# Patient Record
Sex: Male | Born: 1940
Health system: Southern US, Community
[De-identification: ages and names within clinical notes are randomized; demographics above are authoritative.]

## PROBLEM LIST (undated history)

## (undated) DIAGNOSIS — I5032 Chronic diastolic (congestive) heart failure: Secondary | ICD-10-CM

## (undated) DIAGNOSIS — I219 Acute myocardial infarction, unspecified: Secondary | ICD-10-CM

## (undated) DIAGNOSIS — R51 Headache: Secondary | ICD-10-CM

## (undated) DIAGNOSIS — J189 Pneumonia, unspecified organism: Secondary | ICD-10-CM

## (undated) DIAGNOSIS — E8770 Fluid overload, unspecified: Secondary | ICD-10-CM

## (undated) DIAGNOSIS — E118 Type 2 diabetes mellitus with unspecified complications: Secondary | ICD-10-CM

## (undated) DIAGNOSIS — Z7901 Long term (current) use of anticoagulants: Secondary | ICD-10-CM

## (undated) DIAGNOSIS — Z955 Presence of coronary angioplasty implant and graft: Secondary | ICD-10-CM

## (undated) DIAGNOSIS — R519 Headache, unspecified: Secondary | ICD-10-CM

## (undated) DIAGNOSIS — I110 Hypertensive heart disease with heart failure: Secondary | ICD-10-CM

## (undated) DIAGNOSIS — N184 Chronic kidney disease, stage 4 (severe): Secondary | ICD-10-CM

## (undated) DIAGNOSIS — E1165 Type 2 diabetes mellitus with hyperglycemia: Secondary | ICD-10-CM

## (undated) DIAGNOSIS — K219 Gastro-esophageal reflux disease without esophagitis: Secondary | ICD-10-CM

## (undated) DIAGNOSIS — D649 Anemia, unspecified: Secondary | ICD-10-CM

## (undated) DIAGNOSIS — I10 Essential (primary) hypertension: Secondary | ICD-10-CM

## (undated) DIAGNOSIS — IMO0002 Reserved for concepts with insufficient information to code with codable children: Secondary | ICD-10-CM

## (undated) DIAGNOSIS — F418 Other specified anxiety disorders: Secondary | ICD-10-CM

## (undated) DIAGNOSIS — Z951 Presence of aortocoronary bypass graft: Secondary | ICD-10-CM

## (undated) DIAGNOSIS — R112 Nausea with vomiting, unspecified: Secondary | ICD-10-CM

## (undated) DIAGNOSIS — Z9981 Dependence on supplemental oxygen: Secondary | ICD-10-CM

## (undated) DIAGNOSIS — E1100 Type 2 diabetes mellitus with hyperosmolarity without nonketotic hyperglycemic-hyperosmolar coma (NKHHC): Secondary | ICD-10-CM

## (undated) DIAGNOSIS — I25119 Atherosclerotic heart disease of native coronary artery with unspecified angina pectoris: Secondary | ICD-10-CM

## (undated) DIAGNOSIS — G2581 Restless legs syndrome: Secondary | ICD-10-CM

## (undated) DIAGNOSIS — Z9889 Other specified postprocedural states: Secondary | ICD-10-CM

## (undated) DIAGNOSIS — E785 Hyperlipidemia, unspecified: Secondary | ICD-10-CM

## (undated) DIAGNOSIS — I4891 Unspecified atrial fibrillation: Secondary | ICD-10-CM

## (undated) DIAGNOSIS — I214 Non-ST elevation (NSTEMI) myocardial infarction: Secondary | ICD-10-CM

## (undated) DIAGNOSIS — J449 Chronic obstructive pulmonary disease, unspecified: Secondary | ICD-10-CM

## (undated) DIAGNOSIS — J9601 Acute respiratory failure with hypoxia: Secondary | ICD-10-CM

## (undated) DIAGNOSIS — N179 Acute kidney failure, unspecified: Secondary | ICD-10-CM

## (undated) DIAGNOSIS — I340 Nonrheumatic mitral (valve) insufficiency: Secondary | ICD-10-CM

## (undated) DIAGNOSIS — I5042 Chronic combined systolic (congestive) and diastolic (congestive) heart failure: Secondary | ICD-10-CM

## (undated) DIAGNOSIS — E0822 Diabetes mellitus due to underlying condition with diabetic chronic kidney disease: Secondary | ICD-10-CM

## (undated) DIAGNOSIS — M109 Gout, unspecified: Secondary | ICD-10-CM

## (undated) DIAGNOSIS — N400 Enlarged prostate without lower urinary tract symptoms: Secondary | ICD-10-CM

## (undated) DIAGNOSIS — M1990 Unspecified osteoarthritis, unspecified site: Secondary | ICD-10-CM

## (undated) DIAGNOSIS — I2510 Atherosclerotic heart disease of native coronary artery without angina pectoris: Secondary | ICD-10-CM

## (undated) HISTORY — DX: Hypertensive heart disease with heart failure: I11.0

## (undated) HISTORY — DX: Reserved for concepts with insufficient information to code with codable children: IMO0002

## (undated) HISTORY — DX: Acute respiratory failure with hypoxia: J96.01

## (undated) HISTORY — DX: Presence of coronary angioplasty implant and graft: Z95.5

## (undated) HISTORY — DX: Non-ST elevation (NSTEMI) myocardial infarction: I21.4

## (undated) HISTORY — DX: Hyperlipidemia, unspecified: E78.5

## (undated) HISTORY — DX: Pneumonia, unspecified organism: J18.9

## (undated) HISTORY — DX: Acute kidney failure, unspecified: N17.9

## (undated) HISTORY — PX: CATARACT EXTRACTION W/ INTRAOCULAR LENS  IMPLANT, BILATERAL: SHX1307

## (undated) HISTORY — DX: Type 2 diabetes mellitus with hyperglycemia: E11.65

## (undated) HISTORY — DX: Chronic kidney disease, stage 4 (severe): N18.4

## (undated) HISTORY — DX: Fluid overload, unspecified: E87.70

## (undated) HISTORY — DX: Atherosclerotic heart disease of native coronary artery with unspecified angina pectoris: I25.119

## (undated) HISTORY — PX: CORONARY ANGIOPLASTY WITH STENT PLACEMENT: SHX49

## (undated) HISTORY — DX: Chronic diastolic (congestive) heart failure: I50.32

## (undated) HISTORY — DX: Long term (current) use of anticoagulants: Z79.01

## (undated) HISTORY — DX: Presence of aortocoronary bypass graft: Z95.1

## (undated) HISTORY — PX: CORONARY ARTERY BYPASS GRAFT: SHX141

## (undated) HISTORY — DX: Type 2 diabetes mellitus with unspecified complications: E11.8

## (undated) HISTORY — PX: COLONOSCOPY: SHX174

---

## 2012-03-23 DIAGNOSIS — Z955 Presence of coronary angioplasty implant and graft: Secondary | ICD-10-CM

## 2012-03-23 DIAGNOSIS — Z951 Presence of aortocoronary bypass graft: Secondary | ICD-10-CM

## 2012-03-23 HISTORY — DX: Presence of coronary angioplasty implant and graft: Z95.5

## 2012-03-23 HISTORY — DX: Presence of aortocoronary bypass graft: Z95.1

## 2012-03-24 DIAGNOSIS — I509 Heart failure, unspecified: Secondary | ICD-10-CM | POA: Insufficient documentation

## 2015-09-17 DIAGNOSIS — M25652 Stiffness of left hip, not elsewhere classified: Secondary | ICD-10-CM | POA: Diagnosis not present

## 2015-09-17 DIAGNOSIS — M5489 Other dorsalgia: Secondary | ICD-10-CM | POA: Diagnosis not present

## 2015-09-17 DIAGNOSIS — G2581 Restless legs syndrome: Secondary | ICD-10-CM | POA: Diagnosis not present

## 2015-09-17 DIAGNOSIS — M25551 Pain in right hip: Secondary | ICD-10-CM | POA: Diagnosis not present

## 2015-09-20 DIAGNOSIS — I2510 Atherosclerotic heart disease of native coronary artery without angina pectoris: Secondary | ICD-10-CM | POA: Diagnosis not present

## 2015-09-20 DIAGNOSIS — Z6827 Body mass index (BMI) 27.0-27.9, adult: Secondary | ICD-10-CM | POA: Diagnosis not present

## 2015-09-20 DIAGNOSIS — I509 Heart failure, unspecified: Secondary | ICD-10-CM | POA: Diagnosis not present

## 2015-09-20 DIAGNOSIS — D649 Anemia, unspecified: Secondary | ICD-10-CM | POA: Diagnosis not present

## 2015-09-20 DIAGNOSIS — I10 Essential (primary) hypertension: Secondary | ICD-10-CM | POA: Diagnosis not present

## 2015-09-20 DIAGNOSIS — N189 Chronic kidney disease, unspecified: Secondary | ICD-10-CM | POA: Diagnosis not present

## 2015-09-20 DIAGNOSIS — E785 Hyperlipidemia, unspecified: Secondary | ICD-10-CM | POA: Diagnosis not present

## 2015-09-20 DIAGNOSIS — I4891 Unspecified atrial fibrillation: Secondary | ICD-10-CM | POA: Diagnosis not present

## 2015-09-20 DIAGNOSIS — R0602 Shortness of breath: Secondary | ICD-10-CM | POA: Diagnosis not present

## 2015-09-20 DIAGNOSIS — N401 Enlarged prostate with lower urinary tract symptoms: Secondary | ICD-10-CM | POA: Diagnosis not present

## 2015-09-20 DIAGNOSIS — E119 Type 2 diabetes mellitus without complications: Secondary | ICD-10-CM | POA: Diagnosis not present

## 2015-09-24 DIAGNOSIS — I4891 Unspecified atrial fibrillation: Secondary | ICD-10-CM | POA: Diagnosis not present

## 2015-09-24 DIAGNOSIS — I509 Heart failure, unspecified: Secondary | ICD-10-CM | POA: Diagnosis not present

## 2015-09-24 DIAGNOSIS — G2581 Restless legs syndrome: Secondary | ICD-10-CM | POA: Diagnosis not present

## 2015-09-24 DIAGNOSIS — N401 Enlarged prostate with lower urinary tract symptoms: Secondary | ICD-10-CM | POA: Diagnosis not present

## 2015-09-24 DIAGNOSIS — J189 Pneumonia, unspecified organism: Secondary | ICD-10-CM | POA: Diagnosis not present

## 2015-09-24 DIAGNOSIS — I10 Essential (primary) hypertension: Secondary | ICD-10-CM | POA: Diagnosis not present

## 2015-09-24 DIAGNOSIS — B349 Viral infection, unspecified: Secondary | ICD-10-CM | POA: Diagnosis not present

## 2015-09-24 DIAGNOSIS — E119 Type 2 diabetes mellitus without complications: Secondary | ICD-10-CM | POA: Diagnosis not present

## 2015-09-24 DIAGNOSIS — N189 Chronic kidney disease, unspecified: Secondary | ICD-10-CM | POA: Diagnosis not present

## 2015-09-24 DIAGNOSIS — D649 Anemia, unspecified: Secondary | ICD-10-CM | POA: Diagnosis not present

## 2015-09-25 DIAGNOSIS — I252 Old myocardial infarction: Secondary | ICD-10-CM | POA: Diagnosis not present

## 2015-09-25 DIAGNOSIS — Z951 Presence of aortocoronary bypass graft: Secondary | ICD-10-CM | POA: Diagnosis not present

## 2015-09-25 DIAGNOSIS — R0902 Hypoxemia: Secondary | ICD-10-CM | POA: Diagnosis not present

## 2015-09-25 DIAGNOSIS — I249 Acute ischemic heart disease, unspecified: Secondary | ICD-10-CM | POA: Diagnosis not present

## 2015-09-25 DIAGNOSIS — N179 Acute kidney failure, unspecified: Secondary | ICD-10-CM | POA: Diagnosis not present

## 2015-09-25 DIAGNOSIS — R509 Fever, unspecified: Secondary | ICD-10-CM | POA: Diagnosis not present

## 2015-09-25 DIAGNOSIS — E7800 Pure hypercholesterolemia, unspecified: Secondary | ICD-10-CM | POA: Diagnosis not present

## 2015-09-25 DIAGNOSIS — I161 Hypertensive emergency: Secondary | ICD-10-CM | POA: Diagnosis not present

## 2015-09-25 DIAGNOSIS — K219 Gastro-esophageal reflux disease without esophagitis: Secondary | ICD-10-CM | POA: Diagnosis not present

## 2015-09-25 DIAGNOSIS — I509 Heart failure, unspecified: Secondary | ICD-10-CM | POA: Diagnosis not present

## 2015-09-25 DIAGNOSIS — B349 Viral infection, unspecified: Secondary | ICD-10-CM | POA: Diagnosis not present

## 2015-09-25 DIAGNOSIS — E1165 Type 2 diabetes mellitus with hyperglycemia: Secondary | ICD-10-CM | POA: Diagnosis not present

## 2015-09-25 DIAGNOSIS — N400 Enlarged prostate without lower urinary tract symptoms: Secondary | ICD-10-CM | POA: Diagnosis not present

## 2015-09-25 DIAGNOSIS — R079 Chest pain, unspecified: Secondary | ICD-10-CM | POA: Diagnosis not present

## 2015-09-25 DIAGNOSIS — I10 Essential (primary) hypertension: Secondary | ICD-10-CM | POA: Diagnosis not present

## 2015-09-25 DIAGNOSIS — M1990 Unspecified osteoarthritis, unspecified site: Secondary | ICD-10-CM | POA: Diagnosis not present

## 2015-09-25 DIAGNOSIS — I25709 Atherosclerosis of coronary artery bypass graft(s), unspecified, with unspecified angina pectoris: Secondary | ICD-10-CM | POA: Diagnosis not present

## 2015-09-25 DIAGNOSIS — J449 Chronic obstructive pulmonary disease, unspecified: Secondary | ICD-10-CM | POA: Diagnosis not present

## 2015-09-25 DIAGNOSIS — Z79899 Other long term (current) drug therapy: Secondary | ICD-10-CM | POA: Diagnosis not present

## 2015-09-25 DIAGNOSIS — E785 Hyperlipidemia, unspecified: Secondary | ICD-10-CM | POA: Diagnosis not present

## 2015-09-26 DIAGNOSIS — R05 Cough: Secondary | ICD-10-CM | POA: Diagnosis not present

## 2015-09-26 DIAGNOSIS — R509 Fever, unspecified: Secondary | ICD-10-CM | POA: Diagnosis not present

## 2015-09-26 DIAGNOSIS — I161 Hypertensive emergency: Secondary | ICD-10-CM | POA: Diagnosis not present

## 2015-09-26 DIAGNOSIS — R0902 Hypoxemia: Secondary | ICD-10-CM | POA: Diagnosis not present

## 2015-09-26 DIAGNOSIS — B349 Viral infection, unspecified: Secondary | ICD-10-CM | POA: Diagnosis not present

## 2015-09-26 DIAGNOSIS — I249 Acute ischemic heart disease, unspecified: Secondary | ICD-10-CM | POA: Diagnosis not present

## 2015-09-27 DIAGNOSIS — E1165 Type 2 diabetes mellitus with hyperglycemia: Secondary | ICD-10-CM | POA: Diagnosis not present

## 2015-09-27 DIAGNOSIS — R079 Chest pain, unspecified: Secondary | ICD-10-CM | POA: Diagnosis not present

## 2015-09-27 DIAGNOSIS — R509 Fever, unspecified: Secondary | ICD-10-CM | POA: Diagnosis not present

## 2015-09-27 DIAGNOSIS — I10 Essential (primary) hypertension: Secondary | ICD-10-CM | POA: Diagnosis not present

## 2015-09-28 DIAGNOSIS — I25119 Atherosclerotic heart disease of native coronary artery with unspecified angina pectoris: Secondary | ICD-10-CM | POA: Diagnosis not present

## 2015-09-28 DIAGNOSIS — R079 Chest pain, unspecified: Secondary | ICD-10-CM | POA: Diagnosis not present

## 2015-09-28 DIAGNOSIS — I517 Cardiomegaly: Secondary | ICD-10-CM | POA: Diagnosis not present

## 2015-09-28 DIAGNOSIS — I340 Nonrheumatic mitral (valve) insufficiency: Secondary | ICD-10-CM | POA: Diagnosis not present

## 2015-09-28 DIAGNOSIS — N179 Acute kidney failure, unspecified: Secondary | ICD-10-CM | POA: Diagnosis not present

## 2015-09-28 DIAGNOSIS — I10 Essential (primary) hypertension: Secondary | ICD-10-CM | POA: Diagnosis not present

## 2015-09-28 DIAGNOSIS — E1165 Type 2 diabetes mellitus with hyperglycemia: Secondary | ICD-10-CM | POA: Diagnosis not present

## 2015-09-28 DIAGNOSIS — R509 Fever, unspecified: Secondary | ICD-10-CM | POA: Diagnosis not present

## 2015-10-03 DIAGNOSIS — N401 Enlarged prostate with lower urinary tract symptoms: Secondary | ICD-10-CM | POA: Diagnosis not present

## 2015-10-03 DIAGNOSIS — I509 Heart failure, unspecified: Secondary | ICD-10-CM | POA: Diagnosis not present

## 2015-10-03 DIAGNOSIS — E119 Type 2 diabetes mellitus without complications: Secondary | ICD-10-CM | POA: Diagnosis not present

## 2015-10-03 DIAGNOSIS — J189 Pneumonia, unspecified organism: Secondary | ICD-10-CM | POA: Diagnosis not present

## 2015-10-03 DIAGNOSIS — I10 Essential (primary) hypertension: Secondary | ICD-10-CM | POA: Diagnosis not present

## 2015-10-03 DIAGNOSIS — M109 Gout, unspecified: Secondary | ICD-10-CM | POA: Diagnosis not present

## 2015-10-03 DIAGNOSIS — I4891 Unspecified atrial fibrillation: Secondary | ICD-10-CM | POA: Diagnosis not present

## 2015-10-03 DIAGNOSIS — Z6827 Body mass index (BMI) 27.0-27.9, adult: Secondary | ICD-10-CM | POA: Diagnosis not present

## 2015-10-03 DIAGNOSIS — N189 Chronic kidney disease, unspecified: Secondary | ICD-10-CM | POA: Diagnosis not present

## 2015-10-03 DIAGNOSIS — I2510 Atherosclerotic heart disease of native coronary artery without angina pectoris: Secondary | ICD-10-CM | POA: Diagnosis not present

## 2015-10-11 DIAGNOSIS — Z6827 Body mass index (BMI) 27.0-27.9, adult: Secondary | ICD-10-CM | POA: Diagnosis not present

## 2015-10-11 DIAGNOSIS — N189 Chronic kidney disease, unspecified: Secondary | ICD-10-CM | POA: Diagnosis not present

## 2015-10-11 DIAGNOSIS — I4891 Unspecified atrial fibrillation: Secondary | ICD-10-CM | POA: Diagnosis not present

## 2015-10-11 DIAGNOSIS — I509 Heart failure, unspecified: Secondary | ICD-10-CM | POA: Diagnosis not present

## 2015-10-11 DIAGNOSIS — I10 Essential (primary) hypertension: Secondary | ICD-10-CM | POA: Diagnosis not present

## 2015-10-11 DIAGNOSIS — M109 Gout, unspecified: Secondary | ICD-10-CM | POA: Diagnosis not present

## 2015-10-11 DIAGNOSIS — N401 Enlarged prostate with lower urinary tract symptoms: Secondary | ICD-10-CM | POA: Diagnosis not present

## 2015-12-04 DIAGNOSIS — E663 Overweight: Secondary | ICD-10-CM | POA: Diagnosis not present

## 2015-12-04 DIAGNOSIS — J301 Allergic rhinitis due to pollen: Secondary | ICD-10-CM | POA: Diagnosis not present

## 2015-12-04 DIAGNOSIS — J189 Pneumonia, unspecified organism: Secondary | ICD-10-CM | POA: Diagnosis not present

## 2015-12-04 DIAGNOSIS — J9801 Acute bronchospasm: Secondary | ICD-10-CM | POA: Diagnosis not present

## 2015-12-04 DIAGNOSIS — Z6827 Body mass index (BMI) 27.0-27.9, adult: Secondary | ICD-10-CM | POA: Diagnosis not present

## 2015-12-06 DIAGNOSIS — I2510 Atherosclerotic heart disease of native coronary artery without angina pectoris: Secondary | ICD-10-CM | POA: Diagnosis not present

## 2015-12-06 DIAGNOSIS — R0989 Other specified symptoms and signs involving the circulatory and respiratory systems: Secondary | ICD-10-CM | POA: Diagnosis not present

## 2015-12-06 DIAGNOSIS — Z7901 Long term (current) use of anticoagulants: Secondary | ICD-10-CM | POA: Diagnosis not present

## 2015-12-06 DIAGNOSIS — I129 Hypertensive chronic kidney disease with stage 1 through stage 4 chronic kidney disease, or unspecified chronic kidney disease: Secondary | ICD-10-CM | POA: Diagnosis not present

## 2015-12-06 DIAGNOSIS — I252 Old myocardial infarction: Secondary | ICD-10-CM | POA: Diagnosis not present

## 2015-12-06 DIAGNOSIS — J181 Lobar pneumonia, unspecified organism: Secondary | ICD-10-CM | POA: Diagnosis not present

## 2015-12-06 DIAGNOSIS — E1122 Type 2 diabetes mellitus with diabetic chronic kidney disease: Secondary | ICD-10-CM | POA: Diagnosis not present

## 2015-12-06 DIAGNOSIS — M1990 Unspecified osteoarthritis, unspecified site: Secondary | ICD-10-CM | POA: Diagnosis not present

## 2015-12-06 DIAGNOSIS — G2581 Restless legs syndrome: Secondary | ICD-10-CM | POA: Diagnosis not present

## 2015-12-06 DIAGNOSIS — J440 Chronic obstructive pulmonary disease with acute lower respiratory infection: Secondary | ICD-10-CM | POA: Diagnosis not present

## 2015-12-06 DIAGNOSIS — N400 Enlarged prostate without lower urinary tract symptoms: Secondary | ICD-10-CM | POA: Diagnosis not present

## 2015-12-06 DIAGNOSIS — E1165 Type 2 diabetes mellitus with hyperglycemia: Secondary | ICD-10-CM | POA: Diagnosis not present

## 2015-12-06 DIAGNOSIS — J984 Other disorders of lung: Secondary | ICD-10-CM | POA: Diagnosis not present

## 2015-12-06 DIAGNOSIS — I5031 Acute diastolic (congestive) heart failure: Secondary | ICD-10-CM | POA: Diagnosis not present

## 2015-12-06 DIAGNOSIS — I10 Essential (primary) hypertension: Secondary | ICD-10-CM | POA: Diagnosis not present

## 2015-12-06 DIAGNOSIS — M791 Myalgia: Secondary | ICD-10-CM | POA: Diagnosis not present

## 2015-12-06 DIAGNOSIS — I509 Heart failure, unspecified: Secondary | ICD-10-CM | POA: Diagnosis not present

## 2015-12-06 DIAGNOSIS — Z95818 Presence of other cardiac implants and grafts: Secondary | ICD-10-CM | POA: Diagnosis not present

## 2015-12-06 DIAGNOSIS — I5033 Acute on chronic diastolic (congestive) heart failure: Secondary | ICD-10-CM | POA: Diagnosis not present

## 2015-12-06 DIAGNOSIS — R0602 Shortness of breath: Secondary | ICD-10-CM | POA: Diagnosis not present

## 2015-12-06 DIAGNOSIS — E875 Hyperkalemia: Secondary | ICD-10-CM | POA: Diagnosis not present

## 2015-12-06 DIAGNOSIS — J9601 Acute respiratory failure with hypoxia: Secondary | ICD-10-CM | POA: Diagnosis not present

## 2015-12-06 DIAGNOSIS — D509 Iron deficiency anemia, unspecified: Secondary | ICD-10-CM | POA: Diagnosis not present

## 2015-12-06 DIAGNOSIS — N189 Chronic kidney disease, unspecified: Secondary | ICD-10-CM | POA: Diagnosis not present

## 2015-12-06 DIAGNOSIS — N179 Acute kidney failure, unspecified: Secondary | ICD-10-CM | POA: Diagnosis not present

## 2015-12-06 DIAGNOSIS — J441 Chronic obstructive pulmonary disease with (acute) exacerbation: Secondary | ICD-10-CM | POA: Diagnosis not present

## 2015-12-06 DIAGNOSIS — R0600 Dyspnea, unspecified: Secondary | ICD-10-CM | POA: Diagnosis not present

## 2015-12-06 DIAGNOSIS — E785 Hyperlipidemia, unspecified: Secondary | ICD-10-CM | POA: Diagnosis not present

## 2015-12-06 DIAGNOSIS — R918 Other nonspecific abnormal finding of lung field: Secondary | ICD-10-CM | POA: Diagnosis not present

## 2015-12-06 DIAGNOSIS — Z87891 Personal history of nicotine dependence: Secondary | ICD-10-CM | POA: Diagnosis not present

## 2015-12-06 DIAGNOSIS — I480 Paroxysmal atrial fibrillation: Secondary | ICD-10-CM | POA: Diagnosis not present

## 2015-12-06 DIAGNOSIS — Z794 Long term (current) use of insulin: Secondary | ICD-10-CM | POA: Diagnosis not present

## 2015-12-06 DIAGNOSIS — I255 Ischemic cardiomyopathy: Secondary | ICD-10-CM | POA: Diagnosis not present

## 2015-12-06 DIAGNOSIS — N289 Disorder of kidney and ureter, unspecified: Secondary | ICD-10-CM | POA: Diagnosis not present

## 2015-12-06 DIAGNOSIS — J189 Pneumonia, unspecified organism: Secondary | ICD-10-CM | POA: Diagnosis not present

## 2015-12-06 DIAGNOSIS — R7989 Other specified abnormal findings of blood chemistry: Secondary | ICD-10-CM | POA: Diagnosis not present

## 2015-12-07 DIAGNOSIS — I5033 Acute on chronic diastolic (congestive) heart failure: Secondary | ICD-10-CM | POA: Diagnosis not present

## 2015-12-07 DIAGNOSIS — R918 Other nonspecific abnormal finding of lung field: Secondary | ICD-10-CM | POA: Diagnosis not present

## 2015-12-07 DIAGNOSIS — N179 Acute kidney failure, unspecified: Secondary | ICD-10-CM | POA: Diagnosis not present

## 2015-12-07 DIAGNOSIS — J9601 Acute respiratory failure with hypoxia: Secondary | ICD-10-CM | POA: Diagnosis not present

## 2015-12-07 DIAGNOSIS — R0989 Other specified symptoms and signs involving the circulatory and respiratory systems: Secondary | ICD-10-CM | POA: Diagnosis not present

## 2015-12-07 DIAGNOSIS — J189 Pneumonia, unspecified organism: Secondary | ICD-10-CM | POA: Diagnosis not present

## 2015-12-08 DIAGNOSIS — I5033 Acute on chronic diastolic (congestive) heart failure: Secondary | ICD-10-CM | POA: Diagnosis not present

## 2015-12-08 DIAGNOSIS — J189 Pneumonia, unspecified organism: Secondary | ICD-10-CM | POA: Diagnosis not present

## 2015-12-08 DIAGNOSIS — J9601 Acute respiratory failure with hypoxia: Secondary | ICD-10-CM | POA: Diagnosis not present

## 2015-12-08 DIAGNOSIS — N179 Acute kidney failure, unspecified: Secondary | ICD-10-CM | POA: Diagnosis not present

## 2015-12-17 DIAGNOSIS — I509 Heart failure, unspecified: Secondary | ICD-10-CM | POA: Diagnosis not present

## 2015-12-17 DIAGNOSIS — E119 Type 2 diabetes mellitus without complications: Secondary | ICD-10-CM | POA: Diagnosis not present

## 2015-12-17 DIAGNOSIS — J9801 Acute bronchospasm: Secondary | ICD-10-CM | POA: Diagnosis not present

## 2015-12-17 DIAGNOSIS — G2581 Restless legs syndrome: Secondary | ICD-10-CM | POA: Diagnosis not present

## 2015-12-17 DIAGNOSIS — I4891 Unspecified atrial fibrillation: Secondary | ICD-10-CM | POA: Diagnosis not present

## 2015-12-17 DIAGNOSIS — I2510 Atherosclerotic heart disease of native coronary artery without angina pectoris: Secondary | ICD-10-CM | POA: Diagnosis not present

## 2015-12-17 DIAGNOSIS — Z6828 Body mass index (BMI) 28.0-28.9, adult: Secondary | ICD-10-CM | POA: Diagnosis not present

## 2016-01-02 DIAGNOSIS — Z6826 Body mass index (BMI) 26.0-26.9, adult: Secondary | ICD-10-CM | POA: Diagnosis not present

## 2016-01-02 DIAGNOSIS — E119 Type 2 diabetes mellitus without complications: Secondary | ICD-10-CM | POA: Diagnosis not present

## 2016-01-02 DIAGNOSIS — E785 Hyperlipidemia, unspecified: Secondary | ICD-10-CM | POA: Diagnosis not present

## 2016-01-02 DIAGNOSIS — I2510 Atherosclerotic heart disease of native coronary artery without angina pectoris: Secondary | ICD-10-CM | POA: Diagnosis not present

## 2016-01-02 DIAGNOSIS — I4891 Unspecified atrial fibrillation: Secondary | ICD-10-CM | POA: Diagnosis not present

## 2016-01-02 DIAGNOSIS — I10 Essential (primary) hypertension: Secondary | ICD-10-CM | POA: Diagnosis not present

## 2016-01-06 DIAGNOSIS — I482 Chronic atrial fibrillation: Secondary | ICD-10-CM | POA: Diagnosis not present

## 2016-01-06 DIAGNOSIS — N184 Chronic kidney disease, stage 4 (severe): Secondary | ICD-10-CM | POA: Diagnosis not present

## 2016-01-06 DIAGNOSIS — I252 Old myocardial infarction: Secondary | ICD-10-CM | POA: Diagnosis not present

## 2016-01-06 DIAGNOSIS — J189 Pneumonia, unspecified organism: Secondary | ICD-10-CM | POA: Diagnosis not present

## 2016-01-06 DIAGNOSIS — R0600 Dyspnea, unspecified: Secondary | ICD-10-CM | POA: Diagnosis not present

## 2016-01-06 DIAGNOSIS — R739 Hyperglycemia, unspecified: Secondary | ICD-10-CM | POA: Diagnosis not present

## 2016-01-06 DIAGNOSIS — R7309 Other abnormal glucose: Secondary | ICD-10-CM | POA: Diagnosis not present

## 2016-01-06 DIAGNOSIS — E1165 Type 2 diabetes mellitus with hyperglycemia: Secondary | ICD-10-CM | POA: Diagnosis not present

## 2016-01-06 DIAGNOSIS — K219 Gastro-esophageal reflux disease without esophagitis: Secondary | ICD-10-CM | POA: Diagnosis not present

## 2016-01-06 DIAGNOSIS — Z87891 Personal history of nicotine dependence: Secondary | ICD-10-CM | POA: Diagnosis not present

## 2016-01-06 DIAGNOSIS — I214 Non-ST elevation (NSTEMI) myocardial infarction: Secondary | ICD-10-CM | POA: Diagnosis not present

## 2016-01-06 DIAGNOSIS — Z951 Presence of aortocoronary bypass graft: Secondary | ICD-10-CM | POA: Diagnosis not present

## 2016-01-06 DIAGNOSIS — R0602 Shortness of breath: Secondary | ICD-10-CM | POA: Diagnosis not present

## 2016-01-06 DIAGNOSIS — R7881 Bacteremia: Secondary | ICD-10-CM | POA: Diagnosis not present

## 2016-01-06 DIAGNOSIS — I501 Left ventricular failure: Secondary | ICD-10-CM | POA: Diagnosis not present

## 2016-01-06 DIAGNOSIS — E785 Hyperlipidemia, unspecified: Secondary | ICD-10-CM | POA: Diagnosis not present

## 2016-01-06 DIAGNOSIS — I130 Hypertensive heart and chronic kidney disease with heart failure and stage 1 through stage 4 chronic kidney disease, or unspecified chronic kidney disease: Secondary | ICD-10-CM | POA: Diagnosis not present

## 2016-01-06 DIAGNOSIS — I080 Rheumatic disorders of both mitral and aortic valves: Secondary | ICD-10-CM | POA: Diagnosis not present

## 2016-01-06 DIAGNOSIS — J9621 Acute and chronic respiratory failure with hypoxia: Secondary | ICD-10-CM | POA: Diagnosis not present

## 2016-01-06 DIAGNOSIS — I2510 Atherosclerotic heart disease of native coronary artery without angina pectoris: Secondary | ICD-10-CM | POA: Diagnosis not present

## 2016-01-06 DIAGNOSIS — N179 Acute kidney failure, unspecified: Secondary | ICD-10-CM | POA: Diagnosis not present

## 2016-01-06 DIAGNOSIS — E1122 Type 2 diabetes mellitus with diabetic chronic kidney disease: Secondary | ICD-10-CM | POA: Diagnosis not present

## 2016-01-06 DIAGNOSIS — J440 Chronic obstructive pulmonary disease with acute lower respiratory infection: Secondary | ICD-10-CM | POA: Diagnosis not present

## 2016-01-06 DIAGNOSIS — I5033 Acute on chronic diastolic (congestive) heart failure: Secondary | ICD-10-CM | POA: Diagnosis not present

## 2016-01-06 DIAGNOSIS — J159 Unspecified bacterial pneumonia: Secondary | ICD-10-CM | POA: Diagnosis not present

## 2016-01-06 DIAGNOSIS — N138 Other obstructive and reflux uropathy: Secondary | ICD-10-CM | POA: Diagnosis not present

## 2016-01-06 DIAGNOSIS — D509 Iron deficiency anemia, unspecified: Secondary | ICD-10-CM | POA: Diagnosis not present

## 2016-01-06 DIAGNOSIS — E538 Deficiency of other specified B group vitamins: Secondary | ICD-10-CM | POA: Diagnosis not present

## 2016-01-06 DIAGNOSIS — J449 Chronic obstructive pulmonary disease, unspecified: Secondary | ICD-10-CM | POA: Diagnosis not present

## 2016-01-07 DIAGNOSIS — I4891 Unspecified atrial fibrillation: Secondary | ICD-10-CM | POA: Diagnosis not present

## 2016-01-07 DIAGNOSIS — I1310 Hypertensive heart and chronic kidney disease without heart failure, with stage 1 through stage 4 chronic kidney disease, or unspecified chronic kidney disease: Secondary | ICD-10-CM | POA: Diagnosis not present

## 2016-01-07 DIAGNOSIS — N184 Chronic kidney disease, stage 4 (severe): Secondary | ICD-10-CM | POA: Diagnosis not present

## 2016-01-07 DIAGNOSIS — Z951 Presence of aortocoronary bypass graft: Secondary | ICD-10-CM | POA: Diagnosis not present

## 2016-01-07 DIAGNOSIS — I361 Nonrheumatic tricuspid (valve) insufficiency: Secondary | ICD-10-CM | POA: Diagnosis not present

## 2016-01-07 DIAGNOSIS — R0602 Shortness of breath: Secondary | ICD-10-CM | POA: Diagnosis not present

## 2016-01-07 DIAGNOSIS — I214 Non-ST elevation (NSTEMI) myocardial infarction: Secondary | ICD-10-CM | POA: Diagnosis not present

## 2016-01-07 DIAGNOSIS — I272 Other secondary pulmonary hypertension: Secondary | ICD-10-CM | POA: Diagnosis not present

## 2016-01-07 DIAGNOSIS — I2510 Atherosclerotic heart disease of native coronary artery without angina pectoris: Secondary | ICD-10-CM | POA: Diagnosis not present

## 2016-01-07 DIAGNOSIS — I222 Subsequent non-ST elevation (NSTEMI) myocardial infarction: Secondary | ICD-10-CM | POA: Diagnosis not present

## 2016-01-07 DIAGNOSIS — J9690 Respiratory failure, unspecified, unspecified whether with hypoxia or hypercapnia: Secondary | ICD-10-CM | POA: Diagnosis not present

## 2016-01-07 DIAGNOSIS — I517 Cardiomegaly: Secondary | ICD-10-CM | POA: Diagnosis not present

## 2016-01-08 DIAGNOSIS — R000 Tachycardia, unspecified: Secondary | ICD-10-CM | POA: Diagnosis not present

## 2016-01-08 DIAGNOSIS — I2510 Atherosclerotic heart disease of native coronary artery without angina pectoris: Secondary | ICD-10-CM | POA: Diagnosis not present

## 2016-01-08 DIAGNOSIS — Z951 Presence of aortocoronary bypass graft: Secondary | ICD-10-CM | POA: Diagnosis not present

## 2016-01-08 DIAGNOSIS — I214 Non-ST elevation (NSTEMI) myocardial infarction: Secondary | ICD-10-CM | POA: Diagnosis not present

## 2016-01-08 DIAGNOSIS — J9690 Respiratory failure, unspecified, unspecified whether with hypoxia or hypercapnia: Secondary | ICD-10-CM | POA: Diagnosis not present

## 2016-01-09 DIAGNOSIS — R079 Chest pain, unspecified: Secondary | ICD-10-CM | POA: Diagnosis not present

## 2016-01-09 DIAGNOSIS — J90 Pleural effusion, not elsewhere classified: Secondary | ICD-10-CM | POA: Diagnosis not present

## 2016-01-10 DIAGNOSIS — I214 Non-ST elevation (NSTEMI) myocardial infarction: Secondary | ICD-10-CM | POA: Diagnosis not present

## 2016-01-10 DIAGNOSIS — N184 Chronic kidney disease, stage 4 (severe): Secondary | ICD-10-CM | POA: Diagnosis not present

## 2016-01-10 DIAGNOSIS — I2510 Atherosclerotic heart disease of native coronary artery without angina pectoris: Secondary | ICD-10-CM | POA: Diagnosis not present

## 2016-01-10 DIAGNOSIS — J9690 Respiratory failure, unspecified, unspecified whether with hypoxia or hypercapnia: Secondary | ICD-10-CM | POA: Diagnosis not present

## 2016-01-10 DIAGNOSIS — Z951 Presence of aortocoronary bypass graft: Secondary | ICD-10-CM | POA: Diagnosis not present

## 2016-01-11 DIAGNOSIS — N184 Chronic kidney disease, stage 4 (severe): Secondary | ICD-10-CM | POA: Diagnosis not present

## 2016-01-11 DIAGNOSIS — I2510 Atherosclerotic heart disease of native coronary artery without angina pectoris: Secondary | ICD-10-CM | POA: Diagnosis not present

## 2016-01-11 DIAGNOSIS — I214 Non-ST elevation (NSTEMI) myocardial infarction: Secondary | ICD-10-CM | POA: Diagnosis not present

## 2016-01-11 DIAGNOSIS — I5043 Acute on chronic combined systolic (congestive) and diastolic (congestive) heart failure: Secondary | ICD-10-CM | POA: Diagnosis not present

## 2016-01-11 DIAGNOSIS — J9690 Respiratory failure, unspecified, unspecified whether with hypoxia or hypercapnia: Secondary | ICD-10-CM | POA: Diagnosis not present

## 2016-01-11 DIAGNOSIS — Z951 Presence of aortocoronary bypass graft: Secondary | ICD-10-CM | POA: Diagnosis not present

## 2016-01-12 DIAGNOSIS — E8779 Other fluid overload: Secondary | ICD-10-CM | POA: Diagnosis not present

## 2016-01-12 DIAGNOSIS — I480 Paroxysmal atrial fibrillation: Secondary | ICD-10-CM | POA: Diagnosis not present

## 2016-01-12 DIAGNOSIS — N184 Chronic kidney disease, stage 4 (severe): Secondary | ICD-10-CM | POA: Diagnosis not present

## 2016-01-12 DIAGNOSIS — I214 Non-ST elevation (NSTEMI) myocardial infarction: Secondary | ICD-10-CM | POA: Diagnosis not present

## 2016-01-12 DIAGNOSIS — Z951 Presence of aortocoronary bypass graft: Secondary | ICD-10-CM | POA: Diagnosis not present

## 2016-01-12 DIAGNOSIS — J9690 Respiratory failure, unspecified, unspecified whether with hypoxia or hypercapnia: Secondary | ICD-10-CM | POA: Diagnosis not present

## 2016-01-12 DIAGNOSIS — I5043 Acute on chronic combined systolic (congestive) and diastolic (congestive) heart failure: Secondary | ICD-10-CM | POA: Diagnosis not present

## 2016-01-12 DIAGNOSIS — I2510 Atherosclerotic heart disease of native coronary artery without angina pectoris: Secondary | ICD-10-CM | POA: Diagnosis not present

## 2016-01-13 DIAGNOSIS — Z7901 Long term (current) use of anticoagulants: Secondary | ICD-10-CM | POA: Diagnosis not present

## 2016-01-13 DIAGNOSIS — I130 Hypertensive heart and chronic kidney disease with heart failure and stage 1 through stage 4 chronic kidney disease, or unspecified chronic kidney disease: Secondary | ICD-10-CM | POA: Diagnosis not present

## 2016-01-13 DIAGNOSIS — N184 Chronic kidney disease, stage 4 (severe): Secondary | ICD-10-CM | POA: Diagnosis not present

## 2016-01-13 DIAGNOSIS — I5043 Acute on chronic combined systolic (congestive) and diastolic (congestive) heart failure: Secondary | ICD-10-CM | POA: Diagnosis not present

## 2016-01-13 DIAGNOSIS — Z951 Presence of aortocoronary bypass graft: Secondary | ICD-10-CM | POA: Diagnosis not present

## 2016-01-13 DIAGNOSIS — I214 Non-ST elevation (NSTEMI) myocardial infarction: Secondary | ICD-10-CM | POA: Diagnosis not present

## 2016-01-14 DIAGNOSIS — Z951 Presence of aortocoronary bypass graft: Secondary | ICD-10-CM | POA: Diagnosis not present

## 2016-01-14 DIAGNOSIS — I214 Non-ST elevation (NSTEMI) myocardial infarction: Secondary | ICD-10-CM | POA: Diagnosis not present

## 2016-01-14 DIAGNOSIS — N184 Chronic kidney disease, stage 4 (severe): Secondary | ICD-10-CM | POA: Diagnosis not present

## 2016-01-14 DIAGNOSIS — I110 Hypertensive heart disease with heart failure: Secondary | ICD-10-CM | POA: Diagnosis not present

## 2016-01-14 DIAGNOSIS — I5043 Acute on chronic combined systolic (congestive) and diastolic (congestive) heart failure: Secondary | ICD-10-CM | POA: Diagnosis not present

## 2016-01-14 DIAGNOSIS — Z7901 Long term (current) use of anticoagulants: Secondary | ICD-10-CM | POA: Diagnosis not present

## 2016-01-15 DIAGNOSIS — I214 Non-ST elevation (NSTEMI) myocardial infarction: Secondary | ICD-10-CM | POA: Diagnosis not present

## 2016-01-15 DIAGNOSIS — I110 Hypertensive heart disease with heart failure: Secondary | ICD-10-CM | POA: Diagnosis not present

## 2016-01-15 DIAGNOSIS — Z7901 Long term (current) use of anticoagulants: Secondary | ICD-10-CM | POA: Diagnosis not present

## 2016-01-15 DIAGNOSIS — Z951 Presence of aortocoronary bypass graft: Secondary | ICD-10-CM | POA: Diagnosis not present

## 2016-01-15 DIAGNOSIS — N184 Chronic kidney disease, stage 4 (severe): Secondary | ICD-10-CM | POA: Diagnosis not present

## 2016-01-15 DIAGNOSIS — N179 Acute kidney failure, unspecified: Secondary | ICD-10-CM | POA: Diagnosis not present

## 2016-01-16 DIAGNOSIS — I110 Hypertensive heart disease with heart failure: Secondary | ICD-10-CM | POA: Diagnosis not present

## 2016-01-16 DIAGNOSIS — Z7901 Long term (current) use of anticoagulants: Secondary | ICD-10-CM | POA: Diagnosis not present

## 2016-01-16 DIAGNOSIS — N184 Chronic kidney disease, stage 4 (severe): Secondary | ICD-10-CM | POA: Diagnosis not present

## 2016-01-16 DIAGNOSIS — Z951 Presence of aortocoronary bypass graft: Secondary | ICD-10-CM | POA: Diagnosis not present

## 2016-01-16 DIAGNOSIS — I5043 Acute on chronic combined systolic (congestive) and diastolic (congestive) heart failure: Secondary | ICD-10-CM | POA: Diagnosis not present

## 2016-01-16 DIAGNOSIS — I214 Non-ST elevation (NSTEMI) myocardial infarction: Secondary | ICD-10-CM | POA: Diagnosis not present

## 2016-01-17 DIAGNOSIS — M109 Gout, unspecified: Secondary | ICD-10-CM | POA: Diagnosis not present

## 2016-01-17 DIAGNOSIS — N189 Chronic kidney disease, unspecified: Secondary | ICD-10-CM | POA: Diagnosis not present

## 2016-01-17 DIAGNOSIS — I509 Heart failure, unspecified: Secondary | ICD-10-CM | POA: Diagnosis not present

## 2016-01-17 DIAGNOSIS — K210 Gastro-esophageal reflux disease with esophagitis: Secondary | ICD-10-CM | POA: Diagnosis not present

## 2016-01-17 DIAGNOSIS — J301 Allergic rhinitis due to pollen: Secondary | ICD-10-CM | POA: Diagnosis not present

## 2016-01-17 DIAGNOSIS — I10 Essential (primary) hypertension: Secondary | ICD-10-CM | POA: Diagnosis not present

## 2016-01-17 DIAGNOSIS — N401 Enlarged prostate with lower urinary tract symptoms: Secondary | ICD-10-CM | POA: Diagnosis not present

## 2016-01-17 DIAGNOSIS — G2581 Restless legs syndrome: Secondary | ICD-10-CM | POA: Diagnosis not present

## 2016-01-17 DIAGNOSIS — E119 Type 2 diabetes mellitus without complications: Secondary | ICD-10-CM | POA: Diagnosis not present

## 2016-01-17 DIAGNOSIS — E785 Hyperlipidemia, unspecified: Secondary | ICD-10-CM | POA: Diagnosis not present

## 2016-01-17 DIAGNOSIS — I4891 Unspecified atrial fibrillation: Secondary | ICD-10-CM | POA: Diagnosis not present

## 2016-01-17 DIAGNOSIS — I2510 Atherosclerotic heart disease of native coronary artery without angina pectoris: Secondary | ICD-10-CM | POA: Diagnosis not present

## 2016-01-20 DIAGNOSIS — I509 Heart failure, unspecified: Secondary | ICD-10-CM | POA: Diagnosis not present

## 2016-01-20 DIAGNOSIS — D649 Anemia, unspecified: Secondary | ICD-10-CM | POA: Diagnosis not present

## 2016-01-20 DIAGNOSIS — R0602 Shortness of breath: Secondary | ICD-10-CM | POA: Diagnosis not present

## 2016-01-22 DIAGNOSIS — I5032 Chronic diastolic (congestive) heart failure: Secondary | ICD-10-CM

## 2016-01-22 DIAGNOSIS — Z7901 Long term (current) use of anticoagulants: Secondary | ICD-10-CM

## 2016-01-22 DIAGNOSIS — I110 Hypertensive heart disease with heart failure: Secondary | ICD-10-CM | POA: Insufficient documentation

## 2016-01-22 HISTORY — DX: Chronic diastolic (congestive) heart failure: I50.32

## 2016-01-22 HISTORY — DX: Hypertensive heart disease with heart failure: I11.0

## 2016-01-22 HISTORY — DX: Long term (current) use of anticoagulants: Z79.01

## 2016-01-23 DIAGNOSIS — N189 Chronic kidney disease, unspecified: Secondary | ICD-10-CM | POA: Diagnosis not present

## 2016-01-23 DIAGNOSIS — M109 Gout, unspecified: Secondary | ICD-10-CM | POA: Diagnosis not present

## 2016-01-23 DIAGNOSIS — I110 Hypertensive heart disease with heart failure: Secondary | ICD-10-CM | POA: Diagnosis not present

## 2016-01-23 DIAGNOSIS — J301 Allergic rhinitis due to pollen: Secondary | ICD-10-CM | POA: Diagnosis not present

## 2016-01-23 DIAGNOSIS — N401 Enlarged prostate with lower urinary tract symptoms: Secondary | ICD-10-CM | POA: Diagnosis not present

## 2016-01-23 DIAGNOSIS — I509 Heart failure, unspecified: Secondary | ICD-10-CM | POA: Diagnosis not present

## 2016-01-23 DIAGNOSIS — G2581 Restless legs syndrome: Secondary | ICD-10-CM | POA: Diagnosis not present

## 2016-01-23 DIAGNOSIS — I2510 Atherosclerotic heart disease of native coronary artery without angina pectoris: Secondary | ICD-10-CM | POA: Diagnosis not present

## 2016-01-23 DIAGNOSIS — I25119 Atherosclerotic heart disease of native coronary artery with unspecified angina pectoris: Secondary | ICD-10-CM | POA: Diagnosis not present

## 2016-01-23 DIAGNOSIS — I10 Essential (primary) hypertension: Secondary | ICD-10-CM | POA: Diagnosis not present

## 2016-01-23 DIAGNOSIS — K210 Gastro-esophageal reflux disease with esophagitis: Secondary | ICD-10-CM | POA: Diagnosis not present

## 2016-01-23 DIAGNOSIS — I5032 Chronic diastolic (congestive) heart failure: Secondary | ICD-10-CM | POA: Diagnosis not present

## 2016-01-23 DIAGNOSIS — Z7901 Long term (current) use of anticoagulants: Secondary | ICD-10-CM | POA: Diagnosis not present

## 2016-01-23 DIAGNOSIS — E119 Type 2 diabetes mellitus without complications: Secondary | ICD-10-CM | POA: Diagnosis not present

## 2016-01-23 DIAGNOSIS — I4891 Unspecified atrial fibrillation: Secondary | ICD-10-CM | POA: Diagnosis not present

## 2016-01-23 DIAGNOSIS — N184 Chronic kidney disease, stage 4 (severe): Secondary | ICD-10-CM | POA: Diagnosis not present

## 2016-02-04 DIAGNOSIS — Z6825 Body mass index (BMI) 25.0-25.9, adult: Secondary | ICD-10-CM | POA: Diagnosis not present

## 2016-02-04 DIAGNOSIS — I509 Heart failure, unspecified: Secondary | ICD-10-CM | POA: Diagnosis not present

## 2016-02-04 DIAGNOSIS — M109 Gout, unspecified: Secondary | ICD-10-CM | POA: Diagnosis not present

## 2016-02-04 DIAGNOSIS — I10 Essential (primary) hypertension: Secondary | ICD-10-CM | POA: Diagnosis not present

## 2016-02-04 DIAGNOSIS — N401 Enlarged prostate with lower urinary tract symptoms: Secondary | ICD-10-CM | POA: Diagnosis not present

## 2016-02-04 DIAGNOSIS — Z9181 History of falling: Secondary | ICD-10-CM | POA: Diagnosis not present

## 2016-02-04 DIAGNOSIS — I2510 Atherosclerotic heart disease of native coronary artery without angina pectoris: Secondary | ICD-10-CM | POA: Diagnosis not present

## 2016-02-04 DIAGNOSIS — N189 Chronic kidney disease, unspecified: Secondary | ICD-10-CM | POA: Diagnosis not present

## 2016-02-04 DIAGNOSIS — K210 Gastro-esophageal reflux disease with esophagitis: Secondary | ICD-10-CM | POA: Diagnosis not present

## 2016-02-04 DIAGNOSIS — J301 Allergic rhinitis due to pollen: Secondary | ICD-10-CM | POA: Diagnosis not present

## 2016-02-04 DIAGNOSIS — I4891 Unspecified atrial fibrillation: Secondary | ICD-10-CM | POA: Diagnosis not present

## 2016-02-04 DIAGNOSIS — G2581 Restless legs syndrome: Secondary | ICD-10-CM | POA: Diagnosis not present

## 2016-02-05 DIAGNOSIS — I5032 Chronic diastolic (congestive) heart failure: Secondary | ICD-10-CM | POA: Diagnosis not present

## 2016-02-05 DIAGNOSIS — I509 Heart failure, unspecified: Secondary | ICD-10-CM | POA: Diagnosis not present

## 2016-03-16 DIAGNOSIS — I214 Non-ST elevation (NSTEMI) myocardial infarction: Secondary | ICD-10-CM | POA: Diagnosis not present

## 2016-03-16 DIAGNOSIS — A419 Sepsis, unspecified organism: Secondary | ICD-10-CM | POA: Diagnosis not present

## 2016-03-16 DIAGNOSIS — N189 Chronic kidney disease, unspecified: Secondary | ICD-10-CM

## 2016-03-16 DIAGNOSIS — Z66 Do not resuscitate: Secondary | ICD-10-CM | POA: Diagnosis not present

## 2016-03-16 DIAGNOSIS — D631 Anemia in chronic kidney disease: Secondary | ICD-10-CM | POA: Diagnosis not present

## 2016-03-16 DIAGNOSIS — K219 Gastro-esophageal reflux disease without esophagitis: Secondary | ICD-10-CM

## 2016-03-16 DIAGNOSIS — J189 Pneumonia, unspecified organism: Secondary | ICD-10-CM | POA: Diagnosis not present

## 2016-03-16 DIAGNOSIS — I5033 Acute on chronic diastolic (congestive) heart failure: Secondary | ICD-10-CM | POA: Diagnosis not present

## 2016-03-16 DIAGNOSIS — M109 Gout, unspecified: Secondary | ICD-10-CM | POA: Diagnosis not present

## 2016-03-16 DIAGNOSIS — E119 Type 2 diabetes mellitus without complications: Secondary | ICD-10-CM

## 2016-03-16 DIAGNOSIS — R9431 Abnormal electrocardiogram [ECG] [EKG]: Secondary | ICD-10-CM | POA: Diagnosis not present

## 2016-03-16 DIAGNOSIS — N184 Chronic kidney disease, stage 4 (severe): Secondary | ICD-10-CM

## 2016-03-16 DIAGNOSIS — I480 Paroxysmal atrial fibrillation: Secondary | ICD-10-CM | POA: Diagnosis not present

## 2016-03-16 DIAGNOSIS — R0602 Shortness of breath: Secondary | ICD-10-CM | POA: Diagnosis not present

## 2016-03-16 DIAGNOSIS — Z79899 Other long term (current) drug therapy: Secondary | ICD-10-CM | POA: Diagnosis not present

## 2016-03-16 DIAGNOSIS — J9601 Acute respiratory failure with hypoxia: Secondary | ICD-10-CM | POA: Diagnosis not present

## 2016-03-16 DIAGNOSIS — Z951 Presence of aortocoronary bypass graft: Secondary | ICD-10-CM | POA: Diagnosis not present

## 2016-03-16 DIAGNOSIS — J159 Unspecified bacterial pneumonia: Secondary | ICD-10-CM | POA: Diagnosis not present

## 2016-03-16 DIAGNOSIS — E785 Hyperlipidemia, unspecified: Secondary | ICD-10-CM

## 2016-03-16 DIAGNOSIS — G2581 Restless legs syndrome: Secondary | ICD-10-CM

## 2016-03-16 DIAGNOSIS — J9602 Acute respiratory failure with hypercapnia: Secondary | ICD-10-CM | POA: Diagnosis not present

## 2016-03-16 DIAGNOSIS — I272 Other secondary pulmonary hypertension: Secondary | ICD-10-CM | POA: Diagnosis not present

## 2016-03-16 DIAGNOSIS — I509 Heart failure, unspecified: Secondary | ICD-10-CM | POA: Diagnosis not present

## 2016-03-16 DIAGNOSIS — N185 Chronic kidney disease, stage 5: Secondary | ICD-10-CM | POA: Diagnosis not present

## 2016-03-16 DIAGNOSIS — I130 Hypertensive heart and chronic kidney disease with heart failure and stage 1 through stage 4 chronic kidney disease, or unspecified chronic kidney disease: Secondary | ICD-10-CM | POA: Diagnosis not present

## 2016-03-16 DIAGNOSIS — M1990 Unspecified osteoarthritis, unspecified site: Secondary | ICD-10-CM | POA: Diagnosis not present

## 2016-03-16 DIAGNOSIS — N400 Enlarged prostate without lower urinary tract symptoms: Secondary | ICD-10-CM | POA: Diagnosis not present

## 2016-03-16 DIAGNOSIS — R748 Abnormal levels of other serum enzymes: Secondary | ICD-10-CM | POA: Diagnosis not present

## 2016-03-16 DIAGNOSIS — I2510 Atherosclerotic heart disease of native coronary artery without angina pectoris: Secondary | ICD-10-CM

## 2016-03-16 DIAGNOSIS — I10 Essential (primary) hypertension: Secondary | ICD-10-CM

## 2016-03-16 DIAGNOSIS — I255 Ischemic cardiomyopathy: Secondary | ICD-10-CM | POA: Diagnosis not present

## 2016-03-16 DIAGNOSIS — E8779 Other fluid overload: Secondary | ICD-10-CM | POA: Diagnosis not present

## 2016-03-16 DIAGNOSIS — J440 Chronic obstructive pulmonary disease with acute lower respiratory infection: Secondary | ICD-10-CM | POA: Diagnosis not present

## 2016-03-16 DIAGNOSIS — Z794 Long term (current) use of insulin: Secondary | ICD-10-CM | POA: Diagnosis not present

## 2016-03-16 DIAGNOSIS — E1122 Type 2 diabetes mellitus with diabetic chronic kidney disease: Secondary | ICD-10-CM | POA: Diagnosis not present

## 2016-03-17 DIAGNOSIS — I214 Non-ST elevation (NSTEMI) myocardial infarction: Secondary | ICD-10-CM | POA: Diagnosis not present

## 2016-03-17 DIAGNOSIS — N189 Chronic kidney disease, unspecified: Secondary | ICD-10-CM

## 2016-03-17 DIAGNOSIS — R9431 Abnormal electrocardiogram [ECG] [EKG]: Secondary | ICD-10-CM | POA: Diagnosis not present

## 2016-03-17 DIAGNOSIS — Z951 Presence of aortocoronary bypass graft: Secondary | ICD-10-CM | POA: Diagnosis not present

## 2016-03-17 DIAGNOSIS — G2581 Restless legs syndrome: Secondary | ICD-10-CM

## 2016-03-17 DIAGNOSIS — E785 Hyperlipidemia, unspecified: Secondary | ICD-10-CM

## 2016-03-17 DIAGNOSIS — J159 Unspecified bacterial pneumonia: Secondary | ICD-10-CM | POA: Diagnosis not present

## 2016-03-17 DIAGNOSIS — I10 Essential (primary) hypertension: Secondary | ICD-10-CM

## 2016-03-17 DIAGNOSIS — K219 Gastro-esophageal reflux disease without esophagitis: Secondary | ICD-10-CM

## 2016-03-17 DIAGNOSIS — J9601 Acute respiratory failure with hypoxia: Secondary | ICD-10-CM | POA: Diagnosis not present

## 2016-03-17 DIAGNOSIS — I2510 Atherosclerotic heart disease of native coronary artery without angina pectoris: Secondary | ICD-10-CM

## 2016-03-17 DIAGNOSIS — N184 Chronic kidney disease, stage 4 (severe): Secondary | ICD-10-CM

## 2016-03-17 DIAGNOSIS — I5043 Acute on chronic combined systolic (congestive) and diastolic (congestive) heart failure: Secondary | ICD-10-CM | POA: Diagnosis not present

## 2016-03-17 DIAGNOSIS — J189 Pneumonia, unspecified organism: Secondary | ICD-10-CM | POA: Diagnosis not present

## 2016-03-17 DIAGNOSIS — I272 Other secondary pulmonary hypertension: Secondary | ICD-10-CM | POA: Diagnosis not present

## 2016-03-17 DIAGNOSIS — E119 Type 2 diabetes mellitus without complications: Secondary | ICD-10-CM

## 2016-03-18 DIAGNOSIS — I214 Non-ST elevation (NSTEMI) myocardial infarction: Secondary | ICD-10-CM | POA: Diagnosis not present

## 2016-03-18 DIAGNOSIS — I272 Other secondary pulmonary hypertension: Secondary | ICD-10-CM | POA: Diagnosis not present

## 2016-03-18 DIAGNOSIS — I255 Ischemic cardiomyopathy: Secondary | ICD-10-CM | POA: Diagnosis not present

## 2016-03-18 DIAGNOSIS — J159 Unspecified bacterial pneumonia: Secondary | ICD-10-CM | POA: Diagnosis not present

## 2016-03-18 DIAGNOSIS — J9601 Acute respiratory failure with hypoxia: Secondary | ICD-10-CM | POA: Diagnosis not present

## 2016-03-18 DIAGNOSIS — N184 Chronic kidney disease, stage 4 (severe): Secondary | ICD-10-CM | POA: Diagnosis not present

## 2016-03-18 DIAGNOSIS — I491 Atrial premature depolarization: Secondary | ICD-10-CM | POA: Diagnosis not present

## 2016-03-18 DIAGNOSIS — J80 Acute respiratory distress syndrome: Secondary | ICD-10-CM | POA: Diagnosis not present

## 2016-03-18 DIAGNOSIS — R079 Chest pain, unspecified: Secondary | ICD-10-CM | POA: Diagnosis not present

## 2016-03-18 DIAGNOSIS — E8779 Other fluid overload: Secondary | ICD-10-CM | POA: Diagnosis not present

## 2016-03-19 ENCOUNTER — Inpatient Hospital Stay (HOSPITAL_COMMUNITY)
Admission: AD | Admit: 2016-03-19 | Discharge: 2016-03-26 | DRG: 280 | Disposition: A | Discharge status: Home / Self Care | Payer: PPO | Source: Other Acute Inpatient Hospital | Attending: Internal Medicine | Admitting: Internal Medicine

## 2016-03-19 ENCOUNTER — Encounter (HOSPITAL_COMMUNITY): Payer: Self-pay | Admitting: Internal Medicine

## 2016-03-19 ENCOUNTER — Telehealth: Payer: Self-pay | Admitting: Internal Medicine

## 2016-03-19 DIAGNOSIS — I255 Ischemic cardiomyopathy: Secondary | ICD-10-CM | POA: Diagnosis not present

## 2016-03-19 DIAGNOSIS — I25119 Atherosclerotic heart disease of native coronary artery with unspecified angina pectoris: Secondary | ICD-10-CM | POA: Insufficient documentation

## 2016-03-19 DIAGNOSIS — I130 Hypertensive heart and chronic kidney disease with heart failure and stage 1 through stage 4 chronic kidney disease, or unspecified chronic kidney disease: Secondary | ICD-10-CM | POA: Diagnosis not present

## 2016-03-19 DIAGNOSIS — N184 Chronic kidney disease, stage 4 (severe): Secondary | ICD-10-CM | POA: Diagnosis present

## 2016-03-19 DIAGNOSIS — M109 Gout, unspecified: Secondary | ICD-10-CM | POA: Diagnosis present

## 2016-03-19 DIAGNOSIS — D631 Anemia in chronic kidney disease: Secondary | ICD-10-CM | POA: Diagnosis present

## 2016-03-19 DIAGNOSIS — R0609 Other forms of dyspnea: Secondary | ICD-10-CM | POA: Diagnosis not present

## 2016-03-19 DIAGNOSIS — K219 Gastro-esophageal reflux disease without esophagitis: Secondary | ICD-10-CM | POA: Diagnosis present

## 2016-03-19 DIAGNOSIS — J159 Unspecified bacterial pneumonia: Secondary | ICD-10-CM | POA: Diagnosis not present

## 2016-03-19 DIAGNOSIS — I272 Other secondary pulmonary hypertension: Secondary | ICD-10-CM | POA: Diagnosis not present

## 2016-03-19 DIAGNOSIS — N179 Acute kidney failure, unspecified: Secondary | ICD-10-CM | POA: Diagnosis not present

## 2016-03-19 DIAGNOSIS — E8770 Fluid overload, unspecified: Secondary | ICD-10-CM | POA: Diagnosis present

## 2016-03-19 DIAGNOSIS — I2510 Atherosclerotic heart disease of native coronary artery without angina pectoris: Secondary | ICD-10-CM | POA: Diagnosis not present

## 2016-03-19 DIAGNOSIS — F1021 Alcohol dependence, in remission: Secondary | ICD-10-CM | POA: Diagnosis present

## 2016-03-19 DIAGNOSIS — I5023 Acute on chronic systolic (congestive) heart failure: Secondary | ICD-10-CM | POA: Diagnosis not present

## 2016-03-19 DIAGNOSIS — F418 Other specified anxiety disorders: Secondary | ICD-10-CM | POA: Diagnosis present

## 2016-03-19 DIAGNOSIS — Z7901 Long term (current) use of anticoagulants: Secondary | ICD-10-CM

## 2016-03-19 DIAGNOSIS — I482 Chronic atrial fibrillation, unspecified: Secondary | ICD-10-CM | POA: Diagnosis present

## 2016-03-19 DIAGNOSIS — I5022 Chronic systolic (congestive) heart failure: Secondary | ICD-10-CM | POA: Diagnosis present

## 2016-03-19 DIAGNOSIS — Z794 Long term (current) use of insulin: Secondary | ICD-10-CM | POA: Diagnosis not present

## 2016-03-19 DIAGNOSIS — J440 Chronic obstructive pulmonary disease with acute lower respiratory infection: Secondary | ICD-10-CM | POA: Diagnosis present

## 2016-03-19 DIAGNOSIS — N400 Enlarged prostate without lower urinary tract symptoms: Secondary | ICD-10-CM | POA: Diagnosis not present

## 2016-03-19 DIAGNOSIS — Z833 Family history of diabetes mellitus: Secondary | ICD-10-CM

## 2016-03-19 DIAGNOSIS — E871 Hypo-osmolality and hyponatremia: Secondary | ICD-10-CM | POA: Diagnosis present

## 2016-03-19 DIAGNOSIS — I10 Essential (primary) hypertension: Secondary | ICD-10-CM | POA: Diagnosis present

## 2016-03-19 DIAGNOSIS — J189 Pneumonia, unspecified organism: Secondary | ICD-10-CM | POA: Diagnosis not present

## 2016-03-19 DIAGNOSIS — I214 Non-ST elevation (NSTEMI) myocardial infarction: Secondary | ICD-10-CM | POA: Diagnosis not present

## 2016-03-19 DIAGNOSIS — Z951 Presence of aortocoronary bypass graft: Secondary | ICD-10-CM

## 2016-03-19 DIAGNOSIS — Z8249 Family history of ischemic heart disease and other diseases of the circulatory system: Secondary | ICD-10-CM | POA: Diagnosis not present

## 2016-03-19 DIAGNOSIS — N186 End stage renal disease: Secondary | ICD-10-CM | POA: Diagnosis not present

## 2016-03-19 DIAGNOSIS — I509 Heart failure, unspecified: Secondary | ICD-10-CM | POA: Diagnosis not present

## 2016-03-19 DIAGNOSIS — J9601 Acute respiratory failure with hypoxia: Secondary | ICD-10-CM | POA: Diagnosis present

## 2016-03-19 DIAGNOSIS — G2581 Restless legs syndrome: Secondary | ICD-10-CM | POA: Diagnosis not present

## 2016-03-19 DIAGNOSIS — E669 Obesity, unspecified: Secondary | ICD-10-CM | POA: Diagnosis not present

## 2016-03-19 DIAGNOSIS — Z87891 Personal history of nicotine dependence: Secondary | ICD-10-CM

## 2016-03-19 DIAGNOSIS — I491 Atrial premature depolarization: Secondary | ICD-10-CM | POA: Diagnosis not present

## 2016-03-19 DIAGNOSIS — I480 Paroxysmal atrial fibrillation: Secondary | ICD-10-CM | POA: Diagnosis present

## 2016-03-19 DIAGNOSIS — I25118 Atherosclerotic heart disease of native coronary artery with other forms of angina pectoris: Secondary | ICD-10-CM | POA: Diagnosis present

## 2016-03-19 DIAGNOSIS — E0822 Diabetes mellitus due to underlying condition with diabetic chronic kidney disease: Secondary | ICD-10-CM | POA: Diagnosis present

## 2016-03-19 DIAGNOSIS — I25718 Atherosclerosis of autologous vein coronary artery bypass graft(s) with other forms of angina pectoris: Secondary | ICD-10-CM | POA: Diagnosis present

## 2016-03-19 DIAGNOSIS — I252 Old myocardial infarction: Secondary | ICD-10-CM | POA: Diagnosis not present

## 2016-03-19 DIAGNOSIS — R9431 Abnormal electrocardiogram [ECG] [EKG]: Secondary | ICD-10-CM | POA: Diagnosis not present

## 2016-03-19 DIAGNOSIS — I481 Persistent atrial fibrillation: Secondary | ICD-10-CM | POA: Diagnosis not present

## 2016-03-19 DIAGNOSIS — I25700 Atherosclerosis of coronary artery bypass graft(s), unspecified, with unstable angina pectoris: Secondary | ICD-10-CM | POA: Diagnosis not present

## 2016-03-19 DIAGNOSIS — Z6829 Body mass index (BMI) 29.0-29.9, adult: Secondary | ICD-10-CM | POA: Diagnosis not present

## 2016-03-19 DIAGNOSIS — Z955 Presence of coronary angioplasty implant and graft: Secondary | ICD-10-CM | POA: Diagnosis not present

## 2016-03-19 DIAGNOSIS — E785 Hyperlipidemia, unspecified: Secondary | ICD-10-CM | POA: Diagnosis not present

## 2016-03-19 DIAGNOSIS — E1122 Type 2 diabetes mellitus with diabetic chronic kidney disease: Secondary | ICD-10-CM | POA: Diagnosis present

## 2016-03-19 HISTORY — DX: Benign prostatic hyperplasia without lower urinary tract symptoms: N40.0

## 2016-03-19 HISTORY — DX: Unspecified atrial fibrillation: I48.91

## 2016-03-19 HISTORY — DX: Acute myocardial infarction, unspecified: I21.9

## 2016-03-19 HISTORY — DX: Acute respiratory failure with hypoxia: J96.01

## 2016-03-19 HISTORY — DX: Essential (primary) hypertension: I10

## 2016-03-19 HISTORY — DX: Pneumonia, unspecified organism: J18.9

## 2016-03-19 HISTORY — DX: Other specified postprocedural states: Z98.890

## 2016-03-19 HISTORY — DX: Nausea with vomiting, unspecified: R11.2

## 2016-03-19 HISTORY — DX: Non-ST elevation (NSTEMI) myocardial infarction: I21.4

## 2016-03-19 HISTORY — DX: Gastro-esophageal reflux disease without esophagitis: K21.9

## 2016-03-19 HISTORY — DX: Fluid overload, unspecified: E87.70

## 2016-03-19 HISTORY — DX: Other specified anxiety disorders: F41.8

## 2016-03-19 HISTORY — DX: Diabetes mellitus due to underlying condition with diabetic chronic kidney disease: E08.22

## 2016-03-19 HISTORY — DX: Atherosclerotic heart disease of native coronary artery without angina pectoris: I25.10

## 2016-03-19 LAB — PROTIME-INR
INR: 1.3 (ref 0.00–1.49)
PROTHROMBIN TIME: 16.3 s — AB (ref 11.6–15.2)

## 2016-03-19 LAB — APTT: aPTT: 37 seconds (ref 24–37)

## 2016-03-19 LAB — GLUCOSE, CAPILLARY
GLUCOSE-CAPILLARY: 332 mg/dL — AB (ref 65–99)
GLUCOSE-CAPILLARY: 361 mg/dL — AB (ref 65–99)

## 2016-03-19 LAB — TROPONIN I: TROPONIN I: 0.94 ng/mL — AB (ref <0.03)

## 2016-03-19 MED ORDER — SODIUM CHLORIDE 0.9% FLUSH
3.0000 mL | Freq: Two times a day (BID) | INTRAVENOUS | Status: DC
Start: 2016-03-19 — End: 2016-03-26
  Administered 2016-03-19 – 2016-03-26 (×6): 3 mL via INTRAVENOUS

## 2016-03-19 MED ORDER — RISAQUAD PO CAPS
1.0000 | ORAL_CAPSULE | Freq: Every day | ORAL | Status: DC
  Administered 2016-03-20 – 2016-03-26 (×7): 1 via ORAL
  Filled 2016-03-19 (×7): qty 1

## 2016-03-19 MED ORDER — DEXTROSE 5 % IV SOLN
250.0000 mg | INTRAVENOUS | Status: DC
  Administered 2016-03-20 – 2016-03-22 (×3): 250 mg via INTRAVENOUS
  Filled 2016-03-19 (×6): qty 250

## 2016-03-19 MED ORDER — CLONIDINE HCL 0.2 MG/24HR TD PTWK
0.2000 mg | MEDICATED_PATCH | TRANSDERMAL | Status: DC
  Filled 2016-03-19: qty 1

## 2016-03-19 MED ORDER — PANTOPRAZOLE SODIUM 40 MG PO TBEC
40.0000 mg | DELAYED_RELEASE_TABLET | Freq: Every day | ORAL | Status: DC
  Administered 2016-03-20 – 2016-03-26 (×6): 40 mg via ORAL
  Filled 2016-03-19 (×7): qty 1

## 2016-03-19 MED ORDER — RANOLAZINE ER 500 MG PO TB12
500.0000 mg | ORAL_TABLET | Freq: Two times a day (BID) | ORAL | Status: DC
  Administered 2016-03-19 – 2016-03-26 (×14): 500 mg via ORAL
  Filled 2016-03-19 (×14): qty 1

## 2016-03-19 MED ORDER — HEPARIN (PORCINE) IN NACL 100-0.45 UNIT/ML-% IJ SOLN
1200.0000 [IU]/h | INTRAMUSCULAR | Status: DC
  Administered 2016-03-19: 1250 [IU]/h via INTRAVENOUS
  Administered 2016-03-20 – 2016-03-21 (×2): 1400 [IU]/h via INTRAVENOUS
  Administered 2016-03-22: 1350 [IU]/h via INTRAVENOUS
  Administered 2016-03-23: 1200 [IU]/h via INTRAVENOUS
  Administered 2016-03-23: 1350 [IU]/h via INTRAVENOUS
  Filled 2016-03-19 (×7): qty 250

## 2016-03-19 MED ORDER — ASPIRIN EC 81 MG PO TBEC
81.0000 mg | DELAYED_RELEASE_TABLET | Freq: Every day | ORAL | Status: DC
  Administered 2016-03-20 – 2016-03-26 (×8): 81 mg via ORAL
  Filled 2016-03-19 (×8): qty 1

## 2016-03-19 MED ORDER — ACETAMINOPHEN 650 MG RE SUPP: 650.0000 mg | Freq: Four times a day (QID) | RECTAL | Status: DC | PRN

## 2016-03-19 MED ORDER — ROSUVASTATIN CALCIUM 10 MG PO TABS
10.0000 mg | ORAL_TABLET | Freq: Every day | ORAL | Status: DC
  Administered 2016-03-20 – 2016-03-25 (×6): 10 mg via ORAL
  Filled 2016-03-19 (×6): qty 1

## 2016-03-19 MED ORDER — HYDRALAZINE HCL 50 MG PO TABS
50.0000 mg | ORAL_TABLET | Freq: Three times a day (TID) | ORAL | Status: DC
  Administered 2016-03-19 – 2016-03-26 (×19): 50 mg via ORAL
  Filled 2016-03-19 (×20): qty 1

## 2016-03-19 MED ORDER — INSULIN ASPART 100 UNIT/ML ~~LOC~~ SOLN
0.0000 [IU] | Freq: Three times a day (TID) | SUBCUTANEOUS | Status: DC
  Administered 2016-03-20 (×2): 9 [IU] via SUBCUTANEOUS
  Administered 2016-03-20: 5 [IU] via SUBCUTANEOUS
  Administered 2016-03-21: 3 [IU] via SUBCUTANEOUS
  Administered 2016-03-21 – 2016-03-22 (×3): 5 [IU] via SUBCUTANEOUS
  Administered 2016-03-22 (×2): 3 [IU] via SUBCUTANEOUS
  Administered 2016-03-23: 1 [IU] via SUBCUTANEOUS
  Administered 2016-03-23 – 2016-03-24 (×2): 2 [IU] via SUBCUTANEOUS
  Administered 2016-03-26: 5 [IU] via SUBCUTANEOUS

## 2016-03-19 MED ORDER — ROPINIROLE HCL 1 MG PO TABS
4.0000 mg | ORAL_TABLET | Freq: Every day | ORAL | Status: DC
  Administered 2016-03-19 – 2016-03-25 (×7): 4 mg via ORAL
  Filled 2016-03-19 (×7): qty 4

## 2016-03-19 MED ORDER — FERROUS SULFATE 325 (65 FE) MG PO TABS
325.0000 mg | ORAL_TABLET | Freq: Every day | ORAL | Status: DC
  Administered 2016-03-20 – 2016-03-26 (×7): 325 mg via ORAL
  Filled 2016-03-19 (×7): qty 1

## 2016-03-19 MED ORDER — CARVEDILOL 25 MG PO TABS
25.0000 mg | ORAL_TABLET | Freq: Two times a day (BID) | ORAL | Status: DC
  Administered 2016-03-20 – 2016-03-26 (×12): 25 mg via ORAL
  Filled 2016-03-19 (×13): qty 1

## 2016-03-19 MED ORDER — LORATADINE 10 MG PO TABS
10.0000 mg | ORAL_TABLET | Freq: Every day | ORAL | Status: DC
  Administered 2016-03-20 – 2016-03-26 (×7): 10 mg via ORAL
  Filled 2016-03-19 (×7): qty 1

## 2016-03-19 MED ORDER — NITROGLYCERIN 0.4 MG SL SUBL: 0.4000 mg | SUBLINGUAL_TABLET | SUBLINGUAL | Status: DC | PRN

## 2016-03-19 MED ORDER — ALBUTEROL SULFATE (2.5 MG/3ML) 0.083% IN NEBU
2.5000 mg | INHALATION_SOLUTION | Freq: Four times a day (QID) | RESPIRATORY_TRACT | Status: DC
  Administered 2016-03-19: 2.5 mg via RESPIRATORY_TRACT
  Filled 2016-03-19: qty 3

## 2016-03-19 MED ORDER — HYDRALAZINE HCL 20 MG/ML IJ SOLN: 5.0000 mg | INTRAMUSCULAR | Status: DC | PRN

## 2016-03-19 MED ORDER — ALBUTEROL SULFATE (2.5 MG/3ML) 0.083% IN NEBU
2.5000 mg | INHALATION_SOLUTION | Freq: Two times a day (BID) | RESPIRATORY_TRACT | Status: DC
  Administered 2016-03-20: 2.5 mg via RESPIRATORY_TRACT
  Filled 2016-03-19 (×2): qty 3

## 2016-03-19 MED ORDER — OXYCODONE HCL 5 MG PO TABS: 5.0000 mg | ORAL_TABLET | ORAL | Status: DC | PRN

## 2016-03-19 MED ORDER — ALLOPURINOL 100 MG PO TABS
100.0000 mg | ORAL_TABLET | Freq: Every day | ORAL | Status: DC
  Administered 2016-03-20 – 2016-03-26 (×7): 100 mg via ORAL
  Filled 2016-03-19 (×7): qty 1

## 2016-03-19 MED ORDER — ROPINIROLE HCL ER 8 MG PO TB24: 4.0000 mg | ORAL_TABLET | Freq: Every day | ORAL | Status: DC

## 2016-03-19 MED ORDER — ACETAMINOPHEN 325 MG PO TABS: 650.0000 mg | ORAL_TABLET | Freq: Four times a day (QID) | ORAL | Status: DC | PRN

## 2016-03-19 MED ORDER — ONDANSETRON HCL 4 MG PO TABS: 4.0000 mg | ORAL_TABLET | Freq: Four times a day (QID) | ORAL | Status: DC | PRN

## 2016-03-19 MED ORDER — ISOSORBIDE MONONITRATE ER 60 MG PO TB24
60.0000 mg | ORAL_TABLET | Freq: Every day | ORAL | Status: DC
  Administered 2016-03-20 – 2016-03-26 (×7): 60 mg via ORAL
  Filled 2016-03-19 (×7): qty 1

## 2016-03-19 MED ORDER — TAMSULOSIN HCL 0.4 MG PO CAPS
0.4000 mg | ORAL_CAPSULE | Freq: Every day | ORAL | Status: DC
  Administered 2016-03-20 – 2016-03-26 (×7): 0.4 mg via ORAL
  Filled 2016-03-19 (×7): qty 1

## 2016-03-19 MED ORDER — AMLODIPINE BESYLATE 5 MG PO TABS
5.0000 mg | ORAL_TABLET | Freq: Every day | ORAL | Status: DC
  Administered 2016-03-20 – 2016-03-26 (×7): 5 mg via ORAL
  Filled 2016-03-19 (×7): qty 1

## 2016-03-19 MED ORDER — ONDANSETRON HCL 4 MG/2ML IJ SOLN: 4.0000 mg | Freq: Four times a day (QID) | INTRAMUSCULAR | Status: DC | PRN

## 2016-03-19 MED ORDER — INSULIN NPH (HUMAN) (ISOPHANE) 100 UNIT/ML ~~LOC~~ SUSP
10.0000 [IU] | Freq: Two times a day (BID) | SUBCUTANEOUS | Status: DC
  Administered 2016-03-20 – 2016-03-21 (×2): 10 [IU] via SUBCUTANEOUS
  Filled 2016-03-19 (×2): qty 10

## 2016-03-19 NOTE — Telephone Encounter (Signed)
Called by Dr. Dwyane Dee from [facility] regarding transfer for Kyle [name], Date of birth 1940-12-14. This is a 75 year old gentleman with a history of coronary artery disease status post CABG, ischemic cardiomyopathy with an EF of 25% as of April 2017, diabetes, CAD by diabetic nephropathy with progressive renal failure. He was seen by nephrology as an outpatient and offered dialysis however patient declined. He was admitted several days ago to [place] with shortness of breath and initial chest x-ray showed an infiltrate as well as fluid overload. He was placed on IV antibiotics as well as diuresis. His initial troponin was negative, however it became elevated to 5.3>> 6.6>> 6.0, consistent with an NSTEMI. Cardiology was consulted, patient was treated on heparin drip and then per the recommendation currently is on dual antiplatelet therapy. He is not responding to diuresis, his urine output is poor and he is gaining weight. Cardiology discussed again with patient and now he is agreeable to dialysis. Dr. Dwyane Dee discussed with Dr. Lorrene Reid from nephrology who will see patient in consultation once he arrives to [facility]. Accepted to telemetry.  RN, on patient arrival, please call [phone] or [phone] and let patient placement RN know of the patient's arrival and a hospitalist will be assigned to admit the patient.   Kyle M. Cruzita Lederer, MD [facility] [phone]

## 2016-03-19 NOTE — H&P (Addendum)
History and Physical    Kyle [name] [id number] DOB: [birth year] DOA: 03/19/2016  Referring MD/NP/PA:   PCP: Kyle Neighbors, MD   Patient coming from:  The patient is coming from home.  At baseline, pt is dependent for most of ADL.     Chief Complaint: Shortness of breath  HPI: Kyle [name] is a 75 y.o. male with medical history significant of hypertension, hyperlipidemia, diabetes mellitus, COPD, GERD, gout, depression, anxiety, CAD, s/p of CABG, congestive heart failure, CKD-IV, BPH, anemia, remote history of alcoholism, atrial fibrillation on Eliquis, restless leg syndrome, who presents with a shortness of breath.  Patient is transfered here from [facility] pr Dr. Dwyane [name]  Pt was initially admitted to [facility] on 03/16/16 due to shortness of breath. Pt had fever of 101.5 and mild leukocytosis with WBC of 13.4. Oxygen desaturation to 70%. Chest x-ray showed possible right basilar aeration disease. Pt was treated with IV antibiotics (Rocephin, Zosyn and azithromycin) for CAP and IV Lasix for fluid overload. Patient states that his shortness of breath has improved.   Per report, his initial troponin was negative, however it became elevated to 2.8>>5.3>> 6.6>> 6.0, consistent with an NSTEMI. He never had chest pain.  Cardiology was consulted, patient was treated on heparin drip and then per the recommendation currently is on dual antiplatelet therapy (ASA and plavix). Of note, pt has CKD-IV, and was ween by renal in the past, and recommended dialysis 3 years ago, but patient refused dialysis in the past. EDP, Dr. Dwyane [name] discussed with Dr. Lorrene [name] from nephrology who will see patient in consultation in [place]. When I saw pt on the floor. He has mild SOB, denies chest pain, cough, fever, chills. No nausea, vomiting, abdominal pain, symptoms of UTI. No unilateral weakness. He has trace amount of leg edema.  ED Course on 03/16/16: pt was found to have WBC 13.4, creatinine 4.70, BUN 75.  ProBNP 11200.  Review of Systems:   General: no fevers, chills, no changes in body weight, has fatigue HEENT: no blurry vision, hearing changes or sore throat Pulm: has dyspnea, no coughing, wheezing CV: no chest pain, no palpitations Abd: no nausea, vomiting, abdominal pain, diarrhea, constipation GU: no dysuria, burning on urination, increased urinary frequency, hematuria  Ext: has trace leg edema Neuro: no unilateral weakness, numbness, or tingling, no vision change or hearing loss Skin: no rash MSK: No muscle spasm, no deformity, no limitation of range of movement in spin Heme: No easy bruising.  Travel history: No recent long distant travel.  Allergy: No Known Allergies  Past Medical History  Diagnosis Date  . CAD (coronary artery disease)   . Chronic systolic (congestive) heart failure ([place])   . CKD (chronic kidney disease), stage IV ([place])   . Atrial fibrillation ([place])   . GERD (gastroesophageal reflux disease)   . Essential hypertension   . HLD (hyperlipidemia)   . DM due to underlying condition with diabetic chronic kidney disease ([place])   . BPH (benign prostatic hyperplasia)   . Depression with anxiety   . GERD (gastroesophageal reflux disease)     Past Surgical History  Procedure Laterality Date  . Tonsillectomy    . Adenoidectomy    . Cardiac bypass      Social History:  reports that he has quit smoking. He does not have any smokeless tobacco history on file. He reports that he does not drink alcohol or use illicit drugs.  Family History:  Family History  Problem Relation Age  of Onset  . Heart disease Mother   . Heart disease Father   . Lung cancer Brother   . Diabetes Sister      Prior to Admission medications   Not on File    Physical Exam: Filed Vitals:   03/19/16 1841 03/19/16 1859 03/19/16 1938 03/19/16 2105  BP:  151/58 139/94   Pulse:  76 80   Temp:  97.5 F (36.4 C) 97.5 F (36.4 C)   TempSrc:  Oral Oral   Resp:  20 18   Height: 5'  8" (1.727 m)     Weight: 89 kg (196 lb 3.4 oz)     SpO2:  94% 92% 90%   General: Not in acute distress HEENT:       Eyes: PERRL, EOMI, no scleral icterus.       ENT: No discharge from the ears and nose, no pharynx injection, no tonsillar enlargement.        Neck: No JVD, no bruit, no mass felt. Heme: No neck lymph node enlargement. Cardiac: S1/S2, RRR, No murmurs, No gallops or rubs. Pulm:  No rales, wheezing, rhonchi or rubs. Abd: Soft, nondistended, nontender, no rebound pain, no organomegaly, BS present. GU: No hematuria Ext: has trace leg edema bilaterally. 2+DP/PT pulse bilaterally. Musculoskeletal: No joint deformities, No joint redness or warmth, no limitation of ROM in spin. Skin: No rashes.  Neuro: Alert, oriented X3, cranial nerves II-XII grossly intact, moves all extremities normally. Psych: Patient is not psychotic, no suicidal or hemocidal ideation.  Labs on Admission: I have personally reviewed following labs and imaging studies  CBC: No results for input(s): WBC, NEUTROABS, HGB, HCT, MCV, PLT in the last 168 hours. Basic Metabolic Panel: No results for input(s): NA, K, CL, CO2, GLUCOSE, BUN, CREATININE, CALCIUM, MG, PHOS in the last 168 hours. GFR: CrCl cannot be calculated (Patient has no serum creatinine result on file.). Liver Function Tests: No results for input(s): AST, ALT, ALKPHOS, BILITOT, PROT, ALBUMIN in the last 168 hours. No results for input(s): LIPASE, AMYLASE in the last 168 hours. No results for input(s): AMMONIA in the last 168 hours. Coagulation Profile:  Recent Labs Lab 03/19/16 2114  INR 1.30   Cardiac Enzymes:  Recent Labs Lab 03/19/16 2114  TROPONINI 0.94*   BNP (last 3 results) No results for input(s): PROBNP in the last 8760 hours. HbA1C: No results for input(s): HGBA1C in the last 72 hours. CBG:  Recent Labs Lab 03/19/16 1843 03/19/16 2250  GLUCAP 332* 361*   Lipid Profile: No results for input(s): CHOL, HDL, LDLCALC,  TRIG, CHOLHDL, LDLDIRECT in the last 72 hours. Thyroid Function Tests: No results for input(s): TSH, T4TOTAL, FREET4, T3FREE, THYROIDAB in the last 72 hours. Anemia Panel: No results for input(s): VITAMINB12, FOLATE, FERRITIN, TIBC, IRON, RETICCTPCT in the last 72 hours. Urine analysis: No results found for: COLORURINE, APPEARANCEUR, LABSPEC, PHURINE, GLUCOSEU, HGBUR, BILIRUBINUR, KETONESUR, PROTEINUR, UROBILINOGEN, NITRITE, LEUKOCYTESUR Sepsis Labs: @LABRCNTIP (procalcitonin:4,lacticidven:4) )No results found for this or any previous visit (from the past 240 hour(s)).   Radiological Exams on Admission: No results found.   EKG 03/19/16: Independently reviewed. Sinus rhythm, QTC 461, Q-wave in inferior II/aVF, T-wave flattening in lateral leads.   Assessment/Plan Principal Problem:   Acute respiratory failure with hypoxia (HCC) Active Problems:   CAD (coronary artery disease)   Chronic systolic (congestive) heart failure (HCC)   CKD (chronic kidney disease), stage IV (HCC)   Atrial fibrillation (HCC)   GERD (gastroesophageal reflux disease)   Essential hypertension  HLD (hyperlipidemia)   DM due to underlying condition with diabetic chronic kidney disease (HCC)   BPH (benign prostatic hyperplasia)   Depression with anxiety   CAP (community acquired pneumonia)   Fluid overload   NSTEMI (non-ST elevated myocardial infarction) ([place])   Acute respiratory failure with hypoxia ([place]): likely due to combination of fluid overload and possible CAP. His SOB has improved. Currently, has no fever, cough, chest pain. Will narrow down antibiotics. Cardiology, Dr. Raiford Simmonds recommended IV lasix gtt.  -will admit to tele bed as inpt -narrow down Abx from Rocephin, Zosyn and azithromycin--> azithromycin. -Lasix drip 10 mg per hour. -repeat CXR -prn Albuterol nebs  Possible CAP: -see above  NSTEMI and CAD: s/p of CABG. trop 2.8>>5.3>> 6.6>> 6.0. Pt has no CP. Card, Dr. Raiford Simmonds was consulted. Per  Dr. Raiford Simmonds, pt has recurrent ADHF episodes and one of the culprits is his NSTEMI and the orther is his CKD. He recommended aggressive volume removal and a possible cardiac cath.   - appreciate Dr. Teofilo Pod consultation, with follow-up recommendations as follows;   - Cont on Heparin, ASA and clopidogrel.  - NPO for possible LHC. However he requires first a discussion with the nephrologist. It will require a discussion between cardiology and neprholgy t decide what to do firsts cath or HD.   - In the mean time start on iv lasix drip with strict I/O  - Cont other meds, besides his apixaban   - Rule out ongoing infection with CXR and follow up call on OSH cultures.  -prn Nitroglycerin -Continue coreg, Imdur, Ranolazine, crestor -trop x 3  Chronic systolic (congestive) heart failure ([place]): Patient had 2-D echo on 01/06/16, which showed EF 55-60%, but myocardial imaging with SPECT showed EF 24%. Pt now has fluid overload. His CKD-IV has also contributed. -continue ASA, coreg -On IV lasix gtt  CKD (chronic kidney disease), stage IV ([place]): Creatinine 4.7, BUN 75, no previous creatinine data available in Epic. EDP, Dr. Dwyane [name] discussed with Dr. Lorrene [name] from nephrology, who will see patient in consultation. -f/u renal recommendations  GERD: -Protonix  Gout: stable -continue home allopurinol   HTN: -Continue amlodipine, Coreg, clonidine, oral hydralazine -Also on IV Lasix gtt -Hydralazine IV when necessary  Atrial Fibrillation: CHA2DS2-VASc Score is 5, needs oral anticoagulation. Patient is on Eliquis at home. Heart rate is well controlled. -switched Eliqus to IV heparin due to NSTEMI -continue Coreg  HLD: Last LDL was 57.8 on 03/16/16 -Continue home medications: Crestor  DM-II: Last A1c not on record. Patient is taking NPH insulin and NovoLog at home -will decrease NPH insulin dose from 15-10 units twice a day  -SSI -Check A1c  BPH: stable - Continue Flomax  Depression and  anxiety: Stable, no suicidal or homicidal ideations. -Continue home medications: Requip   DVT ppx: On IV Heparin  Code Status: Full code Family Communication: Yes, patient's son at bed side Disposition Plan:  Anticipate discharge back to previous home environment Consults called:  Card,dr. Fudim Admission status: Inpatient/tele  Date of Service 03/20/2016    Ivor Costa [facility] Pager [phone]  If 7PM-7AM, please contact night-coverage [email or web address] Password [email or web address] 03/20/2016, 1:20 AM

## 2016-03-19 NOTE — Consult Note (Signed)
Cardiology Consult    Patient ID: Kyle [name] MRN: [id number], DOB/AGE: [birth year]   Admit date: 03/19/2016 Date of Consult: 03/19/2016  Primary Physician: Wende Neighbors, MD Primary Cardiologist: [facility] Requesting Provider: ---  Patient Profile   75 y o man transferred to [facility] for NSTEMI in setting of SOB and renal failure  Past Medical History   Past Medical History  Diagnosis Date  . CAD (coronary artery disease)   . Chronic systolic (congestive) heart failure ([place])   . CKD (chronic kidney disease), stage IV ([place])   . Atrial fibrillation ([place])   . GERD (gastroesophageal reflux disease)   . Essential hypertension   . HLD (hyperlipidemia)   . DM due to underlying condition with diabetic chronic kidney disease ([place])   . BPH (benign prostatic hyperplasia)   . Depression with anxiety   . GERD (gastroesophageal reflux disease)     Surgical history: CABG x4 20 y ago  Allergies  No Known Allergies  History of Present Illness    Kyle [name] presents from an OSH for evaluation of HD in the setting of NSTEMI and CKD. He presented to the OSH on 7/3 with several day history of worsening SOB. He has been previously admitted to local hospitals 4 times with the last 6 months for SOB each time. He reports 5lb weight gain. No cough. On presentation to the OSH he was found to have a WBC of 13 with bilateral pulm infiltrates and a fever to 101.3. He was also found to be hypoxic to the 80's. He uses home O2 PRN since 1 month ago. HE was started on BSA for presumed PNA. He was also treated with iv diuretics for presumed ADHF. He had reportedly only a poor response to his diuretics and his creatinine was found to be 3-3.9 during the hospital stay. Further his initial trop was 0.5 and rose to 6. He was started on DAPT and Hepari (home apixaban was held)n. Patient innitialy refused LHC given risk for HD which he did not want. Upon further discussion and evaluation by the local cardiologist the  patient appears now to be amendable to cardiac eval and HD. He was sent to [facility] for HD initiation.   The patient has a history of CAD with CABG 6 y ago, reportedly 4 vessels,  with several PCI (inknown vessels). He had repeat LHC in 2013 with occluded native vessels and patent LIMA to LAD/D2 (jump-graft or separate vessel-possibly radial vessel, unclear from OSH notes), SVG to LCX was patent as well. SVG to RCA was occluded.  He ahd a TTE in April with preserved LVEF and moderate Kyle and TR. A niclear stress in 01/2016 then showed an LVEF of 24% with inferior scar and only small reversible defect. But notably with transient ischemic dilation.    Inpatient Medications    . acidophilus  1 capsule Oral Daily  . [START ON 03/20/2016] albuterol  2.5 mg Nebulization BID  . allopurinol  100 mg Oral Daily  . amLODipine  5 mg Oral Daily  . aspirin EC  81 mg Oral Daily  . azithromycin  250 mg Intravenous Q24H  . [START ON 03/20/2016] carvedilol  25 mg Oral BID WC  . cloNIDine  0.2 mg Transdermal Weekly  . [START ON 03/20/2016] ferrous sulfate  325 mg Oral Q breakfast  . hydrALAZINE  50 mg Oral Q8H  . [START ON 03/20/2016] insulin aspart  0-9 Units Subcutaneous TID WC  . insulin NPH Human  10 Units Subcutaneous BID AC & HS  . isosorbide mononitrate  60 mg Oral Daily  . loratadine  10 mg Oral Daily  . [START ON 03/20/2016] pantoprazole  40 mg Oral Q1200  . ranolazine  500 mg Oral BID  . rOPINIRole  8 mg Oral QHS  . [START ON 03/20/2016] rosuvastatin  10 mg Oral q1800  . sodium chloride flush  3 mL Intravenous Q12H  . tamsulosin  0.4 mg Oral Daily    Family History    Parents both with CAD  Social History    Social History   Social History  . Marital Status: Married    Spouse Name: N/A  . Number of Children: N/A  . Years of Education: N/A   Occupational History  . Not on file.   Social History Main Topics  . Smoking status: Not on file  . Smokeless tobacco: Not on file  . Alcohol Use: Not  on file  . Drug Use: Not on file  . Sexual Activity: Not on file   Other Topics Concern  . Not on file   Social History Narrative  . No narrative on file    Remote history of smoking with 20 PY  Review of Systems    General:  No chills, fever, night sweats or weight changes.  Cardiovascular:  No chest pain, dyspnea on exertion, edema, orthopnea, palpitations, paroxysmal nocturnal dyspnea. Dermatological: No rash, lesions/masses Respiratory: No cough, dyspnea Urologic: No hematuria, dysuria Abdominal:   No nausea, vomiting, diarrhea, bright red blood per rectum, melena, or hematemesis Neurologic:  No visual changes, wkns, changes in mental status. All other systems reviewed and are otherwise negative except as noted above.  Physical Exam    Blood pressure 139/94, pulse 80, temperature 97.5 F (36.4 C), temperature source Oral, resp. rate 18, height 5\' 8"  (1.727 m), weight 89 kg (196 lb 3.4 oz), SpO2 90 %.  General: Pleasant, NAD Psych: Normal affect. Neuro: Alert and oriented X 3. Moves all extremities spontaneously. HEENT: Normal  Neck: JVD elevated to ear at 45 degrees Lungs:  Resp regular and unlabored, CTA. Heart: 2/6 syst m over apex Abdomen: Soft, non-tender, non-distended, BS + x 4.  Extremities: No clubbing, cyanosis or edema. DP/PT/2+. He has a scar on his left radial, liely from a prior arterial harvest. No pulse there.   Labs    Troponin (Point of Care Test) No results for input(s): TROPIPOC in the last 72 hours. No results for input(s): CKTOTAL, CKMB, TROPONINI in the last 72 hours. No results found for: WBC, HGB, HCT, MCV, PLT No results for input(s): NA, K, CL, CO2, BUN, CREATININE, CALCIUM, PROT, BILITOT, ALKPHOS, ALT, AST, GLUCOSE in the last 168 hours.  Invalid input(s): LABALBU No results found for: CHOL, HDL, LDLCALC, TRIG No results found for: [facility]   Radiology Studies    No results found.  ECG & Cardiac Imaging    OSH ECG reviewed without  evidence of ischemia   Assessment & Plan    Kyle [name] is a 75 y o man with CAD s.p CABG and PCI, CKD, Afib (CHadsvasc 6), HTn, DM, who presents to OSH with recurrent episode of SOB. He had several admission for SOB now, most of which sound to be ADHF. The episode now seems to be complicated by an NSTEMI. His last nuclear stress in May indicated newly reduced LVEF with signs of TID. Although he had a mild white count and fever on presentation those issues have resolved and I  have little concern for PNA. I believe Kyle [name] has recurrent ADHF episodes and one of the culprits is his NSTEMI and the orther is his CKD. I discussed the need for aggressive volume removal and possible need for LHC. He understands the risk associated with a  LHC and is now willing to undergo  HD if needed. He is DNR and is willing to reverse it for the cath.   Recommendations:  - Cont on Heparin, ASA and clopidogrel. - NPO for possible LHC. However he requires first a discussion with the nephrologist. It will require a discussion between cardiology and neprholgy t decide what to do firsts cath or HD.  - In the mean time start on iv lasix drip with strict I/O - Cont other meds, besides his apixaban  - Rule out ongoing infection with CXR and follow up call on OSH cultures.   Signed, Cristina Gong, MD  03/19/2016, 10:00 PM

## 2016-03-19 NOTE — Progress Notes (Signed)
ANTICOAGULATION CONSULT NOTE - Initial Consult  Pharmacy Consult for heparin Indication: atrial fibrillation  No Known Allergies  Patient Measurements: Height: 5\' 8"  (172.7 cm) Weight: 196 lb 3.4 oz (89 kg) IBW/kg (Calculated) : 68.4 Heparin Dosing Weight: 86.5kg  Vital Signs: Temp: 97.5 F (36.4 C) (07/06 1938) Temp Source: Oral (07/06 1938) BP: 139/94 mmHg (07/06 1938) Pulse Rate: 80 (07/06 1938)  Labs: No results for input(s): HGB, HCT, PLT, APTT, LABPROT, INR, HEPARINUNFRC, HEPRLOWMOCWT, CREATININE, CKTOTAL, CKMB, TROPONINI in the last 72 hours.  CrCl cannot be calculated (Patient has no serum creatinine result on file.).   Medical History: Past Medical History  Diagnosis Date  . CAD (coronary artery disease)   . Chronic systolic (congestive) heart failure ([place])   . CKD (chronic kidney disease), stage IV ([place])   . Atrial fibrillation ([place])   . GERD (gastroesophageal reflux disease)   . Essential hypertension   . HLD (hyperlipidemia)   . DM due to underlying condition with diabetic chronic kidney disease ([place])   . BPH (benign prostatic hyperplasia)   . Depression with anxiety   . GERD (gastroesophageal reflux disease)     Assessment: 47 YOM transferred from [facility] for new HD. NSTEMI at [facility] and was on IV heparin, however from paper chart sent with patient, seems as though this was stopped sometime on 7/4, though no clear reason charted and cannot find times in [facility]. Patient not on heparin when I went into the room 7/6. Patient was on Eliquis PTA- confirmed with him that his last dose PTA was Sunday morning 7/2.  Labs from 7/6 AM at [facility]- Hgb 9.2, plts 248  Last known rate of heparin was 1250 units/hr with a resulting aPTT of 51.1 seconds. Per notes from [facility], goal aPTT per protocol at their institution is 45-65 seconds.  Goal of Therapy:  Heparin level 0.3-0.7 units/ml aPTT 66-102 seconds Monitor platelets by anticoagulation protocol:  Yes   Plan:  -restart heparin at 1250 units/hr -heparin level with AM labs- will not get aPTT at this time as last dose of Eliquis should be cleared. However, if HL is elevated, may need aPTT to confirm -daily HL and CBC -follow for cardiology and nephrology plans  Jazira Maloney D. Meosha Castanon, PharmD, BCPS Clinical Pharmacist Pager: [phone] 03/19/2016 9:27 PM

## 2016-03-20 ENCOUNTER — Inpatient Hospital Stay (HOSPITAL_COMMUNITY): Payer: PPO

## 2016-03-20 ENCOUNTER — Encounter (HOSPITAL_COMMUNITY): Payer: Self-pay | Admitting: Internal Medicine

## 2016-03-20 DIAGNOSIS — I130 Hypertensive heart and chronic kidney disease with heart failure and stage 1 through stage 4 chronic kidney disease, or unspecified chronic kidney disease: Secondary | ICD-10-CM | POA: Diagnosis not present

## 2016-03-20 DIAGNOSIS — N179 Acute kidney failure, unspecified: Secondary | ICD-10-CM | POA: Diagnosis not present

## 2016-03-20 DIAGNOSIS — N184 Chronic kidney disease, stage 4 (severe): Secondary | ICD-10-CM

## 2016-03-20 DIAGNOSIS — I214 Non-ST elevation (NSTEMI) myocardial infarction: Secondary | ICD-10-CM | POA: Diagnosis not present

## 2016-03-20 DIAGNOSIS — J9601 Acute respiratory failure with hypoxia: Secondary | ICD-10-CM | POA: Diagnosis not present

## 2016-03-20 DIAGNOSIS — I2510 Atherosclerotic heart disease of native coronary artery without angina pectoris: Secondary | ICD-10-CM

## 2016-03-20 DIAGNOSIS — J440 Chronic obstructive pulmonary disease with acute lower respiratory infection: Secondary | ICD-10-CM | POA: Diagnosis not present

## 2016-03-20 DIAGNOSIS — I5022 Chronic systolic (congestive) heart failure: Secondary | ICD-10-CM | POA: Diagnosis not present

## 2016-03-20 DIAGNOSIS — I25718 Atherosclerosis of autologous vein coronary artery bypass graft(s) with other forms of angina pectoris: Secondary | ICD-10-CM | POA: Diagnosis not present

## 2016-03-20 DIAGNOSIS — I482 Chronic atrial fibrillation: Secondary | ICD-10-CM | POA: Diagnosis not present

## 2016-03-20 DIAGNOSIS — E871 Hypo-osmolality and hyponatremia: Secondary | ICD-10-CM | POA: Diagnosis not present

## 2016-03-20 DIAGNOSIS — I480 Paroxysmal atrial fibrillation: Secondary | ICD-10-CM

## 2016-03-20 DIAGNOSIS — J449 Chronic obstructive pulmonary disease, unspecified: Secondary | ICD-10-CM | POA: Diagnosis not present

## 2016-03-20 DIAGNOSIS — I10 Essential (primary) hypertension: Secondary | ICD-10-CM

## 2016-03-20 DIAGNOSIS — J189 Pneumonia, unspecified organism: Secondary | ICD-10-CM | POA: Diagnosis not present

## 2016-03-20 DIAGNOSIS — I5023 Acute on chronic systolic (congestive) heart failure: Secondary | ICD-10-CM | POA: Diagnosis not present

## 2016-03-20 DIAGNOSIS — R0602 Shortness of breath: Secondary | ICD-10-CM | POA: Diagnosis not present

## 2016-03-20 DIAGNOSIS — I129 Hypertensive chronic kidney disease with stage 1 through stage 4 chronic kidney disease, or unspecified chronic kidney disease: Secondary | ICD-10-CM | POA: Diagnosis not present

## 2016-03-20 DIAGNOSIS — E8770 Fluid overload, unspecified: Secondary | ICD-10-CM

## 2016-03-20 DIAGNOSIS — E1122 Type 2 diabetes mellitus with diabetic chronic kidney disease: Secondary | ICD-10-CM | POA: Diagnosis not present

## 2016-03-20 LAB — BASIC METABOLIC PANEL
ANION GAP: 9 (ref 5–15)
Anion gap: 13 (ref 5–15)
BUN: 83 mg/dL — ABNORMAL HIGH (ref 6–20)
BUN: 90 mg/dL — ABNORMAL HIGH (ref 6–20)
CALCIUM: 8.7 mg/dL — AB (ref 8.9–10.3)
CHLORIDE: 100 mmol/L — AB (ref 101–111)
CHLORIDE: 101 mmol/L (ref 101–111)
CO2: 19 mmol/L — AB (ref 22–32)
CO2: 20 mmol/L — AB (ref 22–32)
CREATININE: 5.3 mg/dL — AB (ref 0.61–1.24)
Calcium: 9 mg/dL (ref 8.9–10.3)
Creatinine, Ser: 5.24 mg/dL — ABNORMAL HIGH (ref 0.61–1.24)
GFR calc Af Amer: 11 mL/min — ABNORMAL LOW (ref >60)
GFR calc non Af Amer: 9 mL/min — ABNORMAL LOW (ref >60)
GFR calc non Af Amer: 10 mL/min — ABNORMAL LOW (ref >60)
GFR, EST AFRICAN AMERICAN: 11 mL/min — AB (ref >60)
GLUCOSE: 279 mg/dL — AB (ref 65–99)
Glucose, Bld: 358 mg/dL — ABNORMAL HIGH (ref 65–99)
POTASSIUM: 4.6 mmol/L (ref 3.5–5.1)
Potassium: 4 mmol/L (ref 3.5–5.1)
SODIUM: 132 mmol/L — AB (ref 135–145)
Sodium: 130 mmol/L — ABNORMAL LOW (ref 135–145)

## 2016-03-20 LAB — URINE MICROSCOPIC-ADD ON

## 2016-03-20 LAB — CBC
HEMATOCRIT: 27.6 % — AB (ref 39.0–52.0)
HEMOGLOBIN: 9.2 g/dL — AB (ref 13.0–17.0)
MCH: 29.8 pg (ref 26.0–34.0)
MCHC: 33.3 g/dL (ref 30.0–36.0)
MCV: 89.3 fL (ref 78.0–100.0)
Platelets: 313 10*3/uL (ref 150–400)
RBC: 3.09 MIL/uL — AB (ref 4.22–5.81)
RDW: 16.7 % — AB (ref 11.5–15.5)
WBC: 6.6 10*3/uL (ref 4.0–10.5)

## 2016-03-20 LAB — TROPONIN I
TROPONIN I: 0.66 ng/mL — AB (ref <0.03)
TROPONIN I: 0.6 ng/mL — AB (ref <0.03)

## 2016-03-20 LAB — GLUCOSE, CAPILLARY
GLUCOSE-CAPILLARY: 396 mg/dL — AB (ref 65–99)
GLUCOSE-CAPILLARY: 299 mg/dL — AB (ref 65–99)
Glucose-Capillary: 389 mg/dL — ABNORMAL HIGH (ref 65–99)
Glucose-Capillary: 273 mg/dL — ABNORMAL HIGH (ref 65–99)

## 2016-03-20 LAB — URINALYSIS, ROUTINE W REFLEX MICROSCOPIC
Bilirubin Urine: NEGATIVE
GLUCOSE, UA: 250 mg/dL — AB
Hgb urine dipstick: NEGATIVE
Ketones, ur: NEGATIVE mg/dL
NITRITE: NEGATIVE
PH: 5 (ref 5.0–8.0)
PROTEIN: 100 mg/dL — AB
Specific Gravity, Urine: 1.02 (ref 1.005–1.030)

## 2016-03-20 LAB — MRSA PCR SCREENING: MRSA by PCR: NEGATIVE

## 2016-03-20 LAB — APTT
APTT: 66 s — AB (ref 24–37)
aPTT: 56 seconds — ABNORMAL HIGH (ref 24–37)

## 2016-03-20 LAB — HEPARIN LEVEL (UNFRACTIONATED): HEPARIN UNFRACTIONATED: 0.84 [IU]/mL — AB (ref 0.30–0.70)

## 2016-03-20 LAB — ECHOCARDIOGRAM COMPLETE
Height: 68 in
WEIGHTICAEL: 3139.35 [oz_av]

## 2016-03-20 MED ORDER — SODIUM CHLORIDE 0.9% FLUSH
3.0000 mL | Freq: Two times a day (BID) | INTRAVENOUS | Status: DC
  Administered 2016-03-21 – 2016-03-23 (×3): 3 mL via INTRAVENOUS

## 2016-03-20 MED ORDER — SODIUM CHLORIDE 0.9 % IV SOLN: 250.0000 mL | INTRAVENOUS | Status: DC | PRN

## 2016-03-20 MED ORDER — CLOPIDOGREL BISULFATE 75 MG PO TABS
75.0000 mg | ORAL_TABLET | Freq: Every day | ORAL | Status: DC
  Administered 2016-03-20 – 2016-03-26 (×7): 75 mg via ORAL
  Filled 2016-03-20 (×7): qty 1

## 2016-03-20 MED ORDER — SODIUM CHLORIDE 0.9 % IV SOLN
INTRAVENOUS | Status: DC
  Administered 2016-03-23: 17:00:00 via INTRAVENOUS

## 2016-03-20 MED ORDER — SODIUM CHLORIDE 0.9% FLUSH: 3.0000 mL | INTRAVENOUS | Status: DC | PRN

## 2016-03-20 MED ORDER — FUROSEMIDE 10 MG/ML IJ SOLN
15.0000 mg/h | INTRAVENOUS | Status: AC
  Administered 2016-03-20 – 2016-03-21 (×2): 10 mg/h via INTRAVENOUS
  Administered 2016-03-21: 15 mg/h via INTRAVENOUS
  Filled 2016-03-20 (×5): qty 25

## 2016-03-20 NOTE — Progress Notes (Signed)
OT Cancellation Note  Patient Details Name: Kyle [name] MRN: [id number] DOB: [birth year]   Cancelled Treatment:    Reason Eval/Treat Not Completed: Patient at procedure or test/ unavailable (ECHO lab). Will follow.  [name] 03/20/2016, 1:06 PM  [phone]

## 2016-03-20 NOTE — Progress Notes (Signed)
  Echocardiogram 2D Echocardiogram has been performed.  Kyle [name] 03/20/2016, 1:12 PM

## 2016-03-20 NOTE — Evaluation (Signed)
Occupational Therapy Evaluation Patient Details Name: Kyle [name] MRN: [id number] DOB: [birth year] Today's Date: 03/20/2016    History of Present Illness 75 y.o. male admitted with acute respiratory failure, CAP, NSTEMI. PMH of DM, BPH, MI, gout, COPD, CKD, CAD.    Clinical Impression   Pt was independent to modified independent in ADL, IADL and mobility prior to admission. Requiring minimal assist and use of RW for OOB mobility and min to min guard assist for ADL. Began education in energy conservation strategies and recommended seated showering. Anticipate pt to progress well. Will follow.    Follow Up Recommendations  No OT follow up    Equipment Recommendations  3 in 1 bedside comode (rollator)    Recommendations for Other Services       Precautions / Restrictions Precautions Precautions: Fall Restrictions Weight Bearing Restrictions: No      Mobility Bed Mobility Overal bed mobility: Modified Independent             General bed mobility comments: with bedrail, HOB flat  Transfers Overall transfer level: Needs assistance Equipment used: Rolling walker (2 wheeled) Transfers: Sit to/from Stand Sit to Stand: Min assist         General transfer comment: VCs hand placement, min A to rise    Balance                                           ADL Overall ADL's : Needs assistance/impaired Eating/Feeding: Independent;Sitting   Grooming: Wash/dry hands;Standing;Supervision/safety   Upper Body Bathing: Set up;Sitting   Lower Body Bathing: Minimal assistance;Sit to/from stand Lower Body Bathing Details (indicate cue type and reason): for sit to stand Upper Body Dressing : Set up;Sitting   Lower Body Dressing: Sit to/from stand;Minimal assistance Lower Body Dressing Details (indicate cue type and reason): for sit to stand, pt able to don socks crossing foot over opposite knee Toilet Transfer: Min guard;Ambulation;BSC;RW   Toileting-  Clothing Manipulation and Hygiene: Minimal assistance;Sit to/from stand       Functional mobility during ADLs: Min guard;Rolling walker General ADL Comments: Recommended sitting to shower and hand held shower head. Agreeable to OT ordering 3 in 1     Vision     Perception     Praxis      Pertinent Vitals/Pain Pain Assessment: No/denies pain     Hand Dominance Right   Extremity/Trunk Assessment Upper Extremity Assessment Upper Extremity Assessment: Overall WFL for tasks assessed   Lower Extremity Assessment Lower Extremity Assessment: Defer to PT evaluation   Cervical / Trunk Assessment Cervical / Trunk Assessment: Normal   Communication Communication Communication: No difficulties   Cognition Arousal/Alertness: Awake/alert Behavior During Therapy: WFL for tasks assessed/performed Overall Cognitive Status: Within Functional Limits for tasks assessed                     General Comments       Exercises       Shoulder Instructions      Home Living Family/patient expects to be discharged to:: Private residence Living Arrangements: Spouse/significant other Available Help at Discharge: Family;Available 24 hours/day Type of Home: House Home Access: Ramped entrance     Home Layout: One level     Bathroom Shower/Tub: Occupational psychologist: Standard     Home Equipment: Cane - single point;Bedside commode;Grab bars - tub/shower   Additional  Comments: keeps 3 in 1 over toilet, has a seat he will sit on to perform grooming at sink      Prior Functioning/Environment Level of Independence: Independent        Comments: independent with ADLs, worked in garden, drives, used cane rarely    OT Diagnosis: Generalized weakness   OT Problem List: Decreased strength;Decreased activity tolerance;Impaired balance (sitting and/or standing);Decreased knowledge of use of DME or AE;Cardiopulmonary status limiting activity   OT  Treatment/Interventions: Self-care/ADL training;DME and/or AE instruction;Patient/family education;Energy conservation    OT Goals(Current goals can be found in the care plan section) Acute Rehab OT Goals Patient Stated Goal: return to independence with mobility OT Goal Formulation: With patient Time For Goal Achievement: 03/27/16 Potential to Achieve Goals: Good ADL Goals Pt Will Transfer to Toilet: with modified independence;ambulating;bedside commode (over toilet) Pt Will Perform Toileting - Clothing Manipulation and hygiene: with modified independence;sit to/from stand Pt Will Perform Tub/Shower Transfer: Shower transfer;with supervision;ambulating;3 in 1;rolling walker;grab bars Additional ADL Goal #1: Pt will verbalize at least 3 energy conservation strategies. Additional ADL Goal #2: Pt will gather ADL items from around room with RW at modified independent level.  OT Frequency: Min 2X/week   Barriers to D/C:            Co-evaluation              End of Session Equipment Utilized During Treatment: Gait belt;Rolling walker  Activity Tolerance: Patient tolerated treatment well Patient left: in bed;with call bell/phone within reach;with family/visitor present   Time: [id number] OT Time Calculation (min): 21 min Charges:  OT General Charges $OT Visit: 1 Procedure OT Evaluation $OT Eval Moderate Complexity: 1 Procedure G-Codes:    [name] 03/20/2016, 2:45 PM  [phone]

## 2016-03-20 NOTE — Progress Notes (Signed)
Spoke with patients son this morning about the benefits of Home Health/Home Hospice and having a RN available 24 hours a day might help keep his dad out of the hospital. Also reviewed the plan for the day to have Nephrology and cardiology see him, obtain an echo, and possibly due a heart cath and/or place a perm cath. Reminded him to have his mother (patients wife) to bring in the advance directive. Son verbalized understanding.

## 2016-03-20 NOTE — Progress Notes (Signed)
Subjective:  C/O SOB, no CP  Objective:  Temp:  [97.5 F (36.4 C)-98.1 F (36.7 C)] 97.9 F (36.6 C) (07/07 0951) Pulse Rate:  [76-86] 86 (07/07 0951) Resp:  [17-20] 18 (07/07 0951) BP: (139-153)/(58-94) 153/58 mmHg (07/07 0951) SpO2:  [90 %-94 %] 91 % (07/07 0951) Weight:  [196 lb 3.4 oz (89 kg)] 196 lb 3.4 oz (89 kg) (07/06 1841) Weight change:   Intake/Output from previous day: 07/06 0701 - 07/07 0700 In: 240 [P.O.:240] Out: 200 [Urine:200]  Intake/Output from this shift:    Physical Exam: General appearance: alert and no distress Neck: no adenopathy, no carotid bruit, no JVD, supple, symmetrical, trachea midline and thyroid not enlarged, symmetric, no tenderness/mass/nodules Lungs: clear to auscultation bilaterally Heart: regular rate and rhythm, S1, S2 normal, no murmur, click, rub or gallop Extremities: extremities normal, atraumatic, no cyanosis or edema  Lab Results: Results for orders placed or performed during the hospital encounter of 03/19/16 (from the past 48 hour(s))  Glucose, capillary     Status: Abnormal   Collection Time: 03/19/16  6:43 PM  Result Value Ref Range   Glucose-Capillary 332 (H) 65 - 99 mg/dL  Protime-INR     Status: Abnormal   Collection Time: 03/19/16  9:14 PM  Result Value Ref Range   Prothrombin Time 16.3 (H) 11.6 - 15.2 seconds   INR 1.30 0.00 - 1.49  APTT     Status: None   Collection Time: 03/19/16  9:14 PM  Result Value Ref Range   aPTT 37 24 - 37 seconds    Comment:        IF BASELINE aPTT IS ELEVATED, SUGGEST PATIENT RISK ASSESSMENT BE USED TO DETERMINE APPROPRIATE ANTICOAGULANT THERAPY.   Troponin I (q 6hr x 3)     Status: Abnormal   Collection Time: 03/19/16  9:14 PM  Result Value Ref Range   Troponin I 0.94 (HH) <0.03 ng/mL    Comment: CRITICAL RESULT CALLED TO, READ BACK BY AND VERIFIED WITH: L.DESHAZO,RN 2204 03/19/16 M.CAMPBELL   Glucose, capillary     Status: Abnormal   Collection Time: 03/19/16 10:50  PM  Result Value Ref Range   Glucose-Capillary 361 (H) 65 - 99 mg/dL  Basic metabolic panel     Status: Abnormal   Collection Time: 03/20/16  3:08 AM  Result Value Ref Range   Sodium 132 (L) 135 - 145 mmol/L   Potassium 4.6 3.5 - 5.1 mmol/L   Chloride 100 (L) 101 - 111 mmol/L   CO2 19 (L) 22 - 32 mmol/L   Glucose, Bld 358 (H) 65 - 99 mg/dL   BUN 83 (H) 6 - 20 mg/dL   Creatinine, Ser 5.30 (H) 0.61 - 1.24 mg/dL   Calcium 9.0 8.9 - 10.3 mg/dL   GFR calc non Af Amer 9 (L) >60 mL/min   GFR calc Af Amer 11 (L) >60 mL/min    Comment: (NOTE) The eGFR has been calculated using the CKD EPI equation. This calculation has not been validated in all clinical situations. eGFR's persistently <60 mL/min signify possible Chronic Kidney Disease.    Anion gap 13 5 - 15  Troponin I (q 6hr x 3)     Status: Abnormal   Collection Time: 03/20/16  3:08 AM  Result Value Ref Range   Troponin I 0.66 (HH) <0.03 ng/mL    Comment: CRITICAL VALUE NOTED.  VALUE IS CONSISTENT WITH PREVIOUSLY REPORTED AND CALLED VALUE.  Heparin level (unfractionated)  Status: Abnormal   Collection Time: 03/20/16  3:09 AM  Result Value Ref Range   Heparin Unfractionated 0.84 (H) 0.30 - 0.70 IU/mL    Comment:        IF HEPARIN RESULTS ARE BELOW EXPECTED VALUES, AND PATIENT DOSAGE HAS BEEN CONFIRMED, SUGGEST FOLLOW UP TESTING OF ANTITHROMBIN III LEVELS.   APTT     Status: Abnormal   Collection Time: 03/20/16  3:52 AM  Result Value Ref Range   aPTT 56 (H) 24 - 37 seconds    Comment:        IF BASELINE aPTT IS ELEVATED, SUGGEST PATIENT RISK ASSESSMENT BE USED TO DETERMINE APPROPRIATE ANTICOAGULANT THERAPY.   Glucose, capillary     Status: Abnormal   Collection Time: 03/20/16  7:50 AM  Result Value Ref Range   Glucose-Capillary 396 (H) 65 - 99 mg/dL  Troponin I (q 6hr x 3)     Status: Abnormal   Collection Time: 03/20/16  8:07 AM  Result Value Ref Range   Troponin I 0.60 (HH) <0.03 ng/mL    Comment: CRITICAL VALUE  NOTED.  VALUE IS CONSISTENT WITH PREVIOUSLY REPORTED AND CALLED VALUE.  CBC     Status: Abnormal   Collection Time: 03/20/16  8:07 AM  Result Value Ref Range   WBC 6.6 4.0 - 10.5 K/uL   RBC 3.09 (L) 4.22 - 5.81 MIL/uL   Hemoglobin 9.2 (L) 13.0 - 17.0 g/dL   HCT 27.6 (L) 39.0 - 52.0 %   MCV 89.3 78.0 - 100.0 fL   MCH 29.8 26.0 - 34.0 pg   MCHC 33.3 30.0 - 36.0 g/dL   RDW 16.7 (H) 11.5 - 15.5 %   Platelets 313 150 - 400 K/uL    Imaging: Imaging results have been reviewed Diffuse interstitial edema  Te;e- NSR  Assessment/Plan:   1. Principal Problem: 2.   Acute respiratory failure with hypoxia (HCC) 3. Active Problems: 4.   CAD (coronary artery disease) 5.   Chronic systolic (congestive) heart failure (HCC) 6.   CKD (chronic kidney disease), stage IV ([place]) 7.   Atrial fibrillation ([place]) 8.   GERD (gastroesophageal reflux disease) 9.   Essential hypertension 10.   HLD (hyperlipidemia) 11.   DM due to underlying condition with diabetic chronic kidney disease ([place]) 12.   BPH (benign prostatic hyperplasia) 13.   Depression with anxiety 14.   CAP (community acquired pneumonia) 30.   Fluid overload 16.   NSTEMI (non-ST elevated myocardial infarction) ([place]) 17.   Time Spent Directly with Patient:  20 minutes  Length of Stay:  LOS: 1 day   Pt admitted in transfer from [facility]. Has H/O CAD followed by Dr Bettina Gavia in [place]. Remote CABG. Cath since has shown SVG  Dz. Admitted with increasing SOB. Recent MV has shown decreased EF with TID. He has ESRD and prob will require initiation of HD. Will need cor angio but hopefully HD can be initiated prior. Will tentatively plan R/L heart cath Monday pending Renal input.   Quay Burow 03/20/2016, 10:04 AM

## 2016-03-20 NOTE — Evaluation (Signed)
Physical Therapy Evaluation Patient Details Name: Kyle [name] MRN: [id number] DOB: [birth year] Today's Date: 03/20/2016   History of Present Illness  75 y.o. male admitted with acute respiratory failure, CAP, NSTEMI. PMH of DM, BPH, MI, gout, COPD, CKD, CAD.   Clinical Impression  Pt admitted with above diagnosis. Pt currently with functional limitations due to the deficits listed below (see PT Problem List). Pt ambulated 70' with RW, distance limited by dyspnea, SaO2 92% on RA walking.  Pt will benefit from skilled PT to increase their independence and safety with mobility to allow discharge to the venue listed below.       Follow Up Recommendations Supervision for mobility/OOB;Other (comment) (? cardiopulmonary rehab) vs. HHPT depending on progress/activity tolerance    Equipment Recommendations  Other (comment) (rollator for energy conservation)    Recommendations for Other Services       Precautions / Restrictions Precautions Precautions: None Restrictions Weight Bearing Restrictions: No      Mobility  Bed Mobility Overal bed mobility: Modified Independent             General bed mobility comments: with bedrail  Transfers Overall transfer level: Needs assistance Equipment used: Rolling walker (2 wheeled) Transfers: Sit to/from Stand Sit to Stand: Min assist         General transfer comment: VCs hand placement, min A to rise  Ambulation/Gait Ambulation/Gait assistance: Min guard Ambulation Distance (Feet): 18 Feet Assistive device: Rolling walker (2 wheeled) Gait Pattern/deviations: Step-through pattern;Decreased stride length   Gait velocity interpretation: Below normal speed for age/gender General Gait Details: distance limited by 3/4 dyspnea, SaO2 92% on RA, no LOB  Stairs            Wheelchair Mobility    Modified Rankin (Stroke Patients Only)       Balance Overall balance assessment: Modified Independent                                           Pertinent Vitals/Pain Pain Assessment: No/denies pain    Home Living Family/patient expects to be discharged to:: Private residence Living Arrangements: Spouse/significant other Available Help at Discharge: Family;Available 24 hours/day Type of Home: House Home Access: Ramped entrance     Home Layout: One level Home Equipment: Cane - single point      Prior Function Level of Independence: Independent         Comments: independent with ADLs, worked in garden, drives, used cane rarely     Journalist, newspaper        Extremity/Trunk Assessment   Upper Extremity Assessment: Overall WFL for tasks assessed           Lower Extremity Assessment: Overall WFL for tasks assessed      Cervical / Trunk Assessment: Normal  Communication   Communication: No difficulties  Cognition Arousal/Alertness: Awake/alert Behavior During Therapy: WFL for tasks assessed/performed Overall Cognitive Status: Within Functional Limits for tasks assessed                      General Comments      Exercises        Assessment/Plan    PT Assessment Patient needs continued PT services  PT Diagnosis Difficulty walking   PT Problem List Decreased activity tolerance;Decreased mobility;Cardiopulmonary status limiting activity  PT Treatment Interventions Gait training;Functional mobility training;Therapeutic exercise;Therapeutic activities;Patient/family education   PT Goals (Current  goals can be found in the Care Plan section) Acute Rehab PT Goals Patient Stated Goal: return to independence with mobility PT Goal Formulation: With patient/family Time For Goal Achievement: 04/03/16 Potential to Achieve Goals: Good    Frequency Min 3X/week   Barriers to discharge        Co-evaluation               End of Session Equipment Utilized During Treatment: Gait belt Activity Tolerance: Patient limited by fatigue Patient left: in bed;with call  bell/phone within reach;with family/visitor present Nurse Communication: Mobility status         Time: [id number] PT Time Calculation (min) (ACUTE ONLY): 19 min   Charges:   PT Evaluation $PT Eval Low Complexity: 1 Procedure     PT G Codes:        Philomena Doheny 03/20/2016, 12:02 PM

## 2016-03-20 NOTE — Care Management Important Message (Signed)
Important Message  Patient Details  Name: Kyle [name] MRN: [id number] Date of Birth: [birth year]   Medicare Important Message Given:  Yes    Loann Quill 03/20/2016, 10:38 AM

## 2016-03-20 NOTE — Consult Note (Signed)
TRU [facility] Admit Date: 03/19/2016 03/20/2016 Rexene Agent Requesting Physician:  Olen Pel MD  Reason for Consult:  Acute on Chronic Renal Insufficiency HPI:  75 year old male with past medical history of CKD stage IV ([place] [facility], SCr labile and increasing in 3s-4s most recently), systolc heart failure (EF 55% 4/17 -> EF 24% on nuclear stress test 5/17, TTE pending), pulmonary hypertension (RVSP 49 on 01/06/16) with home O2 PRN, former smoker, hypertension, hyperlipidemia, diabetes mellitus, COPD, GERD, gout, depression, anxiety, CAD, s/p of CABG, BPH, anemia, atrial fibrillation on Eliquis, and restless leg syndrome who initially presented to [facility] on 7/3 with shortness of breath.   At [facility], he was found to have fever 101.5, mild leukocytosis of 13.4, oxygen saturations of 70% and CXRY showed possible right basilar aeration disease. He was treated for questionable pulmonary edema vs CAP with Rocephin, Zosyn, Azithromycin, and lasix for volume overload in which improved his shortness of breath. During the hospital course, his trend in troponins elevated from 2.8/ 5.3/6.6/6 without chest pain, and cardiology was consulted and he was empirically started on heparin; sCr was between 3.1 to 4.70. Urinalysis showed protein 3+, glucose 3+, WBC 2-5, epithelial cells 1+, few bacteria, occ mucous.   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 He reports no change in his daily urine output, however it is not a lot. He has had four recent hospitalizations in the last six months for shortness of breath. He reportedly was seen by a nephrologist 3 years ago and dialysis was recommended, however he refused at that time. The patient was scheduled to see Dr. Posey Pronto at the end of July for his progressive renal function.   He was transferred to [facility] on 7/6 for further evaluation by Nephrology and  Cardiology.   Today he says his SOB came on acutely on Sunday evening and no clear DOE or orthopnea. No cough at that time.  Minimal edema on exam, trace L>R.  No NSAID use at home preceding. No IV contrast exposures.  SCr is up to 5.3, electrolytes ok. On lasix gtt at 10mg /hr.  LYing at 10deg in bed with nl resp effort.    CREATININE, SER (mg/dL)  Date Value  03/20/2016 5.30*  ] I/Os:  ROS NSAIDS: none IV Contrast none TMP/SMX none Hypotension none Balance of 12 systems is negative w/ exceptions as above  PMH  Past Medical History  Diagnosis Date  . CAD (coronary artery disease)   . Chronic systolic (congestive) heart failure ([place])   . CKD (chronic kidney disease), stage IV ([place])   . Atrial fibrillation ([place])   . GERD (gastroesophageal reflux disease)   . Essential hypertension   . HLD (hyperlipidemia)   . 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 BPH (benign prostatic hyperplasia)   . Depression with anxiety   . GERD (gastroesophageal reflux disease)   . PONV (postoperative nausea and vomiting)   . Pneumonia   . Shortness of breath dyspnea   . Myocardial infarction ([place])    [place]  Past Surgical History  Procedure Laterality Date  . Tonsillectomy    . Adenoidectomy    . Cardiac bypass    . Coronary artery bypass graft    . Cardiac catheterization    . Eye surgery     FH  Family History  Problem Relation Age of Onset  .  Heart disease Mother   . Heart disease Father   . Lung cancer Brother   . Diabetes Sister    SH  reports that he quit smoking about 60 years ago. He does not have any smokeless tobacco history on file. He reports that he does not drink alcohol or use illicit drugs. Allergies  Allergies  Allergen Reactions  . Statins Other (See Comments)    Pt has tried 4-5 diff kinds   Home medications Prior to Admission medications   Medication Sig Start Date End Date Taking? Authorizing Provider  allopurinol  (ZYLOPRIM) 100 MG tablet Take 100 mg by mouth daily.   Yes Historical Provider, MD  amLODipine (NORVASC) 10 MG tablet Take 10 mg by mouth daily.   Yes Historical Provider, MD  apixaban (ELIQUIS) 2.5 MG TABS tablet Take 2.5 mg by mouth 2 (two) times daily.   Yes Historical Provider, MD  Calcium Carb-Cholecalciferol (CALTRATE 600+D3 SOFT PO) Take 1 tablet by mouth daily.   Yes Historical Provider, MD  carvedilol (COREG) 25 MG tablet Take 25 mg by mouth 2 (two) times daily with a meal.   Yes Historical Provider, MD  cloNIDine (CATAPRES - DOSED IN MG/24 HR) 0.3 mg/24hr patch Place 0.3 mg onto the skin once a week.   Yes Historical Provider, MD  cyanocobalamin (,VITAMIN B-12,) 1000 MCG/ML injection Inject 1,000 mcg into the muscle every 30 (thirty) days.   Yes Historical Provider, MD  ferrous sulfate 325 (65 FE) MG tablet Take 325 mg by mouth daily with breakfast.   Yes Historical Provider, MD  fexofenadine (ALLEGRA) 180 MG tablet Take 180 mg by mouth daily.   Yes Historical Provider, MD  hydrALAZINE (APRESOLINE) 50 MG tablet Take 50 mg by mouth 3 (three) times daily.   Yes Historical Provider, MD  insulin aspart (NOVOLOG) 100 UNIT/ML injection Inject 5 Units into the skin See admin instructions. Per sliding scale   Yes Historical Provider, MD  insulin NPH Human (NOVOLIN N) 100 UNIT/ML injection Inject 35 Units into the skin 2 (two) times daily.   Yes Historical Provider, MD  isosorbide mononitrate (IMDUR) 60 MG 24 hr tablet Take 60 mg by mouth daily.   Yes Historical Provider, MD  Mupirocin (BACTROBAN EX) Apply 1 application topically daily as needed (irritation).   Yes Historical Provider, MD  omeprazole (PRILOSEC) 20 MG capsule Take 20 mg by mouth daily.   Yes Historical Provider, MD  oxyCODONE (OXY IR/ROXICODONE) 5 MG immediate release tablet Take 5 mg by mouth every 6 (six) hours as needed for moderate pain or severe pain.   Yes Historical Provider, MD  Probiotic Product (PROBIOTIC MULTI-ENZYME PO)  Take 1 tablet by mouth daily.   Yes Historical Provider, MD  rOPINIRole (REQUIP) 1 MG tablet Take 1 mg by mouth at bedtime as needed (RLS).   Yes Historical Provider, MD  rosuvastatin (CRESTOR) 20 MG tablet Take 20 mg by mouth daily.   Yes Historical Provider, MD  tamsulosin (FLOMAX) 0.4 MG CAPS capsule Take 0.4 mg by mouth at bedtime.   Yes Historical Provider, MD  torsemide (DEMADEX) 20 MG tablet Take 20 mg by mouth 2 (two) times daily.   Yes Historical Provider, MD  Ubiquinol 50 MG CAPS Take 1 capsule by mouth daily.   Yes Historical Provider, MD    Current Medications Scheduled Meds: . acidophilus  1 capsule Oral Daily  . albuterol  2.5 mg Nebulization BID  . allopurinol  100 mg Oral Daily  . amLODipine  5 mg  Oral Daily  . aspirin EC  81 mg Oral Daily  . azithromycin  250 mg Intravenous Q24H  . carvedilol  25 mg Oral BID WC  . cloNIDine  0.2 mg Transdermal Weekly  . clopidogrel  75 mg Oral Daily  . ferrous sulfate  325 mg Oral Q breakfast  . hydrALAZINE  50 mg Oral Q8H  . insulin aspart  0-9 Units Subcutaneous TID WC  . insulin NPH Human  10 Units Subcutaneous BID AC & HS  . isosorbide mononitrate  60 mg Oral Daily  . loratadine  10 mg Oral Daily  . pantoprazole  40 mg Oral Q1200  . ranolazine  500 mg Oral BID  . rOPINIRole  4 mg Oral QHS  . rosuvastatin  10 mg Oral q1800  . sodium chloride flush  3 mL Intravenous Q12H  . tamsulosin  0.4 mg Oral Daily   Continuous Infusions: . furosemide (LASIX) infusion 10 mg/hr (03/20/16 0259)  . heparin 1,350 Units/hr (03/20/16 0603)   PRN Meds:.acetaminophen **OR** acetaminophen, hydrALAZINE, nitroGLYCERIN, ondansetron **OR** ondansetron (ZOFRAN) IV, oxyCODONE  CBC  Recent Labs Lab 03/20/16 0807  WBC 6.6  HGB 9.2*  HCT 27.6*  MCV 89.3  PLT Q000111Q   Basic Metabolic Panel  Recent Labs Lab 03/20/16 0308  NA 132*  K 4.6  CL 100*  CO2 19*  GLUCOSE 358*  BUN 83*  CREATININE 5.30*  CALCIUM 9.0    Physical Exam  Blood  pressure 153/58, pulse 86, temperature 97.9 F (36.6 C), temperature source Oral, resp. rate 18, height 5\' 8"  (1.727 m), weight 89 kg (196 lb 3.4 oz), SpO2 92 %. GEN: NAD, elderly ENT: NCAT EYES: EOMI CV: IRIR, no rub PULM: bronchial in bases, diminishedi n bases ABD: s/nt/nd SKIN: no rashes/lesions EXT: L>R Trace LEE   Assessment 36M with NSTEMI, suspected acute sCHF exacerbation, ?CAP/COPD exacerbation, and AoCKD4  1. AoCKD4 1. Looksl ike has had progressive inc in SCr over past months, had f/u appt at [facility] in near future 2. Suspect current issues related to CHF/NSTEMI related cardiorenal 3. Need to exclude obstruction, check UA 2. NSTEMI 3. ?systolic CHF exacerbation -- +Pleural effusions and edema 4. CAP on Azithro, defervesced 5. HTN 6. COPD  Plan 1. See how he responds to lasix gtt 2. BID BMP 3. UA, Renal US 4. WOuld like things to settle out before LHC unless strong indication (CP free now, Trop elevated but stable)  Daily weights, Daily Renal Panel, Strict I/Os, Avoid nephrotoxins (NSAIDs, judicious IV Contrast)  Pearson Grippe MD [phone] [phone] 03/20/2016, 12:47 PM

## 2016-03-20 NOTE — Progress Notes (Signed)
Results for MYCAL, SISTARE (MRN [id number]) as of 03/20/2016 15:13  Ref. Range 03/19/2016 18:43 03/19/2016 22:50 03/20/2016 07:50 03/20/2016 12:02  Glucose-Capillary Latest Ref Range: 65-99 mg/dL 332 (H) 361 (H) 396 (H) 389 (H)  Noted that patient's blood sugars continue to be elevated. Recommend increasing NPH to 15-20 units BID and continue Novolog SENSITIVE correction scale TID if eating and every 4 hours if NPO.  Patient takes NPH 35 units BID and Novolog 5 units TID with meals at home. Will continue to monitor blood sugars while in the hospital. Harvel Ricks RN BSN CDE

## 2016-03-20 NOTE — Progress Notes (Signed)
ANTICOAGULATION CONSULT NOTE - Follow Up Consult  Pharmacy Consult for Heparin (apixaban on hold) Indication: chest pain/ACS, afib  Allergies  Allergen Reactions  . Statins Other (See Comments)    Pt has tried 4-5 diff kinds    Patient Measurements: Height: 5\' 8"  (172.7 cm) Weight: 196 lb 3.4 oz (89 kg) IBW/kg (Calculated) : 68.4  Vital Signs: Temp: 97.9 F (36.6 C) (07/07 0951) Temp Source: Oral (07/07 0951) BP: 153/58 mmHg (07/07 0951) Pulse Rate: 86 (07/07 0951)  Labs:  Recent Labs  03/19/16 2114 03/20/16 0308 03/20/16 0309 03/20/16 0352 03/20/16 0807 03/20/16 1327  HGB  --   --   --   --  9.2*  --   HCT  --   --   --   --  27.6*  --   PLT  --   --   --   --  313  --   APTT 37  --   --  56*  --  66*  LABPROT 16.3*  --   --   --   --   --   INR 1.30  --   --   --   --   --   HEPARINUNFRC  --   --  0.84*  --   --   --   CREATININE  --  5.30*  --   --   --   --   TROPONINI 0.94* 0.66*  --   --  0.60*  --     Estimated Creatinine Clearance: 12.8 mL/min (by C-G formula based on Cr of 5.3).   Assessment: Tx from [place], NSTEMI in setting of SOB/renal failure. Initial HL 0.84 so using aPTT to guide heparin dosing.  Most recent aPTT was 66, which is at the lower end of desired range. CBC stable with no reported bleeding.   Goal of Therapy:  Heparin level 0.3-0.7 units/ml aPTT 66-102 seconds Monitor platelets by anticoagulation protocol: Yes   Plan:  1. Increase heparin slightly to 1400 units/hr 2. CBC/HL/aPTT in am  3. Continue using aPTT to dose until HL and aPTT correlate     Vincenza Hews, PharmD, BCPS 03/20/2016, 2:38 PM Pager: [phone]

## 2016-03-20 NOTE — Progress Notes (Signed)
PT Cancellation Note  Patient Details Name: BRAXTYN CRILLEY MRN: [id number] DOB: Dec 27, 1940   Cancelled Treatment:    Reason Eval/Treat Not Completed: Patient at procedure or test/unavailable (EKG, will follow. )   Philomena Doheny 03/20/2016, 9:26 AM [phone]

## 2016-03-20 NOTE — Progress Notes (Addendum)
Patient ID: Kyle [name], male   DOB: 1941/01/24, 75 y.o.   MRN: [id number]   PROGRESS NOTE    Kyle [name]  [id number] DOB: [birth year] DOA: 03/19/2016  PCP: Wende Neighbors, MD   Brief Narrative:  75 y.o. male with hypertension, hyperlipidemia, diabetes mellitus, COPD, GERD, gout, depression, anxiety, CAD, s/p of CABG, congestive heart failure, CKD-IV, BPH, anemia, remote history of alcoholism, atrial fibrillation on Eliquis, presented from [facility] for further evaluation of progressive dyspnea with exertion and occasionally present at rest. Pt presented to [facility] 7/3 and at that time had fevers up to 101.26F and oxygen saturations in 70's. He was treated with IV Rocephin, Zithromax, Zosyn, IV lasix. His troponins were elevated and continued to increase 2.8>>5.3>> 6.6>>6.0, consistent with NSTEMI. Cardiology was consulted, patient was treated with heparin drip. Of note, pt has CKD-IV, and was seen by renal in the past, HD recommended even 3 years ago, but patient refused dialysis in the past.   Assessment & Plan:   Principal Problem:   Acute respiratory failure with hypoxia ([place]) - secondary to acute systolic CHF, ? CAP, AoCKD4 - currently on lasix drip and will therefore need to transfer to stepdown unit  - management per nephrology and cardiology teams - pt currently off oxygen but can place on [place] if needed to maintain oxygen saturations > 92%  Active Problems:   NSTEMI, acute systolic CHF - cardiology will plan R/L heart cath Monday  - transfer to SDU as pt on lasix drip     Acute on chronic kidney disease stage IV - with progressive increase in Cr in the past few months  - now Cr up likely from cardiorenal etiology, NSTEMI - nephrology team following, appreciate assistance  - plan for renal US - strict I/O, daily weights     Atrial fibrillation ([place]), chronic, CHADS2 vasc score 4-5 - on Eliquis at home, holding for now - currently on Heparin drip    Essential hypertension - reasonable inpatient control  - on Hydralazine, Imdur, Coreg, Clonidine     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 Bilateral PNA, unknown pathogen - currently on Zithromax and will continue for now    Obesity - Body mass index is 29.84 kg/(m^2).   DVT prophylaxis: on Heparin drip  Code Status: Partial code, no intubation but OK with everything else per ACLS protocol if needed  Family Communication: Patient and son at bedside  Disposition Plan: Home once consultants clear   Consultants:   Cardiology  Nephrology  PT/OT  Procedures:   None  Antimicrobials:   Zithromax 7/6 -->   Subjective: Reports feeling better.   Objective: Filed Vitals:   03/20/16 0504 03/20/16 0846 03/20/16 0951 03/20/16 1157  BP: 146/62  153/58   Pulse: 85 86 86   Temp: 98.1 F (36.7 C)  97.9 F (36.6 C)   TempSrc: Oral  Oral   Resp: 17 18 18    Height:      Weight:      SpO2: 90% 91% 91% 92%    Intake/Output Summary (Last 24 hours) at 03/20/16 1700 Last data filed at 03/20/16 1657  Gross per 24 hour  Intake 485.63 ml  Output    230 ml  Net 255.63 ml   Filed Weights   03/19/16  1841  Weight: 89 kg (196 lb 3.4 oz)    Examination:  General exam: Appears calm and comfortable  Respiratory system: Respiratory effort normal. Diminished breath sounds at bases  Cardiovascular system: IRRR. No JVD, rubs, gallops or clicks.Trace bilateral LE edema Gastrointestinal system: Abdomen is slightly distended, soft and nontender.  Central nervous system: Alert and oriented. No focal neurological deficits. Extremities: Symmetric 5 x 5 power. Skin: No rashes, lesions or ulcers Psychiatry: Judgement and insight appear normal. Mood & affect appropriate.   Data Reviewed: I have personally reviewed following labs  and imaging studies  CBC:  Recent Labs Lab 03/20/16 0807  WBC 6.6  HGB 9.2*  HCT 27.6*  MCV 89.3  PLT Q000111Q   Basic Metabolic Panel:  Recent Labs Lab 03/20/16 0308  NA 132*  K 4.6  CL 100*  CO2 19*  GLUCOSE 358*  BUN 83*  CREATININE 5.30*  CALCIUM 9.0   Coagulation Profile:  Recent Labs Lab 03/19/16 2114  INR 1.30   Cardiac Enzymes:  Recent Labs Lab 03/19/16 2114 03/20/16 0308 03/20/16 0807  TROPONINI 0.94* 0.66* 0.60*   CBG:  Recent Labs Lab 03/19/16 1843 03/19/16 2250 03/20/16 0750 03/20/16 1202  GLUCAP 332* 361* 396* 389*   Urine analysis:    Component Value Date/Time   COLORURINE YELLOW 03/20/2016 1354   APPEARANCEUR CLOUDY* 03/20/2016 1354   LABSPEC 1.020 03/20/2016 1354   PHURINE 5.0 03/20/2016 1354   GLUCOSEU 250* 03/20/2016 1354   HGBUR NEGATIVE 03/20/2016 1354   BILIRUBINUR NEGATIVE 03/20/2016 1354   KETONESUR NEGATIVE 03/20/2016 1354   PROTEINUR 100* 03/20/2016 1354   NITRITE NEGATIVE 03/20/2016 1354   LEUKOCYTESUR MODERATE* 03/20/2016 1354   Radiology Studies: Dg Chest 2 View  03/20/2016  CLINICAL DATA:  Shortness of breath, weakness, acute respiratory failure and pneumonia, CHF, chronic renal insufficiency EXAM: CHEST  2 VIEW COMPARISON:  None in PACs FINDINGS: The lungs are adequately inflated. The interstitial markings are increased bilaterally. Confluent density is present in the lower lobes posteriorly. There are small bilateral pleural effusions layering posteriorly. The heart is normal in size. There are sternal wires from previous CABG. There is aortic atherosclerosis. The bony thorax exhibits no acute abnormality. IMPRESSION: Diffuse interstitial prominence consistent with interstitial edema or pneumonia with confluent consolidation in both lower lobes with small pleural effusions. Post CABG changes. Aortic atherosclerosis. Electronically Signed   By: David  Martinique M.D.   On: 03/20/2016 07:31      Scheduled Meds: .  acidophilus  1 capsule Oral Daily  . albuterol  2.5 mg Nebulization BID  . allopurinol  100 mg Oral Daily  . amLODipine  5 mg Oral Daily  . aspirin EC  81 mg Oral Daily  . azithromycin  250 mg Intravenous Q24H  . carvedilol  25 mg Oral BID WC  . cloNIDine  0.2 mg Transdermal Weekly  . clopidogrel  75 mg Oral Daily  . ferrous sulfate  325 mg Oral Q breakfast  . hydrALAZINE  50 mg Oral Q8H  . insulin aspart  0-9 Units Subcutaneous TID WC  . insulin NPH Human  10 Units Subcutaneous BID AC & HS  . isosorbide mononitrate  60 mg Oral Daily  . loratadine  10 mg Oral Daily  . pantoprazole  40 mg Oral Q1200  . ranolazine  500 mg Oral BID  . rOPINIRole  4 mg Oral QHS  . rosuvastatin  10 mg Oral q1800  . sodium chloride flush  3 mL Intravenous Q12H  .  tamsulosin  0.4 mg Oral Daily   Continuous Infusions: . furosemide (LASIX) infusion 10 mg/hr (03/20/16 0259)  . heparin 1,400 Units/hr (03/20/16 1455)     LOS: 1 day    Time spent: 20 minutes    Faye Ramsay, MD [facility] Pager [phone]  If 7PM-7AM, please contact night-coverage [email or web address] Password [email or web address] 03/20/2016, 5:00 PM

## 2016-03-20 NOTE — Progress Notes (Signed)
Patient to transfer to [id number]. Report called to [place], Therapist, sports. Bartholomew Crews, RN

## 2016-03-20 NOTE — Progress Notes (Signed)
ANTICOAGULATION CONSULT NOTE - Follow Up Consult  Pharmacy Consult for Heparin (apixaban on hold) Indication: chest pain/ACS, afib  No Known Allergies  Patient Measurements: Height: 5\' 8"  (172.7 cm) Weight: 196 lb 3.4 oz (89 kg) IBW/kg (Calculated) : 68.4  Vital Signs: Temp: 97.5 F (36.4 C) (07/06 1938) Temp Source: Oral (07/06 1938) BP: 139/94 mmHg (07/06 1938) Pulse Rate: 80 (07/06 1938)  Labs:  Recent Labs  03/19/16 2114 03/20/16 0308 03/20/16 0309 03/20/16 0352  APTT 37  --   --  56*  LABPROT 16.3*  --   --   --   INR 1.30  --   --   --   HEPARINUNFRC  --   --  0.84*  --   CREATININE  --  5.30*  --   --   TROPONINI 0.94* 0.66*  --   --     Estimated Creatinine Clearance: 12.8 mL/min (by C-G formula based on Cr of 5.3).   Assessment: Tx from [place], NSTEMI in setting of SOB/renal failure, HL of 0.84 and aPTT of 56, although last dose of apixaban was several days ago it appears to still be affecting the anti-Xa level, will use aPTT to dose for now.   Goal of Therapy:  Heparin level 0.3-0.7 units/ml aPTT 66-102 seconds Monitor platelets by anticoagulation protocol: Yes   Plan:  -Increase heparin to 1350 units/hr -1300 aPTT -Daily CBC/HL/aPTT -Continue using aPTT to dose until HL and aPTT correlate  -Monitor for bleeding  Narda Bonds 03/20/2016,4:28 AM

## 2016-03-21 DIAGNOSIS — N184 Chronic kidney disease, stage 4 (severe): Secondary | ICD-10-CM | POA: Diagnosis not present

## 2016-03-21 DIAGNOSIS — J449 Chronic obstructive pulmonary disease, unspecified: Secondary | ICD-10-CM | POA: Diagnosis not present

## 2016-03-21 DIAGNOSIS — I482 Chronic atrial fibrillation: Secondary | ICD-10-CM | POA: Diagnosis not present

## 2016-03-21 DIAGNOSIS — N179 Acute kidney failure, unspecified: Secondary | ICD-10-CM | POA: Diagnosis not present

## 2016-03-21 DIAGNOSIS — I480 Paroxysmal atrial fibrillation: Secondary | ICD-10-CM | POA: Diagnosis not present

## 2016-03-21 DIAGNOSIS — J9601 Acute respiratory failure with hypoxia: Secondary | ICD-10-CM | POA: Diagnosis not present

## 2016-03-21 DIAGNOSIS — I214 Non-ST elevation (NSTEMI) myocardial infarction: Secondary | ICD-10-CM | POA: Diagnosis not present

## 2016-03-21 DIAGNOSIS — I25119 Atherosclerotic heart disease of native coronary artery with unspecified angina pectoris: Secondary | ICD-10-CM | POA: Diagnosis not present

## 2016-03-21 DIAGNOSIS — I129 Hypertensive chronic kidney disease with stage 1 through stage 4 chronic kidney disease, or unspecified chronic kidney disease: Secondary | ICD-10-CM | POA: Diagnosis not present

## 2016-03-21 LAB — HEMOGLOBIN A1C
Hgb A1c MFr Bld: 10.4 % — ABNORMAL HIGH (ref 4.8–5.6)
MEAN PLASMA GLUCOSE: 252 mg/dL

## 2016-03-21 LAB — BASIC METABOLIC PANEL
Anion gap: 11 (ref 5–15)
BUN: 93 mg/dL — AB (ref 6–20)
CO2: 20 mmol/L — ABNORMAL LOW (ref 22–32)
Calcium: 8.7 mg/dL — ABNORMAL LOW (ref 8.9–10.3)
Chloride: 100 mmol/L — ABNORMAL LOW (ref 101–111)
Creatinine, Ser: 5.45 mg/dL — ABNORMAL HIGH (ref 0.61–1.24)
GFR, EST AFRICAN AMERICAN: 11 mL/min — AB (ref >60)
GFR, EST NON AFRICAN AMERICAN: 9 mL/min — AB (ref >60)
Glucose, Bld: 306 mg/dL — ABNORMAL HIGH (ref 65–99)
POTASSIUM: 4 mmol/L (ref 3.5–5.1)
SODIUM: 131 mmol/L — AB (ref 135–145)

## 2016-03-21 LAB — RENAL FUNCTION PANEL
ALBUMIN: 2.5 g/dL — AB (ref 3.5–5.0)
ANION GAP: 11 (ref 5–15)
BUN: 92 mg/dL — AB (ref 6–20)
CO2: 20 mmol/L — ABNORMAL LOW (ref 22–32)
Calcium: 8.8 mg/dL — ABNORMAL LOW (ref 8.9–10.3)
Chloride: 99 mmol/L — ABNORMAL LOW (ref 101–111)
Creatinine, Ser: 5.36 mg/dL — ABNORMAL HIGH (ref 0.61–1.24)
GFR, EST AFRICAN AMERICAN: 11 mL/min — AB (ref >60)
GFR, EST NON AFRICAN AMERICAN: 9 mL/min — AB (ref >60)
Glucose, Bld: 339 mg/dL — ABNORMAL HIGH (ref 65–99)
PHOSPHORUS: 5.2 mg/dL — AB (ref 2.5–4.6)
POTASSIUM: 4 mmol/L (ref 3.5–5.1)
Sodium: 130 mmol/L — ABNORMAL LOW (ref 135–145)

## 2016-03-21 LAB — CBC
HEMATOCRIT: 26.7 % — AB (ref 39.0–52.0)
HEMOGLOBIN: 8.6 g/dL — AB (ref 13.0–17.0)
MCH: 28.5 pg (ref 26.0–34.0)
MCHC: 32.2 g/dL (ref 30.0–36.0)
MCV: 88.4 fL (ref 78.0–100.0)
Platelets: 310 10*3/uL (ref 150–400)
RBC: 3.02 MIL/uL — AB (ref 4.22–5.81)
RDW: 16.5 % — ABNORMAL HIGH (ref 11.5–15.5)
WBC: 6.3 10*3/uL (ref 4.0–10.5)

## 2016-03-21 LAB — GLUCOSE, CAPILLARY
GLUCOSE-CAPILLARY: 298 mg/dL — AB (ref 65–99)
GLUCOSE-CAPILLARY: 239 mg/dL — AB (ref 65–99)
GLUCOSE-CAPILLARY: 300 mg/dL — AB (ref 65–99)
Glucose-Capillary: 293 mg/dL — ABNORMAL HIGH (ref 65–99)
Glucose-Capillary: 244 mg/dL — ABNORMAL HIGH (ref 65–99)

## 2016-03-21 LAB — APTT
aPTT: 89 seconds — ABNORMAL HIGH (ref 24–37)
aPTT: 104 seconds — ABNORMAL HIGH (ref 24–37)

## 2016-03-21 LAB — HEPARIN LEVEL (UNFRACTIONATED): Heparin Unfractionated: 0.92 IU/mL — ABNORMAL HIGH (ref 0.30–0.70)

## 2016-03-21 MED ORDER — INSULIN NPH (HUMAN) (ISOPHANE) 100 UNIT/ML ~~LOC~~ SUSP
20.0000 [IU] | Freq: Two times a day (BID) | SUBCUTANEOUS | Status: DC
  Administered 2016-03-21 – 2016-03-26 (×6): 20 [IU] via SUBCUTANEOUS
  Filled 2016-03-21: qty 10

## 2016-03-21 MED ORDER — ALBUTEROL SULFATE (2.5 MG/3ML) 0.083% IN NEBU: 2.5000 mg | INHALATION_SOLUTION | RESPIRATORY_TRACT | Status: DC | PRN

## 2016-03-21 NOTE — Progress Notes (Signed)
ANTICOAGULATION CONSULT NOTE - Follow Up Consult  Pharmacy Consult for Heparin (apixaban on hold) Indication: chest pain/ACS, afib  Allergies  Allergen Reactions  . Statins Other (See Comments)    Pt has tried 4-5 diff kinds. 03/20/2016 Pt reports no reaction or allergy, but has tried different types of statins to figure out which works best for him.     Patient Measurements: Height: 5\' 8"  (172.7 cm) Weight: 194 lb 3.2 oz (88.089 kg) IBW/kg (Calculated) : 68.4  Vital Signs: Temp: 97.7 F (36.5 C) (07/08 0506) Temp Source: Oral (07/08 0506) BP: 137/57 mmHg (07/08 0506) Pulse Rate: 74 (07/08 0506)  Labs:  Recent Labs  03/19/16 2114 03/20/16 0308 03/20/16 0309 03/20/16 0352 03/20/16 0807 03/20/16 1327 03/20/16 2022 03/21/16 0415  HGB  --   --   --   --  9.2*  --   --  8.6*  HCT  --   --   --   --  27.6*  --   --  26.7*  PLT  --   --   --   --  313  --   --  310  APTT 37  --   --  56*  --  66*  --  89*  LABPROT 16.3*  --   --   --   --   --   --   --   INR 1.30  --   --   --   --   --   --   --   HEPARINUNFRC  --   --  0.84*  --   --   --   --  0.92*  CREATININE  --  5.30*  --   --   --   --  5.24*  --   TROPONINI 0.94* 0.66*  --   --  0.60*  --   --   --     Estimated Creatinine Clearance: 12.9 mL/min (by C-G formula based on Cr of 5.24).   Assessment: Tx from [place], NSTEMI in setting of SOB/renal failure. On Eliquis 2.5mg  BID PTA. Last dose 7/2 AM. Started on heparin at [facility] for NSTEMI. To continue heparin for now. HL this am is supratherapeutic at 0.92, aPTT is therapeutic at 89. Levels are not correlating yet. Hgb low but stable at 8.6, plts wnl.  Goal of Therapy:  Heparin level 0.3-0.7 units/ml aPTT 66-102 seconds Monitor platelets by anticoagulation protocol: Yes   Plan:  Continue heparin gtt at 1,400 units/hr Check 8 hr aPTT Monitor daily aPTT / HL, CBC, s/s of bleed   Elenor Quinones, PharmD, [facility] Clinical Pharmacist Pager [phone] 03/21/2016  7:54 AM

## 2016-03-21 NOTE — Progress Notes (Signed)
Pt had large BM this am, and he did notify nurse he had voided a large amount, but was unable to use urinal while in bathroom.

## 2016-03-21 NOTE — Progress Notes (Addendum)
Subjective:   75 y.o. male with medical history significant of hypertension, hyperlipidemia, diabetes mellitus, COPD, GERD, gout, depression, anxiety, CAD, s/p of CABG, congestive heart failure, CKD-IV, BPH, anemia, remote history of alcoholism, atrial fibrillation on Eliquis, restless leg syndrome, who presents with a shortness of breath  The patient has a baseline creatinine of 5. He is scheduled tentatively for cardiac catheterization on Monday. He'll need to be set up for dialysis in the next day or so.  He has been seen by nephrology and they would like to determine whether or not he'll respond to Lasix before has a heart catheter station.  Objective:  Temp:  [97.7 F (36.5 C)-98.7 F (37.1 C)] 97.7 F (36.5 C) (07/08 0506) Pulse Rate:  [73-77] 74 (07/08 0506) Resp:  [18-20] 18 (07/08 0506) BP: (128-137)/(49-58) 137/57 mmHg (07/08 0506) SpO2:  [92 %-95 %] 94 % (07/08 0506) Weight:  [194 lb 3.2 oz (88.089 kg)] 194 lb 3.2 oz (88.089 kg) (07/08 0506) Weight change: -2 lb 0.2 oz (-0.911 kg)  Intake/Output from previous day: 07/07 0701 - 07/08 0700 In: 148.3 [I.V.:148.3] Out: 255 [Urine:255]  Intake/Output from this shift: Total I/O In: 360 [P.O.:360] Out: -   Physical Exam: General appearance: alert and no distress Neck: no adenopathy, no carotid bruit, no JVD, supple, symmetrical, trachea midline and thyroid not enlarged, symmetric, no tenderness/mass/nodules Lungs: clear to auscultation bilaterally Heart: regular rate and rhythm, S1, S2 normal, no murmur, click, rub or gallop Extremities: extremities normal, atraumatic, no cyanosis or edema  Lab Results: Results for orders placed or performed during the hospital encounter of 03/19/16 (from the past 48 hour(s))  Glucose, capillary     Status: Abnormal   Collection Time: 03/19/16  6:43 PM  Result Value Ref Range   Glucose-Capillary 332 (H) 65 - 99 mg/dL  Protime-INR     Status: Abnormal   Collection Time:  03/19/16  9:14 PM  Result Value Ref Range   Prothrombin Time 16.3 (H) 11.6 - 15.2 seconds   INR 1.30 0.00 - 1.49  APTT     Status: None   Collection Time: 03/19/16  9:14 PM  Result Value Ref Range   aPTT 37 24 - 37 seconds    Comment:        IF BASELINE aPTT IS ELEVATED, SUGGEST PATIENT RISK ASSESSMENT BE USED TO DETERMINE APPROPRIATE ANTICOAGULANT THERAPY.   Hemoglobin A1c     Status: Abnormal   Collection Time: 03/19/16  9:14 PM  Result Value Ref Range   Hgb A1c MFr Bld 10.4 (H) 4.8 - 5.6 %    Comment: (NOTE)         Pre-diabetes: 5.7 - 6.4         Diabetes: >6.4         Glycemic control for adults with diabetes: <7.0    Mean Plasma Glucose 252 mg/dL    Comment: (NOTE) Performed At: [facility] [place], [place] [id number] Lindon Romp MD [id number]   Troponin I (q 6hr x 3)     Status: Abnormal   Collection Time: 03/19/16  9:14 PM  Result Value Ref Range   Troponin I 0.94 (HH) <0.03 ng/mL    Comment: CRITICAL RESULT CALLED TO, READ BACK BY AND VERIFIED WITH: L.DESHAZO,RN 2204 03/19/16 M.CAMPBELL   Glucose, capillary     Status: Abnormal   Collection Time: 03/19/16 10:50 PM  Result Value Ref Range   Glucose-Capillary 361 (H) 65 - 99 mg/dL  Basic metabolic panel     Status: Abnormal   Collection Time: 03/20/16  3:08 AM  Result Value Ref Range   Sodium 132 (L) 135 - 145 mmol/L   Potassium 4.6 3.5 - 5.1 mmol/L   Chloride 100 (L) 101 - 111 mmol/L   CO2 19 (L) 22 - 32 mmol/L   Glucose, Bld 358 (H) 65 - 99 mg/dL   BUN 83 (H) 6 - 20 mg/dL   Creatinine, Ser 5.30 (H) 0.61 - 1.24 mg/dL   Calcium 9.0 8.9 - 10.3 mg/dL   GFR calc non Af Amer 9 (L) >60 mL/min   GFR calc Af Amer 11 (L) >60 mL/min    Comment: (NOTE) The eGFR has been calculated using the CKD EPI equation. This calculation has not been validated in all clinical situations. eGFR's persistently <60 mL/min signify possible Chronic Kidney Disease.    Anion gap 13 5 - 15  Troponin  I (q 6hr x 3)     Status: Abnormal   Collection Time: 03/20/16  3:08 AM  Result Value Ref Range   Troponin I 0.66 (HH) <0.03 ng/mL    Comment: CRITICAL VALUE NOTED.  VALUE IS CONSISTENT WITH PREVIOUSLY REPORTED AND CALLED VALUE.  Heparin level (unfractionated)     Status: Abnormal   Collection Time: 03/20/16  3:09 AM  Result Value Ref Range   Heparin Unfractionated 0.84 (H) 0.30 - 0.70 IU/mL    Comment:        IF HEPARIN RESULTS ARE BELOW EXPECTED VALUES, AND PATIENT DOSAGE HAS BEEN CONFIRMED, SUGGEST FOLLOW UP TESTING OF ANTITHROMBIN III LEVELS.   APTT     Status: Abnormal   Collection Time: 03/20/16  3:52 AM  Result Value Ref Range   aPTT 56 (H) 24 - 37 seconds    Comment:        IF BASELINE aPTT IS ELEVATED, SUGGEST PATIENT RISK ASSESSMENT BE USED TO DETERMINE APPROPRIATE ANTICOAGULANT THERAPY.   Glucose, capillary     Status: Abnormal   Collection Time: 03/20/16  7:50 AM  Result Value Ref Range   Glucose-Capillary 396 (H) 65 - 99 mg/dL  Troponin I (q 6hr x 3)     Status: Abnormal   Collection Time: 03/20/16  8:07 AM  Result Value Ref Range   Troponin I 0.60 (HH) <0.03 ng/mL    Comment: CRITICAL VALUE NOTED.  VALUE IS CONSISTENT WITH PREVIOUSLY REPORTED AND CALLED VALUE.  CBC     Status: Abnormal   Collection Time: 03/20/16  8:07 AM  Result Value Ref Range   WBC 6.6 4.0 - 10.5 K/uL   RBC 3.09 (L) 4.22 - 5.81 MIL/uL   Hemoglobin 9.2 (L) 13.0 - 17.0 g/dL   HCT 27.6 (L) 39.0 - 52.0 %   MCV 89.3 78.0 - 100.0 fL   MCH 29.8 26.0 - 34.0 pg   MCHC 33.3 30.0 - 36.0 g/dL   RDW 16.7 (H) 11.5 - 15.5 %   Platelets 313 150 - 400 K/uL  Glucose, capillary     Status: Abnormal   Collection Time: 03/20/16 12:02 PM  Result Value Ref Range   Glucose-Capillary 389 (H) 65 - 99 mg/dL  APTT     Status: Abnormal   Collection Time: 03/20/16  1:27 PM  Result Value Ref Range   aPTT 66 (H) 24 - 37 seconds    Comment:        IF BASELINE aPTT IS ELEVATED, SUGGEST PATIENT RISK  ASSESSMENT BE USED TO DETERMINE APPROPRIATE ANTICOAGULANT  THERAPY.   Urinalysis, Routine w reflex microscopic (not at [facility])     Status: Abnormal   Collection Time: 03/20/16  1:54 PM  Result Value Ref Range   Color, Urine YELLOW YELLOW   APPearance CLOUDY (A) CLEAR   Specific Gravity, Urine 1.020 1.005 - 1.030   pH 5.0 5.0 - 8.0   Glucose, UA 250 (A) NEGATIVE mg/dL   Hgb urine dipstick NEGATIVE NEGATIVE   Bilirubin Urine NEGATIVE NEGATIVE   Ketones, ur NEGATIVE NEGATIVE mg/dL   Protein, ur 100 (A) NEGATIVE mg/dL   Nitrite NEGATIVE NEGATIVE   Leukocytes, UA MODERATE (A) NEGATIVE  Urine microscopic-add on     Status: Abnormal   Collection Time: 03/20/16  1:54 PM  Result Value Ref Range   Squamous Epithelial / LPF 6-30 (A) NONE SEEN   WBC, UA 6-30 0 - 5 WBC/hpf   RBC / HPF 0-5 0 - 5 RBC/hpf   Bacteria, UA FEW (A) NONE SEEN   Casts GRANULAR CAST (A) NEGATIVE    Comment: WAXY CAST  Glucose, capillary     Status: Abnormal   Collection Time: 03/20/16  5:05 PM  Result Value Ref Range   Glucose-Capillary 299 (H) 65 - 99 mg/dL  MRSA PCR Screening     Status: None   Collection Time: 03/20/16  6:15 PM  Result Value Ref Range   MRSA by PCR NEGATIVE NEGATIVE    Comment:        The GeneXpert MRSA Assay (FDA approved for NASAL specimens only), is one component of a comprehensive MRSA colonization surveillance program. It is not intended to diagnose MRSA infection nor to guide or monitor treatment for MRSA infections.   Basic metabolic panel     Status: Abnormal   Collection Time: 03/20/16  8:22 PM  Result Value Ref Range   Sodium 130 (L) 135 - 145 mmol/L   Potassium 4.0 3.5 - 5.1 mmol/L   Chloride 101 101 - 111 mmol/L   CO2 20 (L) 22 - 32 mmol/L   Glucose, Bld 279 (H) 65 - 99 mg/dL   BUN 90 (H) 6 - 20 mg/dL   Creatinine, Ser 5.24 (H) 0.61 - 1.24 mg/dL   Calcium 8.7 (L) 8.9 - 10.3 mg/dL   GFR calc non Af Amer 10 (L) >60 mL/min   GFR calc Af Amer 11 (L) >60 mL/min    Comment:  (NOTE) The eGFR has been calculated using the CKD EPI equation. This calculation has not been validated in all clinical situations. eGFR's persistently <60 mL/min signify possible Chronic Kidney Disease.    Anion gap 9 5 - 15  Glucose, capillary     Status: Abnormal   Collection Time: 03/20/16  8:53 PM  Result Value Ref Range   Glucose-Capillary 273 (H) 65 - 99 mg/dL  Heparin level (unfractionated)     Status: Abnormal   Collection Time: 03/21/16  4:15 AM  Result Value Ref Range   Heparin Unfractionated 0.92 (H) 0.30 - 0.70 IU/mL    Comment:        IF HEPARIN RESULTS ARE BELOW EXPECTED VALUES, AND PATIENT DOSAGE HAS BEEN CONFIRMED, SUGGEST FOLLOW UP TESTING OF ANTITHROMBIN III LEVELS.   CBC     Status: Abnormal   Collection Time: 03/21/16  4:15 AM  Result Value Ref Range   WBC 6.3 4.0 - 10.5 K/uL   RBC 3.02 (L) 4.22 - 5.81 MIL/uL   Hemoglobin 8.6 (L) 13.0 - 17.0 g/dL   HCT 26.7 (L) 39.0 -  52.0 %   MCV 88.4 78.0 - 100.0 fL   MCH 28.5 26.0 - 34.0 pg   MCHC 32.2 30.0 - 36.0 g/dL   RDW 16.5 (H) 11.5 - 15.5 %   Platelets 310 150 - 400 K/uL  APTT     Status: Abnormal   Collection Time: 03/21/16  4:15 AM  Result Value Ref Range   aPTT 89 (H) 24 - 37 seconds    Comment:        IF BASELINE aPTT IS ELEVATED, SUGGEST PATIENT RISK ASSESSMENT BE USED TO DETERMINE APPROPRIATE ANTICOAGULANT THERAPY.   Glucose, capillary     Status: Abnormal   Collection Time: 03/21/16  7:30 AM  Result Value Ref Range   Glucose-Capillary 293 (H) 65 - 99 mg/dL    Imaging: Imaging results have been reviewed Diffuse interstitial edema  Tele- personally reviewed.  NSR  Assessment/Plan:   1. CAD :   Has known coronary artery disease and has had a coronary artery bypass grafting. He now presents with a non-ST segment elevation myocardial infarction. Needs to have repeat catheterization but with his chronic kidney disease stage IV, he needs to have dialysis issues worked out before we do heart  Catheterization. Fortunately he is pain-free and breathing comfortably.  2. Chronic kidney disease. His creatinine is above 5. Nephrology is following.  3. COPD:  4. Hyperlipidemia:  On Crestor 10 mg a day   5. Paroxysmal atrial fib: . He remains in normal sinus rhythm.   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

## 2016-03-21 NOTE — Progress Notes (Signed)
ANTICOAGULATION CONSULT NOTE - Follow Up Consult  Pharmacy Consult for Heparin (apixaban on hold) Indication: chest pain/ACS, afib  Allergies  Allergen Reactions  . Statins Other (See Comments)    Pt has tried 4-5 diff kinds. 03/20/2016 Pt reports no reaction or allergy, but has tried different types of statins to figure out which works best for him.     Patient Measurements: Height: 5\' 8"  (172.7 cm) Weight: 194 lb 3.2 oz (88.089 kg) IBW/kg (Calculated) : 68.4  Vital Signs: Temp: 97.7 F (36.5 C) (07/08 0506) Temp Source: Oral (07/08 0506) BP: 137/57 mmHg (07/08 0506) Pulse Rate: 74 (07/08 0506)  Labs:  Recent Labs  03/19/16 2114 03/20/16 0308 03/20/16 0309  03/20/16 [id number] 03/20/16 1327 03/20/16 2022 03/21/16 0415 03/21/16 0949 03/21/16 1153  HGB  --   --   --   --  9.2*  --   --  8.6*  --   --   HCT  --   --   --   --  27.6*  --   --  26.7*  --   --   PLT  --   --   --   --  313  --   --  310  --   --   APTT 37  --   --   < >  --  66*  --  89*  --  104*  LABPROT 16.3*  --   --   --   --   --   --   --   --   --   INR 1.30  --   --   --   --   --   --   --   --   --   HEPARINUNFRC  --   --  0.84*  --   --   --   --  0.92*  --   --   CREATININE  --  5.30*  --   --   --   --  5.24*  --  5.36*  --   TROPONINI 0.94* 0.66*  --   --  0.60*  --   --   --   --   --   < > = values in this interval not displayed.  Estimated Creatinine Clearance: 12.7 mL/min (by C-G formula based on Cr of 5.36).   Assessment: Tx from [place], NSTEMI in setting of SOB/renal failure. On Eliquis 2.5mg  BID PTA. Last dose 7/2 AM. Started on heparin at [facility] for NSTEMI. To continue heparin for now. HL this am is supratherapeutic at 0.92, aPTT is therapeutic at 89. Levels are not correlating yet. Hgb low but stable at 8.6, plts wnl.  Goal of Therapy:  Heparin level 0.3-0.7 units/ml aPTT 66-102 seconds Monitor platelets by anticoagulation protocol: Yes   Plan:  Continue heparin gtt at 1,400  units/hr Check 8 hr aPTT Monitor daily aPTT / HL, CBC, s/s of bleed   Elenor Quinones, PharmD, BCPS Clinical Pharmacist Pager [phone] 03/21/2016 2:18 PM  ADDENDUM:  Last aPTT now borderline high at 104.   Plan: Decrease heparin gtt to 1,350 units/hr Check 8 hr aPTT Monitor daily HL / aPTT, CBC, s/s of bleed

## 2016-03-21 NOTE — Progress Notes (Signed)
Patient ID: Kyle [name], male   DOB: [birth year], 75 y.o.   MRN: [id number]   PROGRESS NOTE    Kyle [name]  [id number] DOB: 09-15-1940 DOA: 03/19/2016  PCP: Wende Neighbors, MD   Brief Narrative:  75 y.o. male with hypertension, hyperlipidemia, diabetes mellitus, COPD, GERD, gout, depression, anxiety, CAD, s/p of CABG, congestive heart failure, CKD-IV, BPH, anemia, remote history of alcoholism, atrial fibrillation on Eliquis, presented from [facility] for further evaluation of progressive dyspnea with exertion and occasionally present at rest. Pt presented to [facility] 7/3 and at that time had fevers up to 101.33F and oxygen saturations in 70's. He was treated with IV Rocephin, Zithromax, Zosyn, IV lasix. His troponins were elevated and continued to increase 2.8>>5.3>> 6.6>>6.0, consistent with NSTEMI. Cardiology was consulted, patient was treated with heparin drip. Of note, pt has CKD-IV, and was seen by renal in the past, HD recommended even 3 years ago, but patient refused dialysis in the past.   Assessment & Plan:   Principal Problem:   Acute respiratory failure with hypoxia (HCC) - secondary to acute systolic CHF, ? CAP, AoCKD4 - currently on lasix drip, managed by nephrology team  - pt currently off oxygen, comfortable, maintaining oxygen saturation at target range   Active Problems:   NSTEMI, acute systolic CHF - cardiology will plan R/L heart cath Monday  - appreciate cardio team following     Acute on chronic kidney disease stage IV - with progressive increase in Cr in the past few months  - now Cr up likely from cardiorenal etiology, NSTEMI - nephrology team following, appreciate assistance  - renal US unremarkable  - strict I/O, daily weights     Atrial fibrillation ([place]), chronic, CHADS2 vasc score 4-5 - on Eliquis at home, holding for now - currently on Heparin drip     Essential hypertension - reasonable inpatient control  - on Hydralazine, Imdur,  Coreg, Clonidine     Hyponatremia - mild, stable in the past 24 hours  - BMP in AM     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 Bilateral PNA, unknown pathogen - currently on Zithromax, day # 3/7    Obesity - Body mass index is 29.84 kg/(m^2).   DVT prophylaxis: on Heparin drip  Code Status: Partial code, no intubation but OK with everything else per ACLS protocol if needed  Family Communication: Patient and family at bedside  Disposition Plan: Home once consultants clear   Consultants:   Cardiology  Nephrology  PT/OT  Procedures:   None  Antimicrobials:   Zithromax 7/6 -->  Subjective: Reports feeling better.   Objective: Filed Vitals:   03/20/16 1703 03/20/16 1758 03/20/16 2057 03/21/16 0506  BP:  130/49 128/58 137/57  Pulse: 75 77 73 74  Temp: 98.3 F (36.8 C) 98.4 F (36.9 C) 98.7 F (37.1 C) 97.7 F (36.5 C)  TempSrc: Oral Oral Axillary Oral  Resp: 20  18 18   Height:      Weight:    88.089 kg (194 lb 3.2 oz)  SpO2: 95% 92% 94% 94%    Intake/Output Summary (Last 24 hours) at 03/21/16 1439 Last data filed at 03/21/16 0813  Gross per 24 hour  Intake 393.14 ml  Output    225 ml  Net 168.14 ml  Filed Weights   03/19/16 1841 03/21/16 0506  Weight: 89 kg (196 lb 3.4 oz) 88.089 kg (194 lb 3.2 oz)    Examination:  General exam: Appears calm and comfortable  Respiratory system: Respiratory effort normal. Diminished breath sounds at bases  Cardiovascular system: RRR. No JVD, rubs, gallops or clicks.Trace bilateral LE edema Gastrointestinal system: Abdomen is slightly distended, soft and nontender.  Central nervous system: Alert and oriented. No focal neurological deficits. Extremities: Symmetric 5 x 5 power. Skin: No rashes, lesions or ulcers Psychiatry: Judgement and insight appear  normal. Mood & affect appropriate.   Data Reviewed: I have personally reviewed following labs and imaging studies  CBC:  Recent Labs Lab 03/20/16 0807 03/21/16 0415  WBC 6.6 6.3  HGB 9.2* 8.6*  HCT 27.6* 26.7*  MCV 89.3 88.4  PLT 313 [id number]   Basic Metabolic Panel:  Recent Labs Lab 03/20/16 0308 03/20/16 2022 03/21/16 0949  NA 132* 130* 130*  K 4.6 4.0 4.0  CL 100* 101 99*  CO2 19* 20* 20*  GLUCOSE 358* 279* 339*  BUN 83* 90* 92*  CREATININE 5.30* 5.24* 5.36*  CALCIUM 9.0 8.7* 8.8*  PHOS  --   --  5.2*   Coagulation Profile:  Recent Labs Lab 03/19/16 2114  INR 1.30   Cardiac Enzymes:  Recent Labs Lab 03/19/16 2114 03/20/16 0308 03/20/16 0807  TROPONINI 0.94* 0.66* 0.60*   CBG:  Recent Labs Lab 03/20/16 1202 03/20/16 1705 03/20/16 2053 03/21/16 0730 03/21/16 1120  GLUCAP 389* 299* 273* 293* 298*   Urine analysis:    Component Value Date/Time   COLORURINE YELLOW 03/20/2016 1354   APPEARANCEUR CLOUDY* 03/20/2016 1354   LABSPEC 1.020 03/20/2016 1354   PHURINE 5.0 03/20/2016 1354   GLUCOSEU 250* 03/20/2016 1354   HGBUR NEGATIVE 03/20/2016 1354   BILIRUBINUR NEGATIVE 03/20/2016 1354   KETONESUR NEGATIVE 03/20/2016 1354   PROTEINUR 100* 03/20/2016 1354   NITRITE NEGATIVE 03/20/2016 1354   LEUKOCYTESUR MODERATE* 03/20/2016 1354   Radiology Studies: Dg Chest 2 View  03/20/2016  CLINICAL DATA:  Shortness of breath, weakness, acute respiratory failure and pneumonia, CHF, chronic renal insufficiency EXAM: CHEST  2 VIEW COMPARISON:  None in PACs FINDINGS: The lungs are adequately inflated. The interstitial markings are increased bilaterally. Confluent density is present in the lower lobes posteriorly. There are small bilateral pleural effusions layering posteriorly. The heart is normal in size. There are sternal wires from previous CABG. There is aortic atherosclerosis. The bony thorax exhibits no acute abnormality. IMPRESSION: Diffuse interstitial  prominence consistent with interstitial edema or pneumonia with confluent consolidation in both lower lobes with small pleural effusions. Post CABG changes. Aortic atherosclerosis. Electronically Signed   By: David  Martinique M.D.   On: 03/20/2016 07:31   US Renal  03/20/2016  CLINICAL DATA:  Acute kidney injury EXAM: RENAL / URINARY TRACT ULTRASOUND COMPLETE COMPARISON:  None. FINDINGS: Right Kidney: Length: 9.0 cm.  No mass or hydronephrosis. Left Kidney: Length: 10.8 cm.  No mass or hydronephrosis. Bladder: Within normal limits. IMPRESSION: Negative renal ultrasound. Electronically Signed   By: Julian Hy M.D.   On: 03/20/2016 21:30      Scheduled Meds: . acidophilus  1 capsule Oral Daily  . allopurinol  100 mg Oral Daily  . amLODipine  5 mg Oral Daily  . aspirin EC  81 mg Oral Daily  . azithromycin  250 mg Intravenous Q24H  . carvedilol  25 mg Oral BID WC  . cloNIDine  0.2 mg Transdermal Weekly  .  clopidogrel  75 mg Oral Daily  . ferrous sulfate  325 mg Oral Q breakfast  . hydrALAZINE  50 mg Oral Q8H  . insulin aspart  0-9 Units Subcutaneous TID WC  . insulin NPH Human  10 Units Subcutaneous BID AC & HS  . isosorbide mononitrate  60 mg Oral Daily  . loratadine  10 mg Oral Daily  . pantoprazole  40 mg Oral Q1200  . ranolazine  500 mg Oral BID  . rOPINIRole  4 mg Oral QHS  . rosuvastatin  10 mg Oral q1800  . sodium chloride flush  3 mL Intravenous Q12H  . sodium chloride flush  3 mL Intravenous Q12H  . tamsulosin  0.4 mg Oral Daily   Continuous Infusions: . [START ON 03/23/2016] sodium chloride    . furosemide (LASIX) infusion 15 mg/hr (03/21/16 0958)  . heparin 1,400 Units/hr (03/21/16 0842)     LOS: 2 days    Time spent: 20 minutes    Faye Ramsay, MD [facility] Pager [phone]  If 7PM-7AM, please contact night-coverage [email or web address] Password [facility] 03/21/2016, 2:39 PM

## 2016-03-21 NOTE — Progress Notes (Signed)
Admit: 03/19/2016 LOS: 2  21M AoCKD (vs CKD Progression), NSTEMI, AoC CHF Exacerbation  Subjective:  TTE yesterday, no LVEF reported, no diastolic dysfunction Minimal UOP with lasix gtt at 10 No labs yet this AM Feels better, dyspnea only with exertion No CP  Renal US w/o obstruction  07/07 0701 - 07/08 0700 In: 148.3 [I.V.:148.3] Out: 255 [Urine:255]  Filed Weights   03/19/16 1841 03/21/16 0506  Weight: 89 kg (196 lb 3.4 oz) 88.089 kg (194 lb 3.2 oz)    Scheduled Meds: . acidophilus  1 capsule Oral Daily  . allopurinol  100 mg Oral Daily  . amLODipine  5 mg Oral Daily  . aspirin EC  81 mg Oral Daily  . azithromycin  250 mg Intravenous Q24H  . carvedilol  25 mg Oral BID WC  . cloNIDine  0.2 mg Transdermal Weekly  . clopidogrel  75 mg Oral Daily  . ferrous sulfate  325 mg Oral Q breakfast  . hydrALAZINE  50 mg Oral Q8H  . insulin aspart  0-9 Units Subcutaneous TID WC  . insulin NPH Human  10 Units Subcutaneous BID AC & HS  . isosorbide mononitrate  60 mg Oral Daily  . loratadine  10 mg Oral Daily  . pantoprazole  40 mg Oral Q1200  . ranolazine  500 mg Oral BID  . rOPINIRole  4 mg Oral QHS  . rosuvastatin  10 mg Oral q1800  . sodium chloride flush  3 mL Intravenous Q12H  . sodium chloride flush  3 mL Intravenous Q12H  . tamsulosin  0.4 mg Oral Daily   Continuous Infusions: . [START ON 03/23/2016] sodium chloride    . furosemide (LASIX) infusion 10 mg/hr (03/21/16 0122)  . heparin 1,400 Units/hr (03/21/16 0842)   PRN Meds:.sodium chloride, acetaminophen **OR** acetaminophen, albuterol, hydrALAZINE, nitroGLYCERIN, ondansetron **OR** ondansetron (ZOFRAN) IV, oxyCODONE, sodium chloride flush  Current Labs: reviewed AM labs pending   Physical Exam:  Blood pressure 137/57, pulse 74, temperature 97.7 F (36.5 C), temperature source Oral, resp. rate 18, height 5\' 8"  (1.727 m), weight 88.089 kg (194 lb 3.2 oz), SpO2 94 %. IRIR, no rub. Nl s1s2 Diminished in bases Trace  LEE No rashes/lesions NAD, nl WOB No c/c/e  A 1. AoCKD4 1. Looksl ike has had progressive inc in [facility] over past months, had f/u appt at [facility] in near future 2. Suspect current issues related to CHF/NSTEMI related cardiorenal 3. Obstruction excluded by renal US 7/7 4. Nearing dialysis but not absolute indication currently 2. NSTEMI 3. ?systolic CHF exacerbation -- +Pleural effusions and edema 4. CAP on Azithro, defervesced 5. HTN 6. COPD  Plan 1. Inc lasix gtt see if responds 2. BID BMP 3. WOuld like things to settle out before LHC unless strong indication (CP free now, Trop elevated but stable) 4. Daily weights, Daily Renal Panel, Strict I/Os, Avoid nephrotoxins (NSAIDs, judicious IV Contrast)   Pearson Grippe MD 03/21/2016, 9:38 AM   Recent Labs Lab 03/20/16 0308 03/20/16 2022  NA 132* 130*  K 4.6 4.0  CL 100* 101  CO2 19* 20*  GLUCOSE 358* 279*  BUN 83* 90*  CREATININE 5.30* 5.24*  CALCIUM 9.0 8.7*    Recent Labs Lab 03/20/16 0807 03/21/16 0415  WBC 6.6 6.3  HGB 9.2* 8.6*  HCT 27.6* 26.7*  MCV 89.3 88.4  PLT 313 310

## 2016-03-22 DIAGNOSIS — I25119 Atherosclerotic heart disease of native coronary artery with unspecified angina pectoris: Secondary | ICD-10-CM | POA: Diagnosis not present

## 2016-03-22 DIAGNOSIS — N184 Chronic kidney disease, stage 4 (severe): Secondary | ICD-10-CM | POA: Diagnosis not present

## 2016-03-22 DIAGNOSIS — J449 Chronic obstructive pulmonary disease, unspecified: Secondary | ICD-10-CM | POA: Diagnosis not present

## 2016-03-22 DIAGNOSIS — I482 Chronic atrial fibrillation: Secondary | ICD-10-CM | POA: Diagnosis not present

## 2016-03-22 DIAGNOSIS — I480 Paroxysmal atrial fibrillation: Secondary | ICD-10-CM | POA: Diagnosis not present

## 2016-03-22 DIAGNOSIS — I214 Non-ST elevation (NSTEMI) myocardial infarction: Secondary | ICD-10-CM | POA: Diagnosis not present

## 2016-03-22 DIAGNOSIS — J9601 Acute respiratory failure with hypoxia: Secondary | ICD-10-CM | POA: Diagnosis not present

## 2016-03-22 DIAGNOSIS — N179 Acute kidney failure, unspecified: Secondary | ICD-10-CM | POA: Diagnosis not present

## 2016-03-22 DIAGNOSIS — I129 Hypertensive chronic kidney disease with stage 1 through stage 4 chronic kidney disease, or unspecified chronic kidney disease: Secondary | ICD-10-CM | POA: Diagnosis not present

## 2016-03-22 LAB — GLUCOSE, CAPILLARY
GLUCOSE-CAPILLARY: 253 mg/dL — AB (ref 65–99)
GLUCOSE-CAPILLARY: 217 mg/dL — AB (ref 65–99)
Glucose-Capillary: 201 mg/dL — ABNORMAL HIGH (ref 65–99)
Glucose-Capillary: 193 mg/dL — ABNORMAL HIGH (ref 65–99)

## 2016-03-22 LAB — BASIC METABOLIC PANEL
ANION GAP: 11 (ref 5–15)
BUN: 92 mg/dL — ABNORMAL HIGH (ref 6–20)
CHLORIDE: 99 mmol/L — AB (ref 101–111)
CO2: 20 mmol/L — AB (ref 22–32)
CREATININE: 5.36 mg/dL — AB (ref 0.61–1.24)
Calcium: 8.7 mg/dL — ABNORMAL LOW (ref 8.9–10.3)
GFR calc non Af Amer: 9 mL/min — ABNORMAL LOW (ref >60)
GFR, EST AFRICAN AMERICAN: 11 mL/min — AB (ref >60)
Glucose, Bld: 274 mg/dL — ABNORMAL HIGH (ref 65–99)
POTASSIUM: 3.9 mmol/L (ref 3.5–5.1)
SODIUM: 130 mmol/L — AB (ref 135–145)

## 2016-03-22 LAB — HEPARIN LEVEL (UNFRACTIONATED): HEPARIN UNFRACTIONATED: 0.82 [IU]/mL — AB (ref 0.30–0.70)

## 2016-03-22 LAB — CBC
HEMATOCRIT: 25.8 % — AB (ref 39.0–52.0)
HEMOGLOBIN: 8.4 g/dL — AB (ref 13.0–17.0)
MCH: 29.1 pg (ref 26.0–34.0)
MCHC: 32.6 g/dL (ref 30.0–36.0)
MCV: 89.3 fL (ref 78.0–100.0)
Platelets: 325 10*3/uL (ref 150–400)
RBC: 2.89 MIL/uL — AB (ref 4.22–5.81)
RDW: 16.4 % — ABNORMAL HIGH (ref 11.5–15.5)
WBC: 7 10*3/uL (ref 4.0–10.5)

## 2016-03-22 LAB — APTT
APTT: 95 s — AB (ref 24–37)
APTT: 93 s — AB (ref 24–37)

## 2016-03-22 MED ORDER — SODIUM CHLORIDE 0.9 % WEIGHT BASED INFUSION: 1.0000 mL/kg/h | INTRAVENOUS | Status: DC

## 2016-03-22 MED ORDER — SODIUM CHLORIDE 0.9 % IV SOLN: 250.0000 mL | INTRAVENOUS | Status: DC | PRN

## 2016-03-22 MED ORDER — SODIUM CHLORIDE 0.9 % WEIGHT BASED INFUSION
3.0000 mL/kg/h | INTRAVENOUS | Status: AC
  Administered 2016-03-23: 3 mL/kg/h via INTRAVENOUS

## 2016-03-22 MED ORDER — SODIUM CHLORIDE 0.9% FLUSH: 3.0000 mL | INTRAVENOUS | Status: DC | PRN

## 2016-03-22 MED ORDER — SODIUM CHLORIDE 0.9% FLUSH
3.0000 mL | Freq: Two times a day (BID) | INTRAVENOUS | Status: DC
  Administered 2016-03-22 – 2016-03-23 (×2): 3 mL via INTRAVENOUS

## 2016-03-22 MED ORDER — ASPIRIN 81 MG PO CHEW
81.0000 mg | CHEWABLE_TABLET | ORAL | Status: AC
  Administered 2016-03-23: 81 mg via ORAL
  Filled 2016-03-22: qty 1

## 2016-03-22 NOTE — Progress Notes (Signed)
ANTICOAGULATION CONSULT NOTE - Follow Up Consult  Pharmacy Consult for Heparin (apixaban on hold) Indication: chest pain/ACS, afib  Allergies  Allergen Reactions  . Statins Other (See Comments)    Pt has tried 4-5 diff kinds. 03/20/2016 Pt reports no reaction or allergy, but has tried different types of statins to figure out which works best for him.     Patient Measurements: Height: 5\' 8"  (172.7 cm) Weight: 193 lb 14.4 oz (87.952 kg) IBW/kg (Calculated) : 68.4  Vital Signs: Temp: 97.9 F (36.6 C) (07/09 0500) BP: 129/55 mmHg (07/09 0612) Pulse Rate: 65 (07/09 0500)  Labs:  Recent Labs  03/19/16 2114 03/20/16 0308 03/20/16 0309  03/20/16 [id number]  03/21/16 0415 03/21/16 0949 03/21/16 1153 03/21/16 1603 03/21/16 2337 03/22/16 0446  HGB  --   --   --   < > 9.2*  --  8.6*  --   --   --   --  8.4*  HCT  --   --   --   --  27.6*  --  26.7*  --   --   --   --  25.8*  PLT  --   --   --   --  313  --  310  --   --   --   --  325  APTT 37  --   --   < >  --   < > 89*  --  104*  --  93* 95*  LABPROT 16.3*  --   --   --   --   --   --   --   --   --   --   --   INR 1.30  --   --   --   --   --   --   --   --   --   --   --   HEPARINUNFRC  --   --  0.84*  --   --   --  0.92*  --   --   --   --  0.82*  CREATININE  --  5.30*  --   --   --   < >  --  5.36*  --  5.45*  --  5.36*  TROPONINI 0.94* 0.66*  --   --  0.60*  --   --   --   --   --   --   --   < > = values in this interval not displayed.  Estimated Creatinine Clearance: 12.6 mL/min (by C-G formula based on Cr of 5.36).  Assessment: Tx from [place], NSTEMI in setting of SOB/renal failure. On Eliquis 2.5mg  BID PTA. Last dose 7/2 AM. Started on heparin at [facility] for NSTEMI. To continue heparin for now. HL this am is supratherapeutic at 0.82, aPTT is therapeutic at 95. Levels are not correlating yet, but are closer. Hgb low but stable at 8.4, plts wnl. No s/s bleeding.   Goal of Therapy:  Heparin level 0.3-0.7 units/ml aPTT  66-102 seconds Monitor platelets by anticoagulation protocol: Yes   Plan:  Continue heparin gtt at 1,350 units/hr Monitor daily HL / aPTT, CBC, s/s of bleed  Argie Ramming, PharmD Pager: [phone]

## 2016-03-22 NOTE — Progress Notes (Signed)
Subjective:   75 y.o. male with medical history significant of hypertension, hyperlipidemia, diabetes mellitus, COPD, GERD, gout, depression, anxiety, CAD, s/p of CABG, congestive heart failure, CKD-IV, BPH, anemia, remote history of alcoholism, atrial fibrillation on Eliquis, restless leg syndrome, who presents with a shortness of breath  The patient has a baseline creatinine of 5. He is scheduled tentatively for cardiac catheterization on Monday. He'll need to be set up for dialysis in the next day or so.  He has been seen by nephrology and they would like to determine whether or not he'll respond to Lasix before has a heart catheterization   Objective:  Temp:  [97.8 F (36.6 C)-98.1 F (36.7 C)] 97.9 F (36.6 C) (07/09 0500) Pulse Rate:  [65-74] 65 (07/09 0500) Resp:  [20-21] 20 (07/09 0500) BP: (119-133)/(45-93) 129/55 mmHg (07/09 0612) SpO2:  [95 %-96 %] 96 % (07/09 0500) Weight:  [193 lb 14.4 oz (87.952 kg)] 193 lb 14.4 oz (87.952 kg) (07/09 0500) Weight change: -4.8 oz (-0.136 kg)  Intake/Output from previous day: 07/08 0701 - 07/09 0700 In: 1278.5 [P.O.:840; I.V.:313.5; IV Piggyback:125] Out: 750 [Urine:750]  Intake/Output from this shift: Total I/O In: -  Out: 200 [Urine:200]  Physical Exam: General appearance: alert and no distress Neck: no adenopathy, no carotid bruit, no JVD, supple, symmetrical, trachea midline and thyroid not enlarged, symmetric, no tenderness/mass/nodules Lungs: clear to auscultation bilaterally Heart: regular rate and rhythm, S1, S2 normal, no murmur, click, rub or gallop Extremities: extremities normal, atraumatic, no cyanosis or edema  Lab Results: Results for orders placed or performed during the hospital encounter of 03/19/16 (from the past 48 hour(s))  Glucose, capillary     Status: Abnormal   Collection Time: 03/20/16 12:02 PM  Result Value Ref Range   Glucose-Capillary 389 (H) 65 - 99 mg/dL  APTT     Status: Abnormal   Collection Time: 03/20/16  1:27 PM  Result Value Ref Range   aPTT 66 (H) 24 - 37 seconds    Comment:        IF BASELINE aPTT IS ELEVATED, SUGGEST PATIENT RISK ASSESSMENT BE USED TO DETERMINE APPROPRIATE ANTICOAGULANT THERAPY.   Urinalysis, Routine w reflex microscopic (not at [facility])     Status: Abnormal   Collection Time: 03/20/16  1:54 PM  Result Value Ref Range   Color, Urine YELLOW YELLOW   APPearance CLOUDY (A) CLEAR   Specific Gravity, Urine 1.020 1.005 - 1.030   pH 5.0 5.0 - 8.0   Glucose, UA 250 (A) NEGATIVE mg/dL   Hgb urine dipstick NEGATIVE NEGATIVE   Bilirubin Urine NEGATIVE NEGATIVE   Ketones, ur NEGATIVE NEGATIVE mg/dL   Protein, ur 100 (A) NEGATIVE mg/dL   Nitrite NEGATIVE NEGATIVE   Leukocytes, UA MODERATE (A) NEGATIVE  Urine microscopic-add on     Status: Abnormal   Collection Time: 03/20/16  1:54 PM  Result Value Ref Range   Squamous Epithelial / LPF 6-30 (A) NONE SEEN   WBC, UA 6-30 0 - 5 WBC/hpf   RBC / HPF 0-5 0 - 5 RBC/hpf   Bacteria, UA FEW (A) NONE SEEN   Casts GRANULAR CAST (A) NEGATIVE    Comment: WAXY CAST  Glucose, capillary     Status: Abnormal   Collection Time: 03/20/16  5:05 PM  Result Value Ref Range   Glucose-Capillary 299 (H) 65 - 99 mg/dL  MRSA PCR Screening     Status: None   Collection Time: 03/20/16  6:15 PM  Result  Value Ref Range   MRSA by PCR NEGATIVE NEGATIVE    Comment:        The GeneXpert MRSA Assay (FDA approved for NASAL specimens only), is one component of a comprehensive MRSA colonization surveillance program. It is not intended to diagnose MRSA infection nor to guide or monitor treatment for MRSA infections.   Basic metabolic panel     Status: Abnormal   Collection Time: 03/20/16  8:22 PM  Result Value Ref Range   Sodium 130 (L) 135 - 145 mmol/L   Potassium 4.0 3.5 - 5.1 mmol/L   Chloride 101 101 - 111 mmol/L   CO2 20 (L) 22 - 32 mmol/L   Glucose, Bld 279 (H) 65 - 99 mg/dL   BUN 90 (H) 6 - 20 mg/dL    Creatinine, Ser 5.24 (H) 0.61 - 1.24 mg/dL   Calcium 8.7 (L) 8.9 - 10.3 mg/dL   GFR calc non Af Amer 10 (L) >60 mL/min   GFR calc Af Amer 11 (L) >60 mL/min    Comment: (NOTE) The eGFR has been calculated using the CKD EPI equation. This calculation has not been validated in all clinical situations. eGFR's persistently <60 mL/min signify possible Chronic Kidney Disease.    Anion gap 9 5 - 15  Glucose, capillary     Status: Abnormal   Collection Time: 03/20/16  8:53 PM  Result Value Ref Range   Glucose-Capillary 273 (H) 65 - 99 mg/dL  Heparin level (unfractionated)     Status: Abnormal   Collection Time: 03/21/16  4:15 AM  Result Value Ref Range   Heparin Unfractionated 0.92 (H) 0.30 - 0.70 IU/mL    Comment:        IF HEPARIN RESULTS ARE BELOW EXPECTED VALUES, AND PATIENT DOSAGE HAS BEEN CONFIRMED, SUGGEST FOLLOW UP TESTING OF ANTITHROMBIN III LEVELS.   CBC     Status: Abnormal   Collection Time: 03/21/16  4:15 AM  Result Value Ref Range   WBC 6.3 4.0 - 10.5 K/uL   RBC 3.02 (L) 4.22 - 5.81 MIL/uL   Hemoglobin 8.6 (L) 13.0 - 17.0 g/dL   HCT 26.7 (L) 39.0 - 52.0 %   MCV 88.4 78.0 - 100.0 fL   MCH 28.5 26.0 - 34.0 pg   MCHC 32.2 30.0 - 36.0 g/dL   RDW 16.5 (H) 11.5 - 15.5 %   Platelets 310 150 - 400 K/uL  APTT     Status: Abnormal   Collection Time: 03/21/16  4:15 AM  Result Value Ref Range   aPTT 89 (H) 24 - 37 seconds    Comment:        IF BASELINE aPTT IS ELEVATED, SUGGEST PATIENT RISK ASSESSMENT BE USED TO DETERMINE APPROPRIATE ANTICOAGULANT THERAPY.   Glucose, capillary     Status: Abnormal   Collection Time: 03/21/16  7:30 AM  Result Value Ref Range   Glucose-Capillary 293 (H) 65 - 99 mg/dL  Renal function panel     Status: Abnormal   Collection Time: 03/21/16  9:49 AM  Result Value Ref Range   Sodium 130 (L) 135 - 145 mmol/L   Potassium 4.0 3.5 - 5.1 mmol/L   Chloride 99 (L) 101 - 111 mmol/L   CO2 20 (L) 22 - 32 mmol/L   Glucose, Bld 339 (H) 65 - 99 mg/dL     BUN 92 (H) 6 - 20 mg/dL   Creatinine, Ser 5.36 (H) 0.61 - 1.24 mg/dL   Calcium 8.8 (L) 8.9 - 10.3 mg/dL  Phosphorus 5.2 (H) 2.5 - 4.6 mg/dL   Albumin 2.5 (L) 3.5 - 5.0 g/dL   GFR calc non Af Amer 9 (L) >60 mL/min   GFR calc Af Amer 11 (L) >60 mL/min    Comment: (NOTE) The eGFR has been calculated using the CKD EPI equation. This calculation has not been validated in all clinical situations. eGFR's persistently <60 mL/min signify possible Chronic Kidney Disease.    Anion gap 11 5 - 15  Glucose, capillary     Status: Abnormal   Collection Time: 03/21/16 11:20 AM  Result Value Ref Range   Glucose-Capillary 298 (H) 65 - 99 mg/dL  APTT     Status: Abnormal   Collection Time: 03/21/16 11:53 AM  Result Value Ref Range   aPTT 104 (H) 24 - 37 seconds    Comment:        IF BASELINE aPTT IS ELEVATED, SUGGEST PATIENT RISK ASSESSMENT BE USED TO DETERMINE APPROPRIATE ANTICOAGULANT THERAPY.   Basic metabolic panel     Status: Abnormal   Collection Time: 03/21/16  4:03 PM  Result Value Ref Range   Sodium 131 (L) 135 - 145 mmol/L   Potassium 4.0 3.5 - 5.1 mmol/L   Chloride 100 (L) 101 - 111 mmol/L   CO2 20 (L) 22 - 32 mmol/L   Glucose, Bld 306 (H) 65 - 99 mg/dL   BUN 93 (H) 6 - 20 mg/dL   Creatinine, Ser 5.45 (H) 0.61 - 1.24 mg/dL   Calcium 8.7 (L) 8.9 - 10.3 mg/dL   GFR calc non Af Amer 9 (L) >60 mL/min   GFR calc Af Amer 11 (L) >60 mL/min    Comment: (NOTE) The eGFR has been calculated using the CKD EPI equation. This calculation has not been validated in all clinical situations. eGFR's persistently <60 mL/min signify possible Chronic Kidney Disease.    Anion gap 11 5 - 15  Glucose, capillary     Status: Abnormal   Collection Time: 03/21/16  4:59 PM  Result Value Ref Range   Glucose-Capillary 239 (H) 65 - 99 mg/dL  Glucose, capillary     Status: Abnormal   Collection Time: 03/21/16  8:50 PM  Result Value Ref Range   Glucose-Capillary 244 (H) 65 - 99 mg/dL  Glucose,  capillary     Status: Abnormal   Collection Time: 03/21/16 10:13 PM  Result Value Ref Range   Glucose-Capillary 300 (H) 65 - 99 mg/dL  APTT     Status: Abnormal   Collection Time: 03/21/16 11:37 PM  Result Value Ref Range   aPTT 93 (H) 24 - 37 seconds    Comment:        IF BASELINE aPTT IS ELEVATED, SUGGEST PATIENT RISK ASSESSMENT BE USED TO DETERMINE APPROPRIATE ANTICOAGULANT THERAPY.   Heparin level (unfractionated)     Status: Abnormal   Collection Time: 03/22/16  4:46 AM  Result Value Ref Range   Heparin Unfractionated 0.82 (H) 0.30 - 0.70 IU/mL    Comment:        IF HEPARIN RESULTS ARE BELOW EXPECTED VALUES, AND PATIENT DOSAGE HAS BEEN CONFIRMED, SUGGEST FOLLOW UP TESTING OF ANTITHROMBIN III LEVELS.   CBC     Status: Abnormal   Collection Time: 03/22/16  4:46 AM  Result Value Ref Range   WBC 7.0 4.0 - 10.5 K/uL   RBC 2.89 (L) 4.22 - 5.81 MIL/uL   Hemoglobin 8.4 (L) 13.0 - 17.0 g/dL   HCT 25.8 (L) 39.0 - 52.0 %  MCV 89.3 78.0 - 100.0 fL   MCH 29.1 26.0 - 34.0 pg   MCHC 32.6 30.0 - 36.0 g/dL   RDW 16.4 (H) 11.5 - 15.5 %   Platelets 325 150 - 400 K/uL  Basic metabolic panel     Status: Abnormal   Collection Time: 03/22/16  4:46 AM  Result Value Ref Range   Sodium 130 (L) 135 - 145 mmol/L   Potassium 3.9 3.5 - 5.1 mmol/L   Chloride 99 (L) 101 - 111 mmol/L   CO2 20 (L) 22 - 32 mmol/L   Glucose, Bld 274 (H) 65 - 99 mg/dL   BUN 92 (H) 6 - 20 mg/dL   Creatinine, Ser 5.36 (H) 0.61 - 1.24 mg/dL   Calcium 8.7 (L) 8.9 - 10.3 mg/dL   GFR calc non Af Amer 9 (L) >60 mL/min   GFR calc Af Amer 11 (L) >60 mL/min    Comment: (NOTE) The eGFR has been calculated using the CKD EPI equation. This calculation has not been validated in all clinical situations. eGFR's persistently <60 mL/min signify possible Chronic Kidney Disease.    Anion gap 11 5 - 15  APTT     Status: Abnormal   Collection Time: 03/22/16  4:46 AM  Result Value Ref Range   aPTT 95 (H) 24 - 37 seconds     Comment:        IF BASELINE aPTT IS ELEVATED, SUGGEST PATIENT RISK ASSESSMENT BE USED TO DETERMINE APPROPRIATE ANTICOAGULANT THERAPY.   Glucose, capillary     Status: Abnormal   Collection Time: 03/22/16  7:33 AM  Result Value Ref Range   Glucose-Capillary 253 (H) 65 - 99 mg/dL    Imaging: Imaging results have been reviewed Diffuse interstitial edema  Tele- personally reviewed.  NSR  Assessment/Plan:   1. CAD :   Has known coronary artery disease and has had a coronary artery bypass grafting. He now presents with a non-ST segment elevation myocardial infarction. Needs to have repeat catheterization but with his chronic kidney disease stage IV, he needs to have dialysis issues worked out before we do heart Catheterization. Fortunately he is pain-free and breathing comfortably.  2. Chronic kidney disease. His creatinine is above 5. Nephrology is following.  3. COPD:  4. Hyperlipidemia:  On Crestor 10 mg a day   5. Paroxysmal atrial fib: . He remains in normal sinus rhythm.   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

## 2016-03-22 NOTE — Progress Notes (Signed)
Report received via [place] RN in patient's room using [employer], reviewed orders, labs, VS, meds and patient's general condition, assumed care of patient.

## 2016-03-22 NOTE — Progress Notes (Signed)
Patient ID: Kyle [name], male   DOB: 12-Dec-1940, 75 y.o.   MRN: [id number]   PROGRESS NOTE    Kyle [name]  [id number] DOB: 04/03/1941 DOA: 03/19/2016  PCP: Kyle Neighbors, MD   Brief Narrative:  75 y.o. male with hypertension, hyperlipidemia, diabetes mellitus, COPD, GERD, gout, depression, anxiety, CAD, s/p of CABG, congestive heart failure, CKD-IV, BPH, anemia, remote history of alcoholism, atrial fibrillation on Eliquis, presented from [facility] for further evaluation of progressive dyspnea with exertion and occasionally present at rest. Pt presented to [facility] 7/3 and at that time had fevers up to 101.85F and oxygen saturations in 70's. He was treated with IV Rocephin, Zithromax, Zosyn, IV lasix. His troponins were elevated and continued to increase 2.8>>5.3>> 6.6>>6.0, consistent with NSTEMI. Cardiology was consulted, patient was treated with heparin drip. Of note, pt has CKD-IV, and was seen by renal in the past, HD recommended even 3 years ago, but patient refused dialysis in the past.   Assessment & Plan:   Principal Problem:   Acute respiratory failure with hypoxia ([place]) - secondary to acute systolic CHF, ? CAP, AoCKD4 - currently on lasix drip, managed by nephrology team  - pt currently off oxygen, comfortable, maintaining oxygen saturation at target range   Active Problems:   NSTEMI, acute systolic CHF - cardiology will plan R/L heart cath Monday  - appreciate cardio team following     Acute on chronic kidney disease stage IV - with progressive increase in Cr in the past few months  - now Cr up likely from cardiorenal etiology, NSTEMI, Cr slightly better this AM - nephrology team following, appreciate assistance  - renal US unremarkable  - strict I/O, daily weights     Atrial fibrillation (Kyle [name]), chronic, CHADS2 vasc score 4-5 - on Eliquis at home, holding for now - continue Heparin drip     Essential hypertension - reasonable inpatient control  -  on Hydralazine, Imdur, Coreg, Clonidine     Hyponatremia - mild, stable in the past 24 hours  - BMP in AM     DM due to underlying condition with diabetic chronic kidney disease (Kyle [name]) - continue insulin NPH as well as SSI    Anemia of chronic disease, kidney disease - no sings of bleeding - CBC In AM    ? Bilateral PNA, unknown pathogen - currently on Zithromax, day # 4/7    Obesity - Body mass index is 29.84 kg/(m^2).   DVT prophylaxis: on Heparin drip  Code Status: Partial code, no intubation but OK with everything else per ACLS protocol if needed  Family Communication: Patient and family at bedside  Disposition Plan: Home once consultants clear   Consultants:   Cardiology  Nephrology  PT/OT  Procedures:   None  Antimicrobials:   Zithromax 7/6 -->  Subjective: Reports feeling better.   Objective: Filed Vitals:   03/21/16 2100 03/21/16 2151 03/22/16 0500 03/22/16 0612  BP: 121/45 119/50 133/74 129/55  Pulse: 74  65   Temp: 98.1 F (36.7 C)  97.9 F (36.6 C)   TempSrc:      Resp: 21  20   Height:      Weight:   87.952 kg (193 lb 14.4 oz)   SpO2: 96%  96%     Intake/Output Summary (Last 24 hours) at 03/22/16 0725 Last data filed at 03/22/16 0600  Gross per 24 hour  Intake 1278.5 ml  Output    750 ml  Net  528.5 ml  Filed Weights   03/19/16 1841 03/21/16 0506 03/22/16 0500  Weight: 89 kg (196 lb 3.4 oz) 88.089 kg (194 lb 3.2 oz) 87.952 kg (193 lb 14.4 oz)    Examination:  General exam: Appears calm and comfortable  Respiratory system: Respiratory effort normal. Diminished breath sounds at bases  Cardiovascular system: RRR. No JVD, rubs, gallops or clicks.Trace bilateral LE edema Gastrointestinal system: Abdomen is slightly distended, soft and nontender.  Central nervous system: Alert and oriented. No focal neurological deficits. Extremities: Symmetric 5 x 5 power. Skin: No rashes, lesions or ulcers Psychiatry: Judgement and insight appear  normal. Mood & affect appropriate.   Data Reviewed: I have personally reviewed following labs and imaging studies  CBC:  Recent Labs Lab 03/20/16 0807 03/21/16 0415 03/22/16 0446  WBC 6.6 6.3 7.0  HGB 9.2* 8.6* 8.4*  HCT 27.6* 26.7* 25.8*  MCV 89.3 88.4 89.3  PLT 313 310 [id number]   Basic Metabolic Panel:  Recent Labs Lab 03/20/16 0308 03/20/16 2022 03/21/16 0949 03/21/16 1603 03/22/16 0446  NA 132* 130* 130* 131* 130*  K 4.6 4.0 4.0 4.0 3.9  CL 100* 101 99* 100* 99*  CO2 19* 20* 20* 20* 20*  GLUCOSE 358* 279* 339* 306* 274*  BUN 83* 90* 92* 93* 92*  CREATININE 5.30* 5.24* 5.36* 5.45* 5.36*  CALCIUM 9.0 8.7* 8.8* 8.7* 8.7*  PHOS  --   --  5.2*  --   --    Coagulation Profile:  Recent Labs Lab 03/19/16 2114  INR 1.30   Cardiac Enzymes:  Recent Labs Lab 03/19/16 2114 03/20/16 0308 03/20/16 0807  TROPONINI 0.94* 0.66* 0.60*   CBG:  Recent Labs Lab 03/21/16 0730 03/21/16 1120 03/21/16 1659 03/21/16 2050 03/21/16 2213  GLUCAP 293* 298* 239* 244* 300*   Urine analysis:    Component Value Date/Time   COLORURINE YELLOW 03/20/2016 1354   APPEARANCEUR CLOUDY* 03/20/2016 1354   LABSPEC 1.020 03/20/2016 1354   PHURINE 5.0 03/20/2016 1354   GLUCOSEU 250* 03/20/2016 1354   HGBUR NEGATIVE 03/20/2016 1354   BILIRUBINUR NEGATIVE 03/20/2016 1354   KETONESUR NEGATIVE 03/20/2016 1354   PROTEINUR 100* 03/20/2016 1354   NITRITE NEGATIVE 03/20/2016 1354   LEUKOCYTESUR MODERATE* 03/20/2016 1354   Radiology Studies: US Renal  03/20/2016  CLINICAL DATA:  Acute kidney injury EXAM: RENAL / URINARY TRACT ULTRASOUND COMPLETE COMPARISON:  None. FINDINGS: Right Kidney: Length: 9.0 cm.  No mass or hydronephrosis. Left Kidney: Length: 10.8 cm.  No mass or hydronephrosis. Bladder: Within normal limits. IMPRESSION: Negative renal ultrasound. Electronically Signed   By: Julian Hy M.D.   On: 03/20/2016 21:30      Scheduled Meds: . acidophilus  1 capsule Oral Daily  .  allopurinol  100 mg Oral Daily  . amLODipine  5 mg Oral Daily  . aspirin EC  81 mg Oral Daily  . azithromycin  250 mg Intravenous Q24H  . carvedilol  25 mg Oral BID WC  . cloNIDine  0.2 mg Transdermal Weekly  . clopidogrel  75 mg Oral Daily  . ferrous sulfate  325 mg Oral Q breakfast  . hydrALAZINE  50 mg Oral Q8H  . insulin aspart  0-9 Units Subcutaneous TID WC  . insulin NPH Human  20 Units Subcutaneous BID AC & HS  . isosorbide mononitrate  60 mg Oral Daily  . loratadine  10 mg Oral Daily  . pantoprazole  40 mg Oral Q1200  . ranolazine  500 mg Oral BID  . rOPINIRole  4  mg Oral QHS  . rosuvastatin  10 mg Oral q1800  . sodium chloride flush  3 mL Intravenous Q12H  . sodium chloride flush  3 mL Intravenous Q12H  . tamsulosin  0.4 mg Oral Daily   Continuous Infusions: . [START ON 03/23/2016] sodium chloride    . furosemide (LASIX) infusion 15 mg/hr (03/21/16 2151)  . heparin 1,350 Units/hr (03/22/16 0328)     LOS: 3 days    Time spent: 20 minutes    Faye Ramsay, MD [facility] Pager [phone]  If 7PM-7AM, please contact night-coverage [email or web address] Password [email or web address] 03/22/2016, 7:25 AM

## 2016-03-22 NOTE — Progress Notes (Signed)
Admit: 03/19/2016 LOS: 3  78M AoCKD (vs CKD Progression), NSTEMI, AoC CHF Exacerbation  Subjective:  In good spirits More UOP than day before, or more accurate On RA, breathing well Labs/GFR stable on lasix gtt Tentative for RHC/LHC tomorrow  07/08 0701 - 07/09 0700 In: 1278.5 [P.O.:840; I.V.:313.5; IV Piggyback:125] Out: 750 [Urine:750]  Filed Weights   03/19/16 1841 03/21/16 0506 03/22/16 0500  Weight: 89 kg (196 lb 3.4 oz) 88.089 kg (194 lb 3.2 oz) 87.952 kg (193 lb 14.4 oz)    Scheduled Meds: . acidophilus  1 capsule Oral Daily  . allopurinol  100 mg Oral Daily  . amLODipine  5 mg Oral Daily  . aspirin EC  81 mg Oral Daily  . azithromycin  250 mg Intravenous Q24H  . carvedilol  25 mg Oral BID WC  . cloNIDine  0.2 mg Transdermal Weekly  . clopidogrel  75 mg Oral Daily  . ferrous sulfate  325 mg Oral Q breakfast  . hydrALAZINE  50 mg Oral Q8H  . insulin aspart  0-9 Units Subcutaneous TID WC  . insulin NPH Human  20 Units Subcutaneous BID AC & HS  . isosorbide mononitrate  60 mg Oral Daily  . loratadine  10 mg Oral Daily  . pantoprazole  40 mg Oral Q1200  . ranolazine  500 mg Oral BID  . rOPINIRole  4 mg Oral QHS  . rosuvastatin  10 mg Oral q1800  . sodium chloride flush  3 mL Intravenous Q12H  . sodium chloride flush  3 mL Intravenous Q12H  . tamsulosin  0.4 mg Oral Daily   Continuous Infusions: . [START ON 03/23/2016] sodium chloride    . furosemide (LASIX) infusion 15 mg/hr (03/21/16 2151)  . heparin 1,350 Units/hr (03/22/16 0328)   PRN Meds:.sodium chloride, acetaminophen **OR** acetaminophen, albuterol, hydrALAZINE, nitroGLYCERIN, ondansetron **OR** ondansetron (ZOFRAN) IV, oxyCODONE, sodium chloride flush  Current Labs: reviewed AM labs pending   Physical Exam:  Blood pressure 129/55, pulse 65, temperature 97.9 F (36.6 C), temperature source Oral, resp. rate 20, height 5\' 8"  (1.727 m), weight 87.952 kg (193 lb 14.4 oz), SpO2 96 %. IRIR, no rub. Nl  s1s2 Diminished in bases Trace LEE No rashes/lesions NAD, nl WOB No c/c/e  A 1. AoCKD4 1. Looksl ike has had progressive inc in [facility] over past months, had f/u appt at [facility] in near future 2. Suspect current issues related to CHF/NSTEMI related cardiorenal 3. Obstruction excluded by renal US 7/7 4. Nearing dialysis but not absolute indication currently 2. NSTEMI 3. ?systolic CHF exacerbation -- +Pleural effusions and edema 4. CAP on Azithro, defervesced 5. HTN 6. COPD  Plan 1. Seems to be somewhat responsive to lasix 2. No indication for HD 3. Have at length discussed risk of CIN and need for RRT in settting of LHC.  He understands and recognizes, wishing to move forward with catehterization.  Will follow renal function closely and if needs HD he is accepting 4. Stop lasix gtt at midnight 5. Daily weights, Daily Renal Panel, Strict I/Os, Avoid nephrotoxins (NSAIDs, judicious IV Contrast)   Pearson Grippe MD 03/22/2016, 9:18 AM   Recent Labs Lab 03/21/16 0949 03/21/16 1603 03/22/16 0446  NA 130* 131* 130*  K 4.0 4.0 3.9  CL 99* 100* 99*  CO2 20* 20* 20*  GLUCOSE 339* 306* 274*  BUN 92* 93* 92*  CREATININE 5.36* 5.45* 5.36*  CALCIUM 8.8* 8.7* 8.7*  PHOS 5.2*  --   --     Recent Labs  Lab 03/20/16 0807 03/21/16 0415 03/22/16 0446  WBC 6.6 6.3 7.0  HGB 9.2* 8.6* 8.4*  HCT 27.6* 26.7* 25.8*  MCV 89.3 88.4 89.3  PLT 313 310 325

## 2016-03-22 NOTE — Progress Notes (Signed)
[name] for Heparin Indication: chest pain/ACS  Allergies  Allergen Reactions  . Statins Other (See Comments)    Pt has tried 4-5 diff kinds. 03/20/2016 Pt reports no reaction or allergy, but has tried different types of statins to figure out which works best for him.     Patient Measurements: Height: 5\' 8"  (172.7 cm) Weight: 194 lb 3.2 oz (88.089 kg) IBW/kg (Calculated) : 68.4  Vital Signs: Temp: 98.1 F (36.7 C) (07/08 2100) Temp Source: Oral (07/08 1531) BP: 119/50 mmHg (07/08 2151) Pulse Rate: 74 (07/08 2100)  Labs:  Recent Labs  03/19/16 2114  03/20/16 0308 03/20/16 0309  03/20/16 [id number]  03/20/16 2022 03/21/16 0415 03/21/16 0949 03/21/16 1153 03/21/16 1603 03/21/16 2337  HGB  --   --   --   --   --  9.2*  --   --  8.6*  --   --   --   --   HCT  --   --   --   --   --  27.6*  --   --  26.7*  --   --   --   --   PLT  --   --   --   --   --  313  --   --  310  --   --   --   --   APTT 37  --   --   --   < >  --   < >  --  89*  --  104*  --  93*  LABPROT 16.3*  --   --   --   --   --   --   --   --   --   --   --   --   INR 1.30  --   --   --   --   --   --   --   --   --   --   --   --   HEPARINUNFRC  --   --   --  0.84*  --   --   --   --  0.92*  --   --   --   --   CREATININE  --   < > 5.30*  --   --   --   --  5.24*  --  5.36*  --  5.45*  --   TROPONINI 0.94*  --  0.66*  --   --  0.60*  --   --   --   --   --   --   --   < > = values in this interval not displayed.  Estimated Creatinine Clearance: 12.4 mL/min (by C-G formula based on Cr of 5.45).   Assessment:  75 y.o. male with NSTEMI, h/o Afib and Eliquis on hold, for heparin  Goal of Therapy:  Heparin level 0.3-0.7 units/ml aPTT 66-102 seconds Monitor platelets by anticoagulation protocol: Yes   Plan:  Continue Heparin at current rate  Phillis Knack, PharmD, BCPS

## 2016-03-23 DIAGNOSIS — I129 Hypertensive chronic kidney disease with stage 1 through stage 4 chronic kidney disease, or unspecified chronic kidney disease: Secondary | ICD-10-CM | POA: Diagnosis not present

## 2016-03-23 DIAGNOSIS — I481 Persistent atrial fibrillation: Secondary | ICD-10-CM | POA: Diagnosis not present

## 2016-03-23 DIAGNOSIS — E0822 Diabetes mellitus due to underlying condition with diabetic chronic kidney disease: Secondary | ICD-10-CM

## 2016-03-23 DIAGNOSIS — J9601 Acute respiratory failure with hypoxia: Secondary | ICD-10-CM | POA: Diagnosis not present

## 2016-03-23 DIAGNOSIS — I480 Paroxysmal atrial fibrillation: Secondary | ICD-10-CM | POA: Diagnosis not present

## 2016-03-23 DIAGNOSIS — I2510 Atherosclerotic heart disease of native coronary artery without angina pectoris: Secondary | ICD-10-CM | POA: Diagnosis not present

## 2016-03-23 DIAGNOSIS — Z794 Long term (current) use of insulin: Secondary | ICD-10-CM

## 2016-03-23 DIAGNOSIS — E785 Hyperlipidemia, unspecified: Secondary | ICD-10-CM

## 2016-03-23 DIAGNOSIS — J449 Chronic obstructive pulmonary disease, unspecified: Secondary | ICD-10-CM | POA: Diagnosis not present

## 2016-03-23 DIAGNOSIS — I5022 Chronic systolic (congestive) heart failure: Secondary | ICD-10-CM | POA: Diagnosis not present

## 2016-03-23 DIAGNOSIS — N184 Chronic kidney disease, stage 4 (severe): Secondary | ICD-10-CM | POA: Diagnosis not present

## 2016-03-23 DIAGNOSIS — I25700 Atherosclerosis of coronary artery bypass graft(s), unspecified, with unstable angina pectoris: Secondary | ICD-10-CM | POA: Diagnosis not present

## 2016-03-23 DIAGNOSIS — N179 Acute kidney failure, unspecified: Secondary | ICD-10-CM | POA: Diagnosis not present

## 2016-03-23 DIAGNOSIS — I214 Non-ST elevation (NSTEMI) myocardial infarction: Secondary | ICD-10-CM | POA: Diagnosis not present

## 2016-03-23 DIAGNOSIS — I482 Chronic atrial fibrillation: Secondary | ICD-10-CM | POA: Diagnosis not present

## 2016-03-23 LAB — BASIC METABOLIC PANEL
Anion gap: 10 (ref 5–15)
BUN: 89 mg/dL — ABNORMAL HIGH (ref 6–20)
CALCIUM: 8.7 mg/dL — AB (ref 8.9–10.3)
CO2: 23 mmol/L (ref 22–32)
CREATININE: 5.43 mg/dL — AB (ref 0.61–1.24)
Chloride: 99 mmol/L — ABNORMAL LOW (ref 101–111)
GFR calc Af Amer: 11 mL/min — ABNORMAL LOW (ref >60)
GFR calc non Af Amer: 9 mL/min — ABNORMAL LOW (ref >60)
GLUCOSE: 160 mg/dL — AB (ref 65–99)
Potassium: 3.9 mmol/L (ref 3.5–5.1)
Sodium: 132 mmol/L — ABNORMAL LOW (ref 135–145)

## 2016-03-23 LAB — CBC
HEMATOCRIT: 26 % — AB (ref 39.0–52.0)
Hemoglobin: 8.8 g/dL — ABNORMAL LOW (ref 13.0–17.0)
MCH: 29.5 pg (ref 26.0–34.0)
MCHC: 33.8 g/dL (ref 30.0–36.0)
MCV: 87.2 fL (ref 78.0–100.0)
Platelets: 318 10*3/uL (ref 150–400)
RBC: 2.98 MIL/uL — ABNORMAL LOW (ref 4.22–5.81)
RDW: 16.8 % — AB (ref 11.5–15.5)
WBC: 8.3 10*3/uL (ref 4.0–10.5)

## 2016-03-23 LAB — HEPARIN LEVEL (UNFRACTIONATED)
Heparin Unfractionated: 0.89 IU/mL — ABNORMAL HIGH (ref 0.30–0.70)
Heparin Unfractionated: 0.56 IU/mL (ref 0.30–0.70)

## 2016-03-23 LAB — APTT: aPTT: 108 seconds — ABNORMAL HIGH (ref 24–37)

## 2016-03-23 LAB — GLUCOSE, CAPILLARY
GLUCOSE-CAPILLARY: 133 mg/dL — AB (ref 65–99)
GLUCOSE-CAPILLARY: 189 mg/dL — AB (ref 65–99)
GLUCOSE-CAPILLARY: 272 mg/dL — AB (ref 65–99)
Glucose-Capillary: 129 mg/dL — ABNORMAL HIGH (ref 65–99)

## 2016-03-23 MED ORDER — DEXTROSE 5 % IV SOLN
250.0000 mg | INTRAVENOUS | Status: AC
  Administered 2016-03-23: 250 mg via INTRAVENOUS
  Filled 2016-03-23: qty 250

## 2016-03-23 NOTE — Research (Signed)
LEADERS Free Study Informed Consent   Subject Name: Kyle [name]  Subject met inclusion and exclusion criteria.  The informed consent form, study requirements and expectations were reviewed with the subject and questions and concerns were addressed prior to the signing of the consent form.  The subject verbalized understanding of the trail requirements.  The subject agreed to participate in the Leaders Free trial and signed the informed consent.  The informed consent was obtained prior to performance of any protocol-specific procedures for the subject.  A copy of the signed informed consent was given to the subject and a copy was placed in the subject's medical record.  Sandie Ano 03/23/2016, 7:45

## 2016-03-23 NOTE — Care Management Important Message (Signed)
Important Message  Patient Details  Name: Kyle [name] MRN: [id number] Date of Birth: 1941-01-11   Medicare Important Message Given:  Yes    Loann Quill 03/23/2016, 8:58 AM

## 2016-03-23 NOTE — Progress Notes (Signed)
Admit: 03/19/2016 LOS: 4  6M AoCKD (vs CKD Progression), NSTEMI, AoC CHF Exacerbation  Subjective:  LHC postponed overnight Lasix gtt on hold, some hydration overnight GFR stable  07/09 0701 - 07/10 0700 In: 1230.5 [P.O.:600; I.V.:505.5; IV Piggyback:125] Out: 1250 [Urine:1250]  Filed Weights   03/21/16 0506 03/22/16 0500 03/23/16 0448  Weight: 88.089 kg (194 lb 3.2 oz) 87.952 kg (193 lb 14.4 oz) 88.315 kg (194 lb 11.2 oz)    Scheduled Meds: . acidophilus  1 capsule Oral Daily  . allopurinol  100 mg Oral Daily  . amLODipine  5 mg Oral Daily  . aspirin EC  81 mg Oral Daily  . azithromycin  250 mg Intravenous Q24H  . carvedilol  25 mg Oral BID WC  . cloNIDine  0.2 mg Transdermal Weekly  . clopidogrel  75 mg Oral Daily  . ferrous sulfate  325 mg Oral Q breakfast  . hydrALAZINE  50 mg Oral Q8H  . insulin aspart  0-9 Units Subcutaneous TID WC  . insulin NPH Human  20 Units Subcutaneous BID AC & HS  . isosorbide mononitrate  60 mg Oral Daily  . loratadine  10 mg Oral Daily  . pantoprazole  40 mg Oral Q1200  . ranolazine  500 mg Oral BID  . rOPINIRole  4 mg Oral QHS  . rosuvastatin  10 mg Oral q1800  . sodium chloride flush  3 mL Intravenous Q12H  . sodium chloride flush  3 mL Intravenous Q12H  . sodium chloride flush  3 mL Intravenous Q12H  . tamsulosin  0.4 mg Oral Daily   Continuous Infusions: . sodium chloride 10 mL/hr at 03/23/16 0818  . sodium chloride 1 mL/kg/hr (03/23/16 0658)  . heparin 1,200 Units/hr (03/23/16 0630)   PRN Meds:.sodium chloride, sodium chloride, acetaminophen **OR** acetaminophen, albuterol, hydrALAZINE, nitroGLYCERIN, ondansetron **OR** ondansetron (ZOFRAN) IV, oxyCODONE, sodium chloride flush, sodium chloride flush  Current Labs: reviewed AM labs pending   Physical Exam:  Blood pressure 126/54, pulse 67, temperature 97.4 F (36.3 C), temperature source Oral, resp. rate 18, height 5\' 8"  (1.727 m), weight 88.315 kg (194 lb 11.2 oz), SpO2 94  %. IRIR, no rub. Nl s1s2 Diminished in bases Trace LEE No rashes/lesions NAD, nl WOB No c/c/e  A 1. AoCKD4 1. Looks like has had progressive inc in SCr over past months, had f/u appt at [place] in near future 2. Suspect current issues related to CHF/NSTEMI related cardiorenal 3. Obstruction excluded by renal US 7/7 4. Nearing dialysis but not absolute indication currently 2. NSTEMI needing LHC/RHC 3. ?systolic CHF exacerbation -- +Pleural effusions and edema 4. CAP on Azithro, defervesced 5. HTN 6. COPD  Plan 1. Will stop IVFs pre LHC; would not restart prior to tomorrow LHC 2. Have at length discussed risk of CIN and need for RRT in settting of LHC.  He understands and recognizes, wishing to move forward with catehterization.  Will follow renal function closely and if needs HD he is accepting 3. Cont to hold lasix gtt 4. Daily weights, Daily Renal Panel, Strict I/Os, Avoid nephrotoxins (NSAIDs, judicious IV Contrast)   Pearson Grippe MD 03/23/2016, 12:53 PM   Recent Labs Lab 03/21/16 0949 03/21/16 1603 03/22/16 0446 03/23/16 0453  NA 130* 131* 130* 132*  K 4.0 4.0 3.9 3.9  CL 99* 100* 99* 99*  CO2 20* 20* 20* 23  GLUCOSE 339* 306* 274* 160*  BUN 92* 93* 92* 89*  CREATININE 5.36* 5.45* 5.36* 5.43*  CALCIUM 8.8* 8.7* 8.7* 8.7*  PHOS 5.2*  --   --   --     Recent Labs Lab 03/21/16 0415 03/22/16 0446 03/23/16 0453  WBC 6.3 7.0 8.3  HGB 8.6* 8.4* 8.8*  HCT 26.7* 25.8* 26.0*  MCV 88.4 89.3 87.2  PLT 310 325 318

## 2016-03-23 NOTE — Progress Notes (Signed)
ANTICOAGULATION CONSULT NOTE - Follow Up Consult  Pharmacy Consult for Heparin (apixaban on hold) Indication: chest pain/ACS, afib  Allergies  Allergen Reactions  . Statins Other (See Comments)    Pt has tried 4-5 diff kinds. 03/20/2016 Pt reports no reaction or allergy, but has tried different types of statins to figure out which works best for him.    Patient Measurements: Height: 5\' 8"  (172.7 cm) Weight: 194 lb 11.2 oz (88.315 kg) IBW/kg (Calculated) : 68.4  Vital Signs: Temp: 98 F (36.7 C) (07/10 0448) Temp Source: Oral (07/10 0448) BP: 122/52 mmHg (07/10 0623) Pulse Rate: 65 (07/10 0448)  Labs:  Recent Labs  03/20/16 0807  03/21/16 0415  03/21/16 1603 03/21/16 2337 03/22/16 0446 03/23/16 0453  HGB 9.2*  --  8.6*  --   --   --  8.4* 8.8*  HCT 27.6*  --  26.7*  --   --   --  25.8* 26.0*  PLT 313  --  310  --   --   --  325 318  APTT  --   < > 89*  < >  --  93* 95* 108*  HEPARINUNFRC  --   --  0.92*  --   --   --  0.82* 0.89*  CREATININE  --   < >  --   < > 5.45*  --  5.36* 5.43*  TROPONINI 0.60*  --   --   --   --   --   --   --   < > = values in this interval not displayed.  Estimated Creatinine Clearance: 12.5 mL/min (by C-G formula based on Cr of 5.43).   Assessment: Tx from [place], NSTEMI in setting of SOB/renal failure, both HL and aPTT are elevated at 0.89 and 108, respectively. They HL and aPTT appear close to correlation this AM, so will stop using aPTT and start using HL to dose.   Goal of Therapy:  Heparin level 0.3-0.7 units/mL Monitor platelets by anticoagulation protocol: Yes   Plan:  -Decrease heparin to 1200 units/hr -1500 HL  Tanner Yeley 03/23/2016,6:26 AM

## 2016-03-23 NOTE — Progress Notes (Signed)
ANTICOAGULATION CONSULT NOTE - Follow Up Consult  Pharmacy Consult for Heparin (apixaban on hold) Indication: chest pain/ACS, afib  Allergies  Allergen Reactions  . Statins Other (See Comments)    Pt has tried 4-5 diff kinds. 03/20/2016 Pt reports no reaction or allergy, but has tried different types of statins to figure out which works best for him.    Patient Measurements: Height: 5\' 8"  (172.7 cm) Weight: 194 lb 11.2 oz (88.315 kg) IBW/kg (Calculated) : 68.4  Vital Signs: Temp: 97.4 F (36.3 C) (07/10 0750) Temp Source: Oral (07/10 0750) BP: 126/54 mmHg (07/10 0750) Pulse Rate: 67 (07/10 0750)  Labs:  Recent Labs  03/21/16 0415  03/21/16 1603 03/21/16 2337 03/22/16 0446 03/23/16 0453 03/23/16 1513  HGB 8.6*  --   --   --  8.4* 8.8*  --   HCT 26.7*  --   --   --  25.8* 26.0*  --   PLT 310  --   --   --  325 318  --   APTT 89*  < >  --  93* 95* 108*  --   HEPARINUNFRC 0.92*  --   --   --  0.82* 0.89* 0.56  CREATININE  --   < > 5.45*  --  5.36* 5.43*  --   < > = values in this interval not displayed.  Estimated Creatinine Clearance: 12.5 mL/min (by C-G formula based on Cr of 5.43).  . sodium chloride 10 mL/hr at 03/23/16 0818  . heparin 1,200 Units/hr (03/23/16 0630)     Assessment: Tx from [place], NSTEMI in setting of SOB/renal failure.  On heparin for acute NSTEMI with heparin level now in the therapeutic range.  No bleeding or complications noted.  Planning LHC tomorrow per MD notes.  Goal of Therapy:  Heparin level 0.3-0.7 units/mL Monitor platelets by anticoagulation protocol: Yes   Plan:  -Continue IV heparin at current rate. -Confirm heparin level with AM labs. -F/u plan for heparin after cath tomorrow.  Uvaldo Rising, BCPS  Clinical Pharmacist Pager [phone]  03/23/2016 5:04 PM

## 2016-03-23 NOTE — Plan of Care (Signed)
Problem: Education: Goal: Knowledge of  General Education information/materials will improve Outcome: Progressing Reviewed information regarding patient's heart cath in the AM and questions answered, patient and family verbalized understanding, will continue to monitor.

## 2016-03-23 NOTE — Progress Notes (Signed)
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    CURRENT MEDS . acidophilus  1 capsule Oral Daily  . allopurinol  100 mg Oral Daily  . amLODipine  5 mg Oral Daily  . aspirin EC  81 mg Oral Daily  . azithromycin  250 mg Intravenous Q24H  . carvedilol  25 mg Oral BID WC  . cloNIDine  0.2 mg Transdermal Weekly  . clopidogrel  75 mg Oral Daily  . ferrous sulfate  325 mg Oral Q breakfast  . hydrALAZINE  50 mg Oral Q8H  . insulin aspart  0-9 Units Subcutaneous TID WC  . insulin NPH Human  20 Units Subcutaneous BID AC & HS  . isosorbide mononitrate  60 mg Oral Daily  . loratadine  10 mg Oral Daily  . pantoprazole  40 mg Oral Q1200  . ranolazine  500 mg Oral BID  . rOPINIRole  4 mg Oral QHS  . rosuvastatin  10 mg Oral q1800  . sodium chloride flush  3 mL Intravenous Q12H  . sodium chloride flush  3 mL Intravenous Q12H  . sodium chloride flush  3 mL Intravenous Q12H  . tamsulosin  0.4 mg Oral Daily    OBJECTIVE   Intake/Output Summary (Last 24 hours) at 03/23/16 0934 Last data filed at 03/23/16 0901  Gross per 24 hour  Intake 1110.5 ml  Output   1500 ml  Net -389.5 ml   Filed Weights   03/21/16 0506 03/22/16 0500 03/23/16 0448  Weight: 88.089 kg (194 lb 3.2 oz) 87.952 kg (193 lb  14.4 oz) 88.315 kg (194 lb 11.2 oz)    PHYSICAL EXAM Filed Vitals:   03/22/16 2132 03/23/16 0448 03/23/16 0623 03/23/16 0750  BP: 129/53 129/89 122/52 126/54  Pulse:  65  67  Temp:  98 F (36.7 C)  97.4 F (36.3 C)  TempSrc:  Oral  Oral  Resp:  16  18  Height:      Weight:  88.315 kg (194 lb 11.2 oz)    SpO2:  96%  94%   General: Alert, oriented x3, no distress Head: no evidence of trauma, PERRL, EOMI, no exophtalmos or lid lag, no myxedema, no xanthelasma; normal ears, nose and oropharynx Neck: normal jugular venous pulsations and no hepatojugular reflux; brisk carotid pulses without delay and no carotid bruits Chest: clear to auscultation, no signs of consolidation by percussion or palpation, normal fremitus, symmetrical and full respiratory excursions Cardiovascular: normal position and quality of the apical impulse, regular rhythm, normal first and second heart sounds, no rubs, +ve loud S4 gallop, no murmur Abdomen: no tenderness or distention, no masses by palpation, no abnormal pulsatility or arterial bruits, normal bowel sounds, no hepatosplenomegaly Extremities: no clubbing, cyanosis or edema; absent left radial (harvested for CABG), normal right radial  and ulnar pulses; 2+ right femoral, posterior tibial and dorsalis pedis pulses; 2+ left femoral, posterior tibial and dorsalis pedis pulses; no subclavian or femoral bruits Neurological: grossly nonfocal  LABS  CBC  Recent Labs  03/22/16 0446 03/23/16 0453  WBC 7.0 8.3  HGB 8.4* 8.8*  HCT 25.8* 26.0*  MCV 89.3 87.2  PLT 325 0000000   Basic Metabolic Panel  Recent Labs  03/21/16 0949  03/22/16 0446 03/23/16 0453  NA 130*  < > 130* 132*  K 4.0  < > 3.9 3.9  CL 99*  < > 99* 99*  CO2 20*  < > 20* 23  GLUCOSE 339*  < > 274* 160*  BUN 92*  < > 92* 89*  CREATININE 5.36*  < > 5.36* 5.43*  CALCIUM 8.8*  < > 8.7* 8.7*  PHOS 5.2*  --   --   --   < > = values in this interval not displayed. Liver Function  Tests  Recent Labs  03/21/16 0949  ALBUMIN 2.5*    Radiology Studies Imaging results have been reviewed and No results found.  TELE NSR  ECG NSR, old inferior MI, prominent inferolateral ST-T changes  ASSESSMENT AND PLAN  1. CAD s/p CABG and acute NSTEMI :for cath today. Decide on revascularization strategy keeping renal function in mind. Left radial has been harvested and he needs future AV fistula on right. Femoral access preferred..  2. Chronic kidney disease. His creatinine is above 5. Odds are very high he will need dialysis and he understands this. Nephrology is following.  3. COPD  4. Hyperlipidemia: On Crestor 10 mg a day   5. Paroxysmal atrial fib: He remains in normal sinus rhythm.  6. Chronic systolic heart failure: he appears euvolemic clinically  7. DM with renal and vascular complications, on insulin   Sanda Klein, MD, [facility] [phone] office [phone] pager 03/23/2016 9:34 AM

## 2016-03-23 NOTE — Progress Notes (Signed)
Updated report received in patient's room via [place] RN using [employer], reviewed new orders, VS, meds and general events of the day, assumed care of patient.

## 2016-03-23 NOTE — Progress Notes (Signed)
Patient ID: Kyle [name], male   DOB: 27-May-1941, 75 y.o.   MRN: [id number]   PROGRESS NOTE    JULLIAN KAZLAUSKAS  [id number] DOB: [birth year] DOA: 03/19/2016  PCP: Wende Neighbors, MD   Brief Narrative:  75 y.o. male with hypertension, hyperlipidemia, diabetes mellitus, COPD, GERD, gout, depression, anxiety, CAD, s/p of CABG, congestive heart failure, CKD-IV, BPH, anemia, remote history of alcoholism, atrial fibrillation on Eliquis, presented from [facility] for further evaluation of progressive dyspnea with exertion and occasionally present at rest. Pt presented to [facility] 7/3 and at that time had fevers up to 101.86F and oxygen saturations in 70's. He was treated with IV Rocephin, Zithromax, Zosyn, IV lasix. His troponins were elevated and continued to increase 2.8>>5.3>> 6.6>>6.0, consistent with NSTEMI. Cardiology was consulted, patient was treated with heparin drip. Of note, pt has CKD-IV, and was seen by renal in the past, HD recommended even 3 years ago, but patient refused dialysis in the past.   Assessment & Plan:   Principal Problem:   Acute respiratory failure with hypoxia (HCC) - secondary to acute systolic CHF, ? CAP, AoCKD4 - currently on lasix drip, managed by nephrology team  - pt currently off oxygen, comfortable, maintaining oxygen saturation at target range   Active Problems:   NSTEMI, acute systolic CHF - cardiology will plan R/L heart cath today, further recommendations pending cath results  - appreciate cardio team following     Acute on chronic kidney disease stage IV - with progressive increase in Cr in the past few months  - now Cr up likely from cardiorenal etiology, NSTEMI, Cr slightly better this AM - nephrology team following, appreciate assistance  - renal US unremarkable  - strict I/O, daily weights     Atrial fibrillation ([place]), chronic, CHADS2 vasc score 4-5 - on Eliquis at home, holding for now - continue Heparin drip     Essential  hypertension - reasonable inpatient control  - on Hydralazine, Imdur, Coreg, Clonidine     Hyponatremia - mild, stable in the past 48 hours  - BMP in AM     DM due to underlying condition with diabetic chronic kidney disease ([place]) - continue insulin NPH as well as SSI    Anemia of chronic disease, kidney disease - no sings of bleeding - CBC In AM    ? Bilateral PNA, unknown pathogen - currently on Zithromax, day # 5/5    Obesity - Body mass index is 29.84 kg/(m^2).   DVT prophylaxis: on Heparin drip  Code Status: Partial code, no intubation but OK with everything else per ACLS protocol if needed  Family Communication: Patient and family at bedside  Disposition Plan: Home once consultants clear   Consultants:   Cardiology  Nephrology  PT/OT  Procedures:   None  Antimicrobials:   Zithromax 7/6 -->  Subjective: Reports feeling better.   Objective: Filed Vitals:   03/22/16 2132 03/23/16 0448 03/23/16 0623 03/23/16 0750  BP: 129/53 129/89 122/52 126/54  Pulse:  65  67  Temp:  98 F (36.7 C)  97.4 F (36.3 C)  TempSrc:  Oral  Oral  Resp:  16  18  Height:      Weight:  88.315 kg (194 lb 11.2 oz)    SpO2:  96%  94%    Intake/Output Summary (Last 24 hours) at 03/23/16 1127 Last data filed at 03/23/16 0901  Gross per 24 hour  Intake 1110.5 ml  Output   1500 ml  Net -  389.5 ml   Filed Weights   03/21/16 0506 03/22/16 0500 03/23/16 0448  Weight: 88.089 kg (194 lb 3.2 oz) 87.952 kg (193 lb 14.4 oz) 88.315 kg (194 lb 11.2 oz)    Examination:  General exam: Appears calm and comfortable  Respiratory system: Respiratory effort normal. Diminished breath sounds at bases  Cardiovascular system: RRR. No JVD, rubs, gallops or clicks.Trace bilateral LE edema Gastrointestinal system: Abdomen is slightly distended, soft and nontender.  Central nervous system: Alert and oriented. No focal neurological deficits. Extremities: Symmetric 5 x 5 power. Skin: No rashes,  lesions or ulcers Psychiatry: Judgement and insight appear normal. Mood & affect appropriate.   Data Reviewed: I have personally reviewed following labs and imaging studies  CBC:  Recent Labs Lab 03/20/16 0807 03/21/16 0415 03/22/16 0446 03/23/16 0453  WBC 6.6 6.3 7.0 8.3  HGB 9.2* 8.6* 8.4* 8.8*  HCT 27.6* 26.7* 25.8* 26.0*  MCV 89.3 88.4 89.3 87.2  PLT 313 310 325 0000000   Basic Metabolic Panel:  Recent Labs Lab 03/20/16 2022 03/21/16 0949 03/21/16 1603 03/22/16 0446 03/23/16 0453  NA 130* 130* 131* 130* 132*  K 4.0 4.0 4.0 3.9 3.9  CL 101 99* 100* 99* 99*  CO2 20* 20* 20* 20* 23  GLUCOSE 279* 339* 306* 274* 160*  BUN 90* 92* 93* 92* 89*  CREATININE 5.24* 5.36* 5.45* 5.36* 5.43*  CALCIUM 8.7* 8.8* 8.7* 8.7* 8.7*  PHOS  --  5.2*  --   --   --    Coagulation Profile:  Recent Labs Lab 03/19/16 2114  INR 1.30   Cardiac Enzymes:  Recent Labs Lab 03/19/16 2114 03/20/16 0308 03/20/16 0807  TROPONINI 0.94* 0.66* 0.60*   CBG:  Recent Labs Lab 03/22/16 0733 03/22/16 1123 03/22/16 1630 03/22/16 2130 03/23/16 0750  GLUCAP 253* 217* 201* 193* 133*   Urine analysis:    Component Value Date/Time   COLORURINE YELLOW 03/20/2016 1354   APPEARANCEUR CLOUDY* 03/20/2016 1354   LABSPEC 1.020 03/20/2016 1354   PHURINE 5.0 03/20/2016 1354   GLUCOSEU 250* 03/20/2016 1354   HGBUR NEGATIVE 03/20/2016 1354   BILIRUBINUR NEGATIVE 03/20/2016 1354   KETONESUR NEGATIVE 03/20/2016 1354   PROTEINUR 100* 03/20/2016 1354   NITRITE NEGATIVE 03/20/2016 1354   LEUKOCYTESUR MODERATE* 03/20/2016 1354   Radiology Studies: No results found.    Scheduled Meds: . acidophilus  1 capsule Oral Daily  . allopurinol  100 mg Oral Daily  . amLODipine  5 mg Oral Daily  . aspirin EC  81 mg Oral Daily  . azithromycin  250 mg Intravenous Q24H  . carvedilol  25 mg Oral BID WC  . cloNIDine  0.2 mg Transdermal Weekly  . clopidogrel  75 mg Oral Daily  . ferrous sulfate  325 mg Oral Q  breakfast  . hydrALAZINE  50 mg Oral Q8H  . insulin aspart  0-9 Units Subcutaneous TID WC  . insulin NPH Human  20 Units Subcutaneous BID AC & HS  . isosorbide mononitrate  60 mg Oral Daily  . loratadine  10 mg Oral Daily  . pantoprazole  40 mg Oral Q1200  . ranolazine  500 mg Oral BID  . rOPINIRole  4 mg Oral QHS  . rosuvastatin  10 mg Oral q1800  . sodium chloride flush  3 mL Intravenous Q12H  . sodium chloride flush  3 mL Intravenous Q12H  . sodium chloride flush  3 mL Intravenous Q12H  . tamsulosin  0.4 mg Oral Daily   Continuous  Infusions: . sodium chloride 10 mL/hr at 03/23/16 0818  . sodium chloride 1 mL/kg/hr (03/23/16 0658)  . heparin 1,200 Units/hr (03/23/16 0630)     LOS: 4 days    Time spent: 20 minutes    Faye Ramsay, MD [facility] Pager [phone]  If 7PM-7AM, please contact night-coverage [email or web address] Password [email or web address] 03/23/2016, 11:27 AM

## 2016-03-23 NOTE — Plan of Care (Signed)
Problem: Activity: Goal: Ability to tolerate increased activity will improve Outcome: Progressing Up ambulating with PT tolerated well, denied CP or discomfort, able to perform bed bath set up assistance.

## 2016-03-23 NOTE — Progress Notes (Signed)
Physical Therapy Treatment Patient Details Name: Kyle [name] MRN: [id number] DOB: [birth year] Today's Date: 03/23/2016    History of Present Illness 75 y.o. male admitted with acute respiratory failure, CAP, NSTEMI. PMH of DM, BPH, MI, gout, COPD, CKD, CAD.     PT Comments    Patient making steady progress towards PT goals. Ambulated increased distance on room air today with improved tolerance. Patient with moderate DOE. Educated and performed pursed lip breathing techniques. Patient receptive. Patient hoping that DOE will improved after heart cath today. Encouragement provided. Will continue to see and progress as tolerated.  Follow Up Recommendations  Supervision for mobility/OOB;Other (comment);Home health PT (? cardiopulmonary rehab vs. HHPT depending on progress)     Equipment Recommendations  Other (comment) (rollator for energy conservation)    Recommendations for Other Services       Precautions / Restrictions Precautions Precautions: Fall Restrictions Weight Bearing Restrictions: No    Mobility  Bed Mobility Overal bed mobility: Needs Assistance Bed Mobility: Supine to Sit;Sit to Supine     Supine to sit: Min assist Sit to supine: Supervision   General bed mobility comments: min assist  (by choice) to come to EOB with UE pull to sit  Transfers Overall transfer level: Needs assistance Equipment used: Rolling walker (2 wheeled) Transfers: Sit to/from Stand Sit to Stand: Min assist         General transfer comment: VCs for hand placement, min assist to power to standing from low bed height  Ambulation/Gait Ambulation/Gait assistance: Supervision Ambulation Distance (Feet): 140 Feet Assistive device: Rolling walker (2 wheeled) Gait Pattern/deviations: Step-through pattern;Decreased stride length   Gait velocity interpretation: Below normal speed for age/gender General Gait Details: VCs for upright posture and to look up. patient mobilizing with improved  stability using RW, mainly limited by DOE. Ambulated on room air with 1 standing rest break   Stairs            Wheelchair Mobility    Modified Rankin (Stroke Patients Only)       Balance Overall balance assessment: No apparent balance deficits (not formally assessed)                                  Cognition Arousal/Alertness: Awake/alert Behavior During Therapy: WFL for tasks assessed/performed Overall Cognitive Status: Within Functional Limits for tasks assessed                      Exercises      General Comments General comments (skin integrity, edema, etc.): patient reports moderate DOE with activity, educated on technique for pursed lib breathing and deep inahlation, patient performed with some VCs.       Pertinent Vitals/Pain Pain Assessment: No/denies pain    Home Living                      Prior Function            PT Goals (current goals can now be found in the care plan section) Acute Rehab PT Goals Patient Stated Goal: return to independence with mobility PT Goal Formulation: With patient/family Time For Goal Achievement: 04/03/16 Potential to Achieve Goals: Good Progress towards PT goals: Progressing toward goals    Frequency  Min 3X/week    PT Plan Current plan remains appropriate    Co-evaluation             End  of Session Equipment Utilized During Treatment: Gait belt Activity Tolerance: Patient limited by fatigue Patient left: in bed;with call bell/phone within reach;with family/visitor present     Time: 1100-1120 PT Time Calculation (min) (ACUTE ONLY): 20 min  Charges:  $Gait Training: 8-22 mins                    G CodesDuncan Dull 03/26/2016, 11:27 AM Alben Deeds, PT DPT  [phone]

## 2016-03-24 ENCOUNTER — Encounter (HOSPITAL_COMMUNITY)
Admission: AD | Disposition: A | Discharge status: Home / Self Care | Payer: Self-pay | Source: Other Acute Inpatient Hospital | Attending: Internal Medicine

## 2016-03-24 DIAGNOSIS — N184 Chronic kidney disease, stage 4 (severe): Secondary | ICD-10-CM | POA: Diagnosis not present

## 2016-03-24 DIAGNOSIS — I482 Chronic atrial fibrillation: Secondary | ICD-10-CM | POA: Diagnosis not present

## 2016-03-24 DIAGNOSIS — I214 Non-ST elevation (NSTEMI) myocardial infarction: Secondary | ICD-10-CM | POA: Diagnosis not present

## 2016-03-24 DIAGNOSIS — I25119 Atherosclerotic heart disease of native coronary artery with unspecified angina pectoris: Secondary | ICD-10-CM | POA: Insufficient documentation

## 2016-03-24 DIAGNOSIS — I129 Hypertensive chronic kidney disease with stage 1 through stage 4 chronic kidney disease, or unspecified chronic kidney disease: Secondary | ICD-10-CM | POA: Diagnosis not present

## 2016-03-24 DIAGNOSIS — J449 Chronic obstructive pulmonary disease, unspecified: Secondary | ICD-10-CM | POA: Diagnosis not present

## 2016-03-24 DIAGNOSIS — I2510 Atherosclerotic heart disease of native coronary artery without angina pectoris: Secondary | ICD-10-CM | POA: Diagnosis not present

## 2016-03-24 DIAGNOSIS — J9601 Acute respiratory failure with hypoxia: Secondary | ICD-10-CM | POA: Diagnosis not present

## 2016-03-24 DIAGNOSIS — N179 Acute kidney failure, unspecified: Secondary | ICD-10-CM | POA: Diagnosis not present

## 2016-03-24 DIAGNOSIS — I480 Paroxysmal atrial fibrillation: Secondary | ICD-10-CM | POA: Diagnosis not present

## 2016-03-24 HISTORY — PX: CARDIAC CATHETERIZATION: SHX172

## 2016-03-24 LAB — CBC
HCT: 25.9 % — ABNORMAL LOW (ref 39.0–52.0)
HEMOGLOBIN: 8.6 g/dL — AB (ref 13.0–17.0)
MCH: 29.5 pg (ref 26.0–34.0)
MCHC: 33.2 g/dL (ref 30.0–36.0)
MCV: 88.7 fL (ref 78.0–100.0)
PLATELETS: 342 10*3/uL (ref 150–400)
RBC: 2.92 MIL/uL — AB (ref 4.22–5.81)
RDW: 16.9 % — ABNORMAL HIGH (ref 11.5–15.5)
WBC: 8.9 10*3/uL (ref 4.0–10.5)

## 2016-03-24 LAB — RENAL FUNCTION PANEL
ANION GAP: 10 (ref 5–15)
Albumin: 2.7 g/dL — ABNORMAL LOW (ref 3.5–5.0)
BUN: 84 mg/dL — ABNORMAL HIGH (ref 6–20)
CHLORIDE: 101 mmol/L (ref 101–111)
CO2: 21 mmol/L — AB (ref 22–32)
Calcium: 8.7 mg/dL — ABNORMAL LOW (ref 8.9–10.3)
Creatinine, Ser: 5.07 mg/dL — ABNORMAL HIGH (ref 0.61–1.24)
GFR calc non Af Amer: 10 mL/min — ABNORMAL LOW (ref >60)
GFR, EST AFRICAN AMERICAN: 12 mL/min — AB (ref >60)
Glucose, Bld: 190 mg/dL — ABNORMAL HIGH (ref 65–99)
POTASSIUM: 3.8 mmol/L (ref 3.5–5.1)
Phosphorus: 5 mg/dL — ABNORMAL HIGH (ref 2.5–4.6)
Sodium: 132 mmol/L — ABNORMAL LOW (ref 135–145)

## 2016-03-24 LAB — POCT I-STAT 3, VENOUS BLOOD GAS (G3P V)
Acid-base deficit: 4 mmol/L — ABNORMAL HIGH (ref 0.0–2.0)
BICARBONATE: 21.1 meq/L (ref 20.0–24.0)
O2 Saturation: 56 %
PH VEN: 7.378 — AB (ref 7.250–7.300)
PO2 VEN: 30 mmHg — AB (ref 31.0–45.0)
TCO2: 22 mmol/L (ref 0–100)
pCO2, Ven: 35.9 mmHg — ABNORMAL LOW (ref 45.0–50.0)

## 2016-03-24 LAB — POCT I-STAT 3, ART BLOOD GAS (G3+)
ACID-BASE DEFICIT: 4 mmol/L — AB (ref 0.0–2.0)
Bicarbonate: 20.3 mEq/L (ref 20.0–24.0)
O2 Saturation: 88 %
PCO2 ART: 32.3 mmHg — AB (ref 35.0–45.0)
PO2 ART: 53 mmHg — AB (ref 80.0–100.0)
TCO2: 21 mmol/L (ref 0–100)
pH, Arterial: 7.406 (ref 7.350–7.450)

## 2016-03-24 LAB — GLUCOSE, CAPILLARY
GLUCOSE-CAPILLARY: 91 mg/dL (ref 65–99)
GLUCOSE-CAPILLARY: 98 mg/dL (ref 65–99)
Glucose-Capillary: 176 mg/dL — ABNORMAL HIGH (ref 65–99)
Glucose-Capillary: 89 mg/dL (ref 65–99)
Glucose-Capillary: 168 mg/dL — ABNORMAL HIGH (ref 65–99)

## 2016-03-24 LAB — HEPARIN LEVEL (UNFRACTIONATED): Heparin Unfractionated: 0.52 IU/mL (ref 0.30–0.70)

## 2016-03-24 SURGERY — RIGHT/LEFT HEART CATH AND CORONARY/GRAFT ANGIOGRAPHY
Anesthesia: LOCAL

## 2016-03-24 MED ORDER — HEPARIN (PORCINE) IN NACL 2-0.9 UNIT/ML-% IJ SOLN
INTRAMUSCULAR | Status: AC
  Filled 2016-03-24: qty 1000

## 2016-03-24 MED ORDER — SODIUM CHLORIDE 0.9% FLUSH
3.0000 mL | Freq: Two times a day (BID) | INTRAVENOUS | Status: DC
  Administered 2016-03-24 – 2016-03-26 (×5): 3 mL via INTRAVENOUS

## 2016-03-24 MED ORDER — FENTANYL CITRATE (PF) 100 MCG/2ML IJ SOLN
INTRAMUSCULAR | Status: AC
  Filled 2016-03-24: qty 2

## 2016-03-24 MED ORDER — FENTANYL CITRATE (PF) 100 MCG/2ML IJ SOLN
INTRAMUSCULAR | Status: DC | PRN
  Administered 2016-03-24: 25 ug via INTRAVENOUS

## 2016-03-24 MED ORDER — SODIUM CHLORIDE 0.9 % IV SOLN: 250.0000 mL | INTRAVENOUS | Status: DC | PRN

## 2016-03-24 MED ORDER — IOPAMIDOL (ISOVUE-370) INJECTION 76%
INTRAVENOUS | Status: DC | PRN
  Administered 2016-03-24: 72 mL via INTRA_ARTERIAL

## 2016-03-24 MED ORDER — SODIUM CHLORIDE 0.9% FLUSH: 3.0000 mL | INTRAVENOUS | Status: DC | PRN

## 2016-03-24 MED ORDER — LIDOCAINE HCL (PF) 1 % IJ SOLN
INTRAMUSCULAR | Status: AC
  Filled 2016-03-24: qty 30

## 2016-03-24 MED ORDER — MIDAZOLAM HCL 2 MG/2ML IJ SOLN
INTRAMUSCULAR | Status: AC
  Filled 2016-03-24: qty 2

## 2016-03-24 MED ORDER — HEPARIN (PORCINE) IN NACL 2-0.9 UNIT/ML-% IJ SOLN
INTRAMUSCULAR | Status: DC | PRN
  Administered 2016-03-24: 1000 mL

## 2016-03-24 MED ORDER — SODIUM CHLORIDE 0.9 % IV SOLN
INTRAVENOUS | Status: DC
Start: 2016-03-24 — End: 2016-03-26
  Administered 2016-03-24: 22:00:00 via INTRAVENOUS

## 2016-03-24 MED ORDER — IOPAMIDOL (ISOVUE-370) INJECTION 76%
INTRAVENOUS | Status: AC
  Filled 2016-03-24: qty 125

## 2016-03-24 MED ORDER — DIAZEPAM 5 MG PO TABS: 5.0000 mg | ORAL_TABLET | Freq: Four times a day (QID) | ORAL | Status: DC | PRN

## 2016-03-24 MED ORDER — LIDOCAINE HCL (PF) 1 % IJ SOLN
INTRAMUSCULAR | Status: DC | PRN
  Administered 2016-03-24: 20 mL
  Administered 2016-03-24: 5 mL

## 2016-03-24 MED ORDER — ASPIRIN EC 81 MG PO TBEC: 81.0000 mg | DELAYED_RELEASE_TABLET | Freq: Every day | ORAL | Status: DC

## 2016-03-24 MED ORDER — MIDAZOLAM HCL 2 MG/2ML IJ SOLN
INTRAMUSCULAR | Status: DC | PRN
  Administered 2016-03-24 (×2): 1 mg via INTRAVENOUS

## 2016-03-24 MED ORDER — ONDANSETRON HCL 4 MG/2ML IJ SOLN: 4.0000 mg | Freq: Four times a day (QID) | INTRAMUSCULAR | Status: DC | PRN

## 2016-03-24 MED ORDER — ACETAMINOPHEN 325 MG PO TABS: 650.0000 mg | ORAL_TABLET | ORAL | Status: DC | PRN

## 2016-03-24 SURGICAL SUPPLY — 13 items
CATH INFINITI 5 FR IM (CATHETERS) ×2 IMPLANT
CATH INFINITI 5FR MULTPACK ANG (CATHETERS) ×2 IMPLANT
CATH SWAN GANZ 7F STRAIGHT (CATHETERS) ×2 IMPLANT
KIT HEART LEFT (KITS) ×2 IMPLANT
KIT HEART RIGHT NAMIC (KITS) ×2 IMPLANT
PACK CARDIAC CATHETERIZATION (CUSTOM PROCEDURE TRAY) ×2 IMPLANT
SHEATH PINNACLE 5F 10CM (SHEATH) ×2 IMPLANT
SHEATH PINNACLE 7F 10CM (SHEATH) ×2 IMPLANT
TRANSDUCER W/STOPCOCK (MISCELLANEOUS) ×4 IMPLANT
WIRE EMERALD 3MM-J .025X260CM (WIRE) ×2 IMPLANT
WIRE EMERALD 3MM-J .035X150CM (WIRE) ×4 IMPLANT
WIRE EMERALD 3MM-J .035X260CM (WIRE) ×2 IMPLANT
WIRE HI TORQ VERSACORE-J 145CM (WIRE) ×2 IMPLANT

## 2016-03-24 NOTE — Progress Notes (Addendum)
Patient ID: Kyle [name], male   DOB: 1941/07/31, 75 y.o.   MRN: [id number]   PROGRESS NOTE    Kyle [name]  [id number] DOB: Jun 10, 1941 DOA: 03/19/2016  PCP: Kyle Neighbors, MD   Brief Narrative:  75 y.o. male with hypertension, hyperlipidemia, diabetes mellitus, COPD, GERD, gout, depression, anxiety, CAD, s/p of CABG, congestive heart failure, CKD-IV, BPH, anemia, remote history of alcoholism, atrial fibrillation on Eliquis, presented from [facility] for further evaluation of progressive dyspnea with exertion and occasionally present at rest. Pt presented to [facility] 7/3 and at that time had fevers up to 101.2F and oxygen saturations in 70's. He was treated with IV Rocephin, Zithromax, Zosyn, IV lasix. His troponins were elevated and continued to increase 2.8>>5.3>> 6.6>>6.0, consistent with NSTEMI. Cardiology was consulted, patient was treated with heparin drip. Of note, pt has CKD-IV, and was seen by renal in the past, HD recommended even 3 years ago, but patient refused dialysis in the past.   Assessment & Plan:   Principal Problem:   Acute respiratory failure with hypoxia ([place]) - secondary to acute systolic CHF, ? CAP, AoCKD4 - was on lasix drip, now on hold as cath planned  - pt currently off oxygen, comfortable, maintaining oxygen saturation at target range  - cath was rescheduled for today 7/11, pt is going this AM, further recommendations pending cath   Active Problems:   NSTEMI, acute systolic CHF - cardiology plans for R/L heart cath today, further recommendations pending cath results  - appreciate cardio team following     Acute on chronic kidney disease stage IV - with progressive increase in Cr in the past few months  - now Cr up likely from cardiorenal etiology, NSTEMI, Cr slightly better this AM from 5.43 --> 5.07 - nephrology team following, appreciate assistance  - renal US unremarkable  - weight trend in the past 72 hours: 193 --> 194 --> 194 lbs -  continue to monitor strict I/O, daily weights     Atrial fibrillation ([place]), chronic, CHADS2 vasc score 4-5 - on Eliquis at home, holding for now as cath planned  - continue Heparin drip     Essential hypertension - reasonable inpatient control  - on Hydralazine, Imdur, Coreg, Clonidine     Hyponatremia - mild, stable in the past 48 hours ~ 132 - BMP in AM     DM due to underlying condition with diabetic chronic kidney disease ([place]) - continue insulin NPH as well as SSI - adjust dose of Insulin as clinically indicated     Anemia of chronic disease, kidney disease - no sings of bleeding - CBC In AM    ? Bilateral PNA, unknown pathogen - completed 5 days of Zithromax  - no fevers and no leukocytosis     Obesity - Body mass index is 29.84 kg/(m^2).   DVT prophylaxis: on Heparin drip  Code Status: Partial code, no intubation but OK with everything else per ACLS protocol if needed  Family Communication: Patient and family at bedside  Disposition Plan: Home once consultants clear, further recommendations pending cardiac cath   Consultants:   Cardiology  Nephrology  PT/OT  Procedures:   None  Antimicrobials:   Zithromax 7/6 --> 7/10  Subjective: Reports feeling better. Ready to go home.   Objective: Filed Vitals:   03/23/16 2123 03/24/16 0553 03/24/16 0735 03/24/16 0931  BP: 120/48 122/62 118/65   Pulse:  61 68   Temp:  97.7 F (36.5 C) 97.7 F (36.5  C)   TempSrc:  Oral Oral   Resp:  18 18   Height:      Weight:  88.361 kg (194 lb 12.8 oz)    SpO2:  96% 96% 95%    Intake/Output Summary (Last 24 hours) at 03/24/16 0951 Last data filed at 03/24/16 0500  Gross per 24 hour  Intake 721.85 ml  Output    350 ml  Net 371.85 ml   Filed Weights   03/22/16 0500 03/23/16 0448 03/24/16 0553  Weight: 87.952 kg (193 lb 14.4 oz) 88.315 kg (194 lb 11.2 oz) 88.361 kg (194 lb 12.8 oz)    Examination:  General exam: Appears calm and comfortable  Respiratory  system: Respiratory effort normal. Diminished breath sounds at bases  Cardiovascular system: RRR. No JVD, rubs, gallops or clicks.Trace bilateral LE edema Gastrointestinal system: Abdomen is slightly distended, soft and nontender.  Central nervous system: Alert and oriented. No focal neurological deficits. Extremities: Symmetric 5 x 5 power. Skin: No rashes, lesions or ulcers Psychiatry: Judgement and insight appear normal. Mood & affect appropriate.   Data Reviewed: I have personally reviewed following labs and imaging studies  CBC:  Recent Labs Lab 03/20/16 0807 03/21/16 0415 03/22/16 0446 03/23/16 0453 03/24/16 0551  WBC 6.6 6.3 7.0 8.3 8.9  HGB 9.2* 8.6* 8.4* 8.8* 8.6*  HCT 27.6* 26.7* 25.8* 26.0* 25.9*  MCV 89.3 88.4 89.3 87.2 88.7  PLT 313 310 325 318 [id number]   Basic Metabolic Panel:  Recent Labs Lab 03/21/16 0949 03/21/16 1603 03/22/16 0446 03/23/16 0453 03/24/16 0551  NA 130* 131* 130* 132* 132*  K 4.0 4.0 3.9 3.9 3.8  CL 99* 100* 99* 99* 101  CO2 20* 20* 20* 23 21*  GLUCOSE 339* 306* 274* 160* 190*  BUN 92* 93* 92* 89* 84*  CREATININE 5.36* 5.45* 5.36* 5.43* 5.07*  CALCIUM 8.8* 8.7* 8.7* 8.7* 8.7*  PHOS 5.2*  --   --   --  5.0*   Coagulation Profile:  Recent Labs Lab 03/19/16 2114  INR 1.30   Cardiac Enzymes:  Recent Labs Lab 03/19/16 2114 03/20/16 0308 03/20/16 0807  TROPONINI 0.94* 0.66* 0.60*   CBG:  Recent Labs Lab 03/23/16 0750 03/23/16 1126 03/23/16 1636 03/23/16 2319 03/24/16 0738  GLUCAP 133* 129* 189* 272* 176*   Urine analysis:    Component Value Date/Time   COLORURINE YELLOW 03/20/2016 1354   APPEARANCEUR CLOUDY* 03/20/2016 1354   LABSPEC 1.020 03/20/2016 1354   PHURINE 5.0 03/20/2016 1354   GLUCOSEU 250* 03/20/2016 1354   HGBUR NEGATIVE 03/20/2016 1354   BILIRUBINUR NEGATIVE 03/20/2016 1354   KETONESUR NEGATIVE 03/20/2016 1354   PROTEINUR 100* 03/20/2016 1354   NITRITE NEGATIVE 03/20/2016 1354   LEUKOCYTESUR  MODERATE* 03/20/2016 1354   Radiology Studies: No results found.    Scheduled Meds: . [MAR Hold] acidophilus  1 capsule Oral Daily  . [MAR Hold] allopurinol  100 mg Oral Daily  . [MAR Hold] amLODipine  5 mg Oral Daily  . [MAR Hold] aspirin EC  81 mg Oral Daily  . [MAR Hold] carvedilol  25 mg Oral BID WC  . [MAR Hold] cloNIDine  0.2 mg Transdermal Weekly  . [MAR Hold] clopidogrel  75 mg Oral Daily  . [MAR Hold] ferrous sulfate  325 mg Oral Q breakfast  . [MAR Hold] hydrALAZINE  50 mg Oral Q8H  . [MAR Hold] insulin aspart  0-9 Units Subcutaneous TID WC  . [MAR Hold] insulin NPH Human  20 Units Subcutaneous BID AC &  HS  . [MAR Hold] isosorbide mononitrate  60 mg Oral Daily  . [MAR Hold] loratadine  10 mg Oral Daily  . [MAR Hold] pantoprazole  40 mg Oral Q1200  . [MAR Hold] ranolazine  500 mg Oral BID  . [MAR Hold] rOPINIRole  4 mg Oral QHS  . [MAR Hold] rosuvastatin  10 mg Oral q1800  . [MAR Hold] sodium chloride flush  3 mL Intravenous Q12H  . sodium chloride flush  3 mL Intravenous Q12H  . sodium chloride flush  3 mL Intravenous Q12H  . [MAR Hold] tamsulosin  0.4 mg Oral Daily   Continuous Infusions: . sodium chloride 10 mL/hr at 03/23/16 1900  . heparin 1,200 Units/hr (03/23/16 2049)     LOS: 5 days    Time spent: 20 minutes    Faye Ramsay, MD [facility] Pager [phone]  If 7PM-7AM, please contact night-coverage [email or web address] Password [facility] 03/24/2016, 9:51 AM

## 2016-03-24 NOTE — Progress Notes (Signed)
PT Cancellation Note  Patient Details Name: COLM HERPEL MRN: [id number] DOB: [birth year]   Cancelled Treatment:    Reason Eval/Treat Not Completed: Patient at procedure or test/unavailable (cath lab)   Duncan Dull 03/24/2016, 9:24 AM Alben Deeds, PT DPT  [phone]

## 2016-03-24 NOTE — Progress Notes (Signed)
Site area: rt groin sheath pulled and pressure held by Alison Murray Site Prior to Removal:  Level 0 Pressure Applied For:  20 minutes Manual:   yes Patient Status During Pull:  stable Post Pull Site:  Level  0 Post Pull Instructions Given:  yes Post Pull Pulses Present: yes Dressing Applied:  tegaderm Bedrest begins @   1130 Comments:

## 2016-03-24 NOTE — Progress Notes (Signed)
Occupational Therapy Treatment Patient Details Name: Kyle [name] MRN: [id number] DOB: 15-May-1941 Today's Date: 03/24/2016    History of present illness 75 y.o. male admitted with acute respiratory failure, CAP, NSTEMI. PMH of DM, BPH, MI, gout, COPD, CKD, CAD.    OT comments  Gave energy conservation handout and began review. Multiple family members arrived and patient to have cath this am. Patient requested to visit with his family. Session terminated at patient's request. OT will continue to follow.  Follow Up Recommendations  No OT follow up    Equipment Recommendations  3 in 1 bedside comode    Recommendations for Other Services      Precautions / Restrictions Precautions Precautions: Fall Restrictions Weight Bearing Restrictions: No       Mobility Bed Mobility                  Transfers                      Balance                                   ADL Overall ADL's : Needs assistance/impaired                                       General ADL Comments: Patient given energy conservation handout and began reviewing techniques with patient. Several family members arrived and patient requested to visit with them. Patient also reports he is supposed to have cath at 9:00. Reiterated planning, prioritization, pacing with patient and session terminated at his request.      Vision                     Perception     Praxis      Cognition   Behavior During Therapy: [facility] for tasks assessed/performed Overall Cognitive Status: Within Functional Limits for tasks assessed                       Extremity/Trunk Assessment               Exercises     Shoulder Instructions       General Comments      Pertinent Vitals/ Pain       Pain Assessment: No/denies pain  Home Living                                          Prior Functioning/Environment              Frequency  Min 2X/week     Progress Toward Goals  OT Goals(current goals can now be found in the care plan section)  Progress towards OT goals: Progressing toward goals  Acute Rehab OT Goals Patient Stated Goal: return to independence with mobility  Plan Discharge plan remains appropriate    Co-evaluation                 End of Session     Activity Tolerance Patient tolerated treatment well   Patient Left in bed;with call bell/phone within reach;with family/visitor present   Nurse Communication          Time: 0812-0822 OT Time  Calculation (min): 10 min  Charges: OT General Charges $OT Visit: 1 Procedure OT Treatments $Self Care/Home Management : 8-22 mins  Chelbie Jarnagin A 03/24/2016, 8:55 AM

## 2016-03-24 NOTE — H&P (View-Only) (Signed)
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    CURRENT MEDS . acidophilus  1 capsule Oral Daily  . allopurinol  100 mg Oral Daily  . amLODipine  5 mg Oral Daily  . aspirin EC  81 mg Oral Daily  . azithromycin  250 mg Intravenous Q24H  . carvedilol  25 mg Oral BID WC  . cloNIDine  0.2 mg Transdermal Weekly  . clopidogrel  75 mg Oral Daily  . ferrous sulfate  325 mg Oral Q breakfast  . hydrALAZINE  50 mg Oral Q8H  . insulin aspart  0-9 Units Subcutaneous TID WC  . insulin NPH Human  20 Units Subcutaneous BID AC & HS  . isosorbide mononitrate  60 mg Oral Daily  . loratadine  10 mg Oral Daily  . pantoprazole  40 mg Oral Q1200  . ranolazine  500 mg Oral BID  . rOPINIRole  4 mg Oral QHS  . rosuvastatin  10 mg Oral q1800  . sodium chloride flush  3 mL Intravenous Q12H  . sodium chloride flush  3 mL Intravenous Q12H  . sodium chloride flush  3 mL Intravenous Q12H  . tamsulosin  0.4 mg Oral Daily    OBJECTIVE   Intake/Output Summary (Last 24 hours) at 03/23/16 0934 Last data filed at 03/23/16 0901  Gross per 24 hour  Intake 1110.5 ml  Output   1500 ml  Net -389.5 ml   Filed Weights   03/21/16 0506 03/22/16 0500 03/23/16 0448  Weight: 88.089 kg (194 lb 3.2 oz) 87.952 kg (193 lb  14.4 oz) 88.315 kg (194 lb 11.2 oz)    PHYSICAL EXAM Filed Vitals:   03/22/16 2132 03/23/16 0448 03/23/16 0623 03/23/16 0750  BP: 129/53 129/89 122/52 126/54  Pulse:  65  67  Temp:  98 F (36.7 C)  97.4 F (36.3 C)  TempSrc:  Oral  Oral  Resp:  16  18  Height:      Weight:  88.315 kg (194 lb 11.2 oz)    SpO2:  96%  94%   General: Alert, oriented x3, no distress Head: no evidence of trauma, PERRL, EOMI, no exophtalmos or lid lag, no myxedema, no xanthelasma; normal ears, nose and oropharynx Neck: normal jugular venous pulsations and no hepatojugular reflux; brisk carotid pulses without delay and no carotid bruits Chest: clear to auscultation, no signs of consolidation by percussion or palpation, normal fremitus, symmetrical and full respiratory excursions Cardiovascular: normal position and quality of the apical impulse, regular rhythm, normal first and second heart sounds, no rubs, +ve loud S4 gallop, no murmur Abdomen: no tenderness or distention, no masses by palpation, no abnormal pulsatility or arterial bruits, normal bowel sounds, no hepatosplenomegaly Extremities: no clubbing, cyanosis or edema; absent left radial (harvested for CABG), normal right radial  and ulnar pulses; 2+ right femoral, posterior tibial and dorsalis pedis pulses; 2+ left femoral, posterior tibial and dorsalis pedis pulses; no subclavian or femoral bruits Neurological: grossly nonfocal  LABS  CBC  Recent Labs  03/22/16 0446 03/23/16 0453  WBC 7.0 8.3  HGB 8.4* 8.8*  HCT 25.8* 26.0*  MCV 89.3 87.2  PLT 325 0000000   Basic Metabolic Panel  Recent Labs  03/21/16 0949  03/22/16 0446 03/23/16 0453  NA 130*  < > 130* 132*  K 4.0  < > 3.9 3.9  CL 99*  < > 99* 99*  CO2 20*  < > 20* 23  GLUCOSE 339*  < > 274* 160*  BUN 92*  < > 92* 89*  CREATININE 5.36*  < > 5.36* 5.43*  CALCIUM 8.8*  < > 8.7* 8.7*  PHOS 5.2*  --   --   --   < > = values in this interval not displayed. Liver Function  Tests  Recent Labs  03/21/16 0949  ALBUMIN 2.5*    Radiology Studies Imaging results have been reviewed and No results found.  TELE NSR  ECG NSR, old inferior MI, prominent inferolateral ST-T changes  ASSESSMENT AND PLAN  1. CAD s/p CABG and acute NSTEMI :for cath today. Decide on revascularization strategy keeping renal function in mind. Left radial has been harvested and he needs future AV fistula on right. Femoral access preferred..  2. Chronic kidney disease. His creatinine is above 5. Odds are very high he will need dialysis and he understands this. Nephrology is following.  3. COPD  4. Hyperlipidemia: On Crestor 10 mg a day   5. Paroxysmal atrial fib: He remains in normal sinus rhythm.  6. Chronic systolic heart failure: he appears euvolemic clinically  7. DM with renal and vascular complications, on insulin   Sanda Klein, MD, [facility] [phone] office [phone] pager 03/23/2016 9:34 AM

## 2016-03-24 NOTE — Progress Notes (Signed)
ANTICOAGULATION CONSULT NOTE - Follow Up Consult  Pharmacy Consult for Heparin (apixaban on hold) Indication: chest pain/ACS, afib  Allergies  Allergen Reactions  . Statins Other (See Comments)    Pt has tried 4-5 diff kinds. 03/20/2016 Pt reports no reaction or allergy, but has tried different types of statins to figure out which works best for him.     Patient Measurements: Height: 5\' 8"  (172.7 cm) Weight: 194 lb 12.8 oz (88.361 kg) IBW/kg (Calculated) : 68.4  Vital Signs: Temp: 97.7 F (36.5 C) (07/11 0735) Temp Source: Oral (07/11 0735) BP: 118/65 mmHg (07/11 0735) Pulse Rate: 68 (07/11 0735)  Labs:  Recent Labs  03/21/16 2337  03/22/16 0446 03/23/16 0453 03/23/16 1513 03/24/16 0551  HGB  --   < > 8.4* 8.8*  --  8.6*  HCT  --   --  25.8* 26.0*  --  25.9*  PLT  --   --  325 318  --  342  APTT 93*  --  95* 108*  --   --   HEPARINUNFRC  --   < > 0.82* 0.89* 0.56 0.52  CREATININE  --   --  5.36* 5.43*  --  5.07*  < > = values in this interval not displayed.  Estimated Creatinine Clearance: 13.4 mL/min (by C-G formula based on Cr of 5.07).  Assessment: Tx from [place], NSTEMI in setting of SOB/renal failure. On Eliquis 2.5mg  BID PTA. Last dose 7/2 AM. Started on heparin at [facility] for NSTEMI. To continue heparin for now. HL this am is therapeutic at 0.52. Hgb low but stable at 8.6, plts wnl. No s/s bleeding. Plan for cath lab today  Goal of Therapy:  Heparin level 0.3-0.7 units/ml aPTT 66-102 seconds Monitor platelets by anticoagulation protocol: Yes   Plan:  Continue heparin infusion at 1200 units/hr Check anti-Xa level daily while on heparin Continue to monitor H&H and platelets   Thank you for allowing [place] to participate in this patients care. Jens Som, PharmD Pager: [phone]

## 2016-03-24 NOTE — Interval H&P Note (Signed)
Cath Lab Visit (complete for each Cath Lab visit)  Clinical Evaluation Leading to the Procedure:   ACS: Yes.    Non-ACS:    Anginal Classification: CCS III  Anti-ischemic medical therapy: Maximal Therapy (2 or more classes of medications)  Non-Invasive Test Results: No non-invasive testing performed  Prior CABG: Previous CABG      History and Physical Interval Note:  03/24/2016 9:43 AM  Kyle [name]  has presented today for surgery, with the diagnosis of History CAD, SOB  The various methods of treatment have been discussed with the patient and family. After consideration of risks, benefits and other options for treatment, the patient has consented to  Procedure(s): Right/Left Heart Cath and Coronary/Graft Angiography (N/A) as a surgical intervention .  The patient's history has been reviewed, patient examined, no change in status, stable for surgery.  I have reviewed the patient's chart and labs.  Questions were answered to the patient's satisfaction.     Shelva Majestic

## 2016-03-24 NOTE — Progress Notes (Signed)
Updated report received via [place] RN in patient's room using SBAR format, reviewed events of the day, assumed care of patient.

## 2016-03-24 NOTE — Progress Notes (Signed)
Admit: 03/19/2016 LOS: 5  1M AoCKD (vs CKD Progression), NSTEMI, AoC CHF Exacerbation  Subjective:  LHC/RHC today -- wife reports no PCI Lasix gtt on hold GFR stable / slightly improved  07/10 0701 - 07/11 0700 In: 721.9 [P.O.:240; I.V.:481.9] Out: 800 [Urine:800]  Filed Weights   03/22/16 0500 03/23/16 0448 03/24/16 0553  Weight: 87.952 kg (193 lb 14.4 oz) 88.315 kg (194 lb 11.2 oz) 88.361 kg (194 lb 12.8 oz)    Scheduled Meds: . acidophilus  1 capsule Oral Daily  . allopurinol  100 mg Oral Daily  . amLODipine  5 mg Oral Daily  . aspirin EC  81 mg Oral Daily  . carvedilol  25 mg Oral BID WC  . cloNIDine  0.2 mg Transdermal Weekly  . clopidogrel  75 mg Oral Daily  . ferrous sulfate  325 mg Oral Q breakfast  . hydrALAZINE  50 mg Oral Q8H  . insulin aspart  0-9 Units Subcutaneous TID WC  . insulin NPH Human  20 Units Subcutaneous BID AC & HS  . isosorbide mononitrate  60 mg Oral Daily  . loratadine  10 mg Oral Daily  . pantoprazole  40 mg Oral Q1200  . ranolazine  500 mg Oral BID  . rOPINIRole  4 mg Oral QHS  . rosuvastatin  10 mg Oral q1800  . sodium chloride flush  3 mL Intravenous Q12H  . sodium chloride flush  3 mL Intravenous Q12H  . tamsulosin  0.4 mg Oral Daily   Continuous Infusions: . sodium chloride 75 mL/hr at 03/24/16 1108  . heparin Stopped (03/24/16 1030)   PRN Meds:.sodium chloride, acetaminophen **OR** acetaminophen, acetaminophen, albuterol, diazepam, hydrALAZINE, nitroGLYCERIN, ondansetron **OR** ondansetron (ZOFRAN) IV, ondansetron (ZOFRAN) IV, oxyCODONE, sodium chloride flush  Current Labs: reviewed AM labs pending   Physical Exam:  Blood pressure 130/74, pulse 63, temperature 97.5 F (36.4 C), temperature source Oral, resp. rate 63, height 5\' 8"  (1.727 m), weight 88.361 kg (194 lb 12.8 oz), SpO2 94 %. IRIR, no rub. Nl s1s2 Diminished in bases Trace LEE No rashes/lesions NAD, nl WOB No c/c/e  A 1. AoCKD4 1. Looks like has had progressive  inc in SCr over past months, had f/u appt at [place] in near future 2. Suspect current issues related to CHF/NSTEMI related cardiorenal 3. Obstruction excluded by renal US 7/7 4. Nearing dialysis but not absolute indication currently 2. NSTEMI needing LHC/RHC 3. ?systolic CHF exacerbation -- +Pleural effusions and edema 4. CAP on Azithro, defervesced 5. HTN 6. COPD  Plan 1. No IVFs post cath, do not restart diuertics currnetly 2. BMP in AM 3. Daily weights, Daily Renal Panel, Strict I/Os, Avoid nephrotoxins (NSAIDs, judicious IV Contrast)   Pearson Grippe MD 03/24/2016, 1:17 PM   Recent Labs Lab 03/21/16 0949  03/22/16 0446 03/23/16 0453 03/24/16 0551  NA 130*  < > 130* 132* 132*  K 4.0  < > 3.9 3.9 3.8  CL 99*  < > 99* 99* 101  CO2 20*  < > 20* 23 21*  GLUCOSE 339*  < > 274* 160* 190*  BUN 92*  < > 92* 89* 84*  CREATININE 5.36*  < > 5.36* 5.43* 5.07*  CALCIUM 8.8*  < > 8.7* 8.7* 8.7*  PHOS 5.2*  --   --   --  5.0*  < > = values in this interval not displayed.  Recent Labs Lab 03/22/16 0446 03/23/16 0453 03/24/16 0551  WBC 7.0 8.3 8.9  HGB 8.4* 8.8* 8.6*  HCT  25.8* 26.0* 25.9*  MCV 89.3 87.2 88.7  PLT 325 318 342

## 2016-03-25 ENCOUNTER — Encounter (HOSPITAL_COMMUNITY): Payer: Self-pay | Admitting: Cardiovascular Disease

## 2016-03-25 DIAGNOSIS — I25700 Atherosclerosis of coronary artery bypass graft(s), unspecified, with unstable angina pectoris: Secondary | ICD-10-CM | POA: Diagnosis not present

## 2016-03-25 DIAGNOSIS — I481 Persistent atrial fibrillation: Secondary | ICD-10-CM | POA: Diagnosis not present

## 2016-03-25 DIAGNOSIS — I214 Non-ST elevation (NSTEMI) myocardial infarction: Secondary | ICD-10-CM | POA: Diagnosis not present

## 2016-03-25 DIAGNOSIS — E8779 Other fluid overload: Secondary | ICD-10-CM

## 2016-03-25 DIAGNOSIS — N179 Acute kidney failure, unspecified: Secondary | ICD-10-CM | POA: Diagnosis not present

## 2016-03-25 DIAGNOSIS — I2510 Atherosclerotic heart disease of native coronary artery without angina pectoris: Secondary | ICD-10-CM

## 2016-03-25 DIAGNOSIS — I129 Hypertensive chronic kidney disease with stage 1 through stage 4 chronic kidney disease, or unspecified chronic kidney disease: Secondary | ICD-10-CM | POA: Diagnosis not present

## 2016-03-25 DIAGNOSIS — J449 Chronic obstructive pulmonary disease, unspecified: Secondary | ICD-10-CM | POA: Diagnosis not present

## 2016-03-25 DIAGNOSIS — I480 Paroxysmal atrial fibrillation: Secondary | ICD-10-CM | POA: Diagnosis not present

## 2016-03-25 DIAGNOSIS — I482 Chronic atrial fibrillation: Secondary | ICD-10-CM | POA: Diagnosis not present

## 2016-03-25 DIAGNOSIS — N184 Chronic kidney disease, stage 4 (severe): Secondary | ICD-10-CM | POA: Diagnosis not present

## 2016-03-25 DIAGNOSIS — J9601 Acute respiratory failure with hypoxia: Secondary | ICD-10-CM | POA: Diagnosis not present

## 2016-03-25 DIAGNOSIS — I5022 Chronic systolic (congestive) heart failure: Secondary | ICD-10-CM | POA: Diagnosis not present

## 2016-03-25 LAB — GLUCOSE, CAPILLARY
GLUCOSE-CAPILLARY: 111 mg/dL — AB (ref 65–99)
GLUCOSE-CAPILLARY: 133 mg/dL — AB (ref 65–99)
Glucose-Capillary: 83 mg/dL (ref 65–99)
Glucose-Capillary: 125 mg/dL — ABNORMAL HIGH (ref 65–99)

## 2016-03-25 LAB — RENAL FUNCTION PANEL
Albumin: 2.9 g/dL — ABNORMAL LOW (ref 3.5–5.0)
Anion gap: 11 (ref 5–15)
BUN: 78 mg/dL — AB (ref 6–20)
CHLORIDE: 104 mmol/L (ref 101–111)
CO2: 22 mmol/L (ref 22–32)
CREATININE: 4.75 mg/dL — AB (ref 0.61–1.24)
Calcium: 9 mg/dL (ref 8.9–10.3)
GFR calc Af Amer: 13 mL/min — ABNORMAL LOW (ref >60)
GFR calc non Af Amer: 11 mL/min — ABNORMAL LOW (ref >60)
Glucose, Bld: 87 mg/dL (ref 65–99)
PHOSPHORUS: 5.1 mg/dL — AB (ref 2.5–4.6)
POTASSIUM: 3.8 mmol/L (ref 3.5–5.1)
Sodium: 137 mmol/L (ref 135–145)

## 2016-03-25 LAB — CBC
HCT: 26.9 % — ABNORMAL LOW (ref 39.0–52.0)
Hemoglobin: 8.9 g/dL — ABNORMAL LOW (ref 13.0–17.0)
MCH: 29.5 pg (ref 26.0–34.0)
MCHC: 33.1 g/dL (ref 30.0–36.0)
MCV: 89.1 fL (ref 78.0–100.0)
PLATELETS: 348 10*3/uL (ref 150–400)
RBC: 3.02 MIL/uL — AB (ref 4.22–5.81)
RDW: 17.3 % — AB (ref 11.5–15.5)
WBC: 10.2 10*3/uL (ref 4.0–10.5)

## 2016-03-25 MED ORDER — HEPARIN BOLUS VIA INFUSION
2000.0000 [IU] | Freq: Once | INTRAVENOUS | Status: DC
  Filled 2016-03-25: qty 2000

## 2016-03-25 MED ORDER — APIXABAN 2.5 MG PO TABS
2.5000 mg | ORAL_TABLET | Freq: Two times a day (BID) | ORAL | Status: DC
  Administered 2016-03-25 – 2016-03-26 (×3): 2.5 mg via ORAL
  Filled 2016-03-25 (×3): qty 1

## 2016-03-25 MED ORDER — HEPARIN (PORCINE) IN NACL 100-0.45 UNIT/ML-% IJ SOLN
1200.0000 [IU]/h | INTRAMUSCULAR | Status: DC
  Filled 2016-03-25: qty 250

## 2016-03-25 NOTE — Progress Notes (Signed)
Occupational Therapy Treatment Patient Details Name: Kyle [name] MRN: [id number] DOB: 08/24/1941 Today's Date: 03/25/2016    History of present illness 75 y.o. male admitted with acute respiratory failure, CAP, NSTEMI. PMH of DM, BPH, MI, gout, COPD, CKD, CAD.    OT comments  Pt ambulated @ 140 ft on RA with RW - desat to 88 briefly and dyspnea 2/4. Rebounds quickly to 94 once sitting. Educated pt/wife on energy conservation and activity modification to maximize independence with ADL and reduce risk of falls. Pt/wife verbalized understanding.   Follow Up Recommendations  No OT follow up    Equipment Recommendations  3 in 1 bedside comode    Recommendations for Other Services      Precautions / Restrictions Precautions Precautions: Fall Restrictions Weight Bearing Restrictions: No       Mobility Bed Mobility Overal bed mobility: Needs Assistance Bed Mobility: Supine to Sit     Supine to sit: Min assist     General bed mobility comments: Wife assisting pt. Pt has an adjustable bed at home  Transfers Overall transfer level: Needs assistance Equipment used: Rolling walker (2 wheeled) Transfers: Sit to/from Stand Sit to Stand: Supervision         General transfer comment: vc for hand placement    Balance Overall balance assessment: Needs assistance           Standing balance-Leahy Scale: Fair                     ADL                                         General ADL Comments: REviewed energy conservation techniques withpt/wife. Pt able to ambulate @ 140 ft @ RW level. Educated on home set up to reduce risk of falls nad maximize level of independende. REcommended pt use showerchair. Family states it is being delivered to their home.      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    Pertinent Vitals/ Pain       Pain Assessment: No/denies pain  Home Living                                          Prior Functioning/Environment              Frequency Min 2X/week     Progress Toward Goals  OT Goals(current goals can now be found in the care plan section)  Progress towards OT goals: Progressing toward goals  Acute Rehab OT Goals Patient Stated Goal: return to independence with mobility OT Goal Formulation: With patient Time For Goal  Achievement: 03/27/16 Potential to Achieve Goals: Good ADL Goals Pt Will Transfer to Toilet: with modified independence;ambulating;bedside commode Pt Will Perform Toileting - Clothing Manipulation and hygiene: with modified independence;sit to/from stand Pt Will Perform Tub/Shower Transfer: Shower transfer;with supervision;ambulating;3 in 1;rolling walker;grab bars Additional ADL Goal #1: Pt will verbalize at least 3 energy conservation strategies. Additional ADL Goal #2: Pt will gather ADL items from around room with RW at modified independent level.  Plan Discharge plan remains appropriate    Co-evaluation                 End of Session Equipment Utilized During Treatment: Gait belt;Rolling walker   Activity Tolerance Patient tolerated treatment well   Patient Left in chair;with call bell/phone within reach;with family/visitor present   Nurse Communication Mobility status        Time: [id number] OT Time Calculation (min): 23 min  Charges: OT General Charges $OT Visit: 1 Procedure OT Treatments $Self Care/Home Management : 23-37 mins  Furman Trentman,HILLARY 03/25/2016, 10:50 AM   Maurie Boettcher, OTR/L  [phone] 03/25/2016

## 2016-03-25 NOTE — Progress Notes (Signed)
Patient ID: Kyle [name], male   DOB: [birth year], 75 y.o.   MRN: [id number]   PROGRESS NOTE    MARKANDREW SCHNICK  [id number] DOB: 1941-06-15 DOA: 03/19/2016  PCP: Ocie Doyne, MD   Brief Narrative:  75 y.o. male with hypertension, hyperlipidemia, diabetes mellitus, COPD, GERD, gout, depression, anxiety, CAD, s/p of CABG, congestive heart failure, CKD-IV, BPH, anemia, remote history of alcoholism, atrial fibrillation on Eliquis, presented from [facility] for further evaluation of progressive dyspnea with exertion and occasionally present at rest. Pt presented to [facility] 7/3 and at that time had fevers up to 101.53F and oxygen saturations in 70's. He was treated with IV Rocephin, Zithromax, Zosyn, IV lasix. His troponins were elevated and continued to increase 2.8>>5.3>> 6.6>>6.0, consistent with NSTEMI. Cardiology was consulted, patient was treated with heparin drip. Of note, pt has CKD-IV, and was seen by renal in the past, HD recommended even 3 years ago, but patient refused dialysis in the past.   Assessment & Plan:   Principal Problem:   Acute respiratory failure with hypoxia ([place]) - secondary to acute systolic CHF, ? CAP, AoCKD4 - was on lasix drip, now on hold post cath  - pt currently off oxygen, comfortable, maintaining oxygen saturation at target range  - please see cath results below, continue with medical therapy for now, no plans for further cardiac interventions   Active Problems:   NSTEMI, acute systolic CHF - cath results below, I have discussed the findings with pt and family at bedside  - appreciate cardio team following  - no further recommendations from cardiac standpoint     Acute on chronic kidney disease stage IV - with progressive increase in Cr in the past few months  - now Cr up likely from cardiorenal etiology, NSTEMI, Cr slightly better this AM from 5.43 --> 5.07 --> 4.75 - nephrology team following, appreciate assistance  - renal US  unremarkable  - weight trend in the past 72 hours: 193 --> 194 --> 199 lbs - continue to monitor strict I/O, daily weights  - plan to start diuretic in AM    Atrial fibrillation ([place]), chronic, CHADS2 vasc score 4-5 - on Eliquis at home, holding for now as cath planned  - continue Heparin drip     Essential hypertension - reasonable inpatient control  - on Hydralazine, Imdur, Coreg, Clonidine     Hyponatremia - resolved  - BMP in AM     DM due to underlying condition with diabetic chronic kidney disease ([place]) - continue insulin NPH as well as SSI - adjust dose of Insulin as clinically indicated     Anemia of chronic disease, kidney disease - no sings of bleeding - CBC In AM    ? Bilateral PNA, unknown pathogen - completed 5 days of Zithromax  - no fevers and no leukocytosis     Obesity - Body mass index is 29.84 kg/(m^2).   DVT prophylaxis: on Heparin drip  Code Status: Partial code, no intubation but OK with everything else per ACLS protocol if needed  Family Communication: Patient and family at bedside  Disposition Plan: Home once consultants clear, further recommendations pending cardiac cath   Consultants:   Cardiology  Nephrology  PT/OT  Procedures:   Cardiac cath 7/11 --> Severe native CAD with total occlusion of the LAD after the first septal and diagonal vessel; total occlusion of the circumflex vessel which had diffuse prior stenting; and total occlusion of the proximal to mid RCA. There  is collateralization to the distal RCA/PDA, both via the LIMA-LAD injection and also via the radial artery-intermediate injection. Patent LIMA graft supplying the LAD at the very distal LAD having an 80% stenosis in a small caliber apical segment.  RECOMMENDATION:  Medical therapy with high likelihood of imminent need to initiate dialysis long-term.   Antimicrobials:   Zithromax 7/6 --> 7/10  Subjective: Reports feeling better. No concerns this AM.    Objective: Filed Vitals:   03/25/16 0729 03/25/16 0833 03/25/16 1117 03/25/16 1130  BP: 131/57 129/54 135/69   Pulse: 65 64    Temp: 98.2 F (36.8 C)   97.6 F (36.4 C)  TempSrc: Oral   Oral  Resp:      Height:      Weight:      SpO2: 100%       Intake/Output Summary (Last 24 hours) at 03/25/16 1216 Last data filed at 03/25/16 1130  Gross per 24 hour  Intake 1972.25 ml  Output    850 ml  Net 1122.25 ml   Filed Weights   03/23/16 0448 03/24/16 0553 03/25/16 0438  Weight: 88.315 kg (194 lb 11.2 oz) 88.361 kg (194 lb 12.8 oz) 90.266 kg (199 lb)    Examination:  General exam: Appears calm and comfortable  Respiratory system: Respiratory effort normal. Diminished breath sounds at bases  Cardiovascular system: RRR. No JVD, rubs, gallops or clicks.Trace bilateral LE edema and upper ext edema  Gastrointestinal system: Abdomen is slightly distended, soft and nontender.  Central nervous system: Alert and oriented. No focal neurological deficits. Extremities: Symmetric 5 x 5 power. Skin: No rashes, lesions or ulcers Psychiatry: Judgement and insight appear normal. Mood & affect appropriate.   Data Reviewed: I have personally reviewed following labs and imaging studies  CBC:  Recent Labs Lab 03/21/16 0415 03/22/16 0446 03/23/16 0453 03/24/16 0551 03/25/16 0452  WBC 6.3 7.0 8.3 8.9 10.2  HGB 8.6* 8.4* 8.8* 8.6* 8.9*  HCT 26.7* 25.8* 26.0* 25.9* 26.9*  MCV 88.4 89.3 87.2 88.7 89.1  PLT 310 325 318 342 0000000   Basic Metabolic Panel:  Recent Labs Lab 03/21/16 0949 03/21/16 1603 03/22/16 0446 03/23/16 0453 03/24/16 0551 03/25/16 0452  NA 130* 131* 130* 132* 132* 137  K 4.0 4.0 3.9 3.9 3.8 3.8  CL 99* 100* 99* 99* 101 104  CO2 20* 20* 20* 23 21* 22  GLUCOSE 339* 306* 274* 160* 190* 87  BUN 92* 93* 92* 89* 84* 78*  CREATININE 5.36* 5.45* 5.36* 5.43* 5.07* 4.75*  CALCIUM 8.8* 8.7* 8.7* 8.7* 8.7* 9.0  PHOS 5.2*  --   --   --  5.0* 5.1*   Coagulation  Profile:  Recent Labs Lab 03/19/16 2114  INR 1.30   Cardiac Enzymes:  Recent Labs Lab 03/19/16 2114 03/20/16 0308 03/20/16 0807  TROPONINI 0.94* 0.66* 0.60*   CBG:  Recent Labs Lab 03/24/16 1141 03/24/16 1651 03/24/16 2050 03/25/16 0727 03/25/16 1132  GLUCAP 98 89 168* 83 111*   Urine analysis:    Component Value Date/Time   COLORURINE YELLOW 03/20/2016 1354   APPEARANCEUR CLOUDY* 03/20/2016 1354   LABSPEC 1.020 03/20/2016 1354   PHURINE 5.0 03/20/2016 1354   GLUCOSEU 250* 03/20/2016 1354   HGBUR NEGATIVE 03/20/2016 1354   BILIRUBINUR NEGATIVE 03/20/2016 1354   KETONESUR NEGATIVE 03/20/2016 1354   PROTEINUR 100* 03/20/2016 1354   NITRITE NEGATIVE 03/20/2016 1354   LEUKOCYTESUR MODERATE* 03/20/2016 1354   Radiology Studies: No results found.    Scheduled Meds: .  acidophilus  1 capsule Oral Daily  . allopurinol  100 mg Oral Daily  . amLODipine  5 mg Oral Daily  . aspirin EC  81 mg Oral Daily  . carvedilol  25 mg Oral BID WC  . cloNIDine  0.2 mg Transdermal Weekly  . clopidogrel  75 mg Oral Daily  . ferrous sulfate  325 mg Oral Q breakfast  . hydrALAZINE  50 mg Oral Q8H  . insulin aspart  0-9 Units Subcutaneous TID WC  . insulin NPH Human  20 Units Subcutaneous BID AC & HS  . isosorbide mononitrate  60 mg Oral Daily  . loratadine  10 mg Oral Daily  . pantoprazole  40 mg Oral Q1200  . ranolazine  500 mg Oral BID  . rOPINIRole  4 mg Oral QHS  . rosuvastatin  10 mg Oral q1800  . sodium chloride flush  3 mL Intravenous Q12H  . sodium chloride flush  3 mL Intravenous Q12H  . tamsulosin  0.4 mg Oral Daily   Continuous Infusions: . sodium chloride Stopped (03/25/16 0509)     LOS: 6 days    Time spent: 20 minutes    Faye Ramsay, MD [facility] Pager [phone]  If 7PM-7AM, please contact night-coverage [email or web address] Password [facility] 03/25/2016, 12:16 PM

## 2016-03-25 NOTE — Progress Notes (Signed)
Physical Therapy Treatment Patient Details Name: Kyle [name] MRN: [id number] DOB: 1940-09-24 Today's Date: 03/25/2016    History of Present Illness 75 y.o. male admitted with acute respiratory failure, CAP, NSTEMI. PMH of DM, BPH, MI, gout, COPD, CKD, CAD.     PT Comments    Kyle [name] is making good progress but requires cues for pursed lip breathing as SpO2 dropped to 88% x1 while ambulating on RA and pt required one standing rest break.  Cues for pursed lip breathing and SpO2 up to 96%. Recommending Cardiopulmonary Rehab at d/c to continue progress with energy conservation techniques and ambulatory endurance.   Follow Up Recommendations  Supervision for mobility/OOB;Other (comment) (Cardiopulmonary Rehab)     Equipment Recommendations  Other (comment) (rollator for energy conservation)    Recommendations for Other Services       Precautions / Restrictions Precautions Precautions: Fall Restrictions Weight Bearing Restrictions: No    Mobility  Bed Mobility Overal bed mobility: Needs Assistance;Modified Independent Bed Mobility: Sit to Supine       Sit to supine: Modified independent (Device/Increase time)   General bed mobility comments: No assist or cues needed.  Transfers Overall transfer level: Needs assistance Equipment used: Rolling walker (2 wheeled) Transfers: Sit to/from Stand Sit to Stand: Min assist         General transfer comment: VCs for hand placement, min assist to power to standing from low chair height.  Ambulation/Gait Ambulation/Gait assistance: Supervision Ambulation Distance (Feet): 125 Feet Assistive device: Rolling walker (2 wheeled) Gait Pattern/deviations: Step-through pattern;Decreased stride length Gait velocity: decreased   General Gait Details: VCs for upright posture and for forward gaze. SpO2 dropped to 88% x1 while ambulating on RA and required one standing rest break.  Cues for pursed lip breathing and SpO2 up to  96%.   Stairs            Wheelchair Mobility    Modified Rankin (Stroke Patients Only)       Balance Overall balance assessment: Needs assistance Sitting-balance support: No upper extremity supported;Feet supported Sitting balance-Leahy Scale: Good     Standing balance support: No upper extremity supported;During functional activity Standing balance-Leahy Scale: Fair                      Cognition Arousal/Alertness: Awake/alert Behavior During Therapy: WFL for tasks assessed/performed Overall Cognitive Status: Within Functional Limits for tasks assessed                      Exercises Other Exercises Other Exercises: Pursed lip breathing during session and encouraged pt to continue throughout the day.  Additionally encouraged pt to walk around house every hour with wife upon d/c.    General Comments        Pertinent Vitals/Pain Pain Assessment: No/denies pain    Home Living                      Prior Function            PT Goals (current goals can now be found in the care plan section) Acute Rehab PT Goals Patient Stated Goal: return to independence with mobility PT Goal Formulation: With patient/family Time For Goal Achievement: 04/03/16 Potential to Achieve Goals: Good Progress towards PT goals: Progressing toward goals    Frequency  Min 3X/week    PT Plan Discharge plan needs to be updated    Co-evaluation  End of Session Equipment Utilized During Treatment: Gait belt Activity Tolerance: Patient limited by fatigue Patient left: in bed;with call bell/phone within reach;with family/visitor present;with bed alarm set     Time: 1400-1419 PT Time Calculation (min) (ACUTE ONLY): 19 min  Charges:  $Gait Training: 8-22 mins                    G Codes:       Collie Siad PT, DPT  Pager: [phone] Phone: [phone] 03/25/2016, 2:59 PM

## 2016-03-25 NOTE — Progress Notes (Signed)
Admit: 03/19/2016 LOS: 6  74M AoCKD (vs CKD Progression), NSTEMI, AoC CHF Exacerbation  Subjective:  SCr improved today, adequate UOP No new events  07/11 0701 - 07/12 0700 In: 1438.3 [P.O.:240; I.V.:1198.3] Out: 550 [Urine:550]  Filed Weights   03/23/16 0448 03/24/16 0553 03/25/16 0438  Weight: 88.315 kg (194 lb 11.2 oz) 88.361 kg (194 lb 12.8 oz) 90.266 kg (199 lb)    Scheduled Meds: . acidophilus  1 capsule Oral Daily  . allopurinol  100 mg Oral Daily  . amLODipine  5 mg Oral Daily  . aspirin EC  81 mg Oral Daily  . carvedilol  25 mg Oral BID WC  . cloNIDine  0.2 mg Transdermal Weekly  . clopidogrel  75 mg Oral Daily  . ferrous sulfate  325 mg Oral Q breakfast  . hydrALAZINE  50 mg Oral Q8H  . insulin aspart  0-9 Units Subcutaneous TID WC  . insulin NPH Human  20 Units Subcutaneous BID AC & HS  . isosorbide mononitrate  60 mg Oral Daily  . loratadine  10 mg Oral Daily  . pantoprazole  40 mg Oral Q1200  . ranolazine  500 mg Oral BID  . rOPINIRole  4 mg Oral QHS  . rosuvastatin  10 mg Oral q1800  . sodium chloride flush  3 mL Intravenous Q12H  . sodium chloride flush  3 mL Intravenous Q12H  . tamsulosin  0.4 mg Oral Daily   Continuous Infusions: . sodium chloride Stopped (03/25/16 0509)   PRN Meds:.sodium chloride, acetaminophen **OR** acetaminophen, acetaminophen, albuterol, diazepam, hydrALAZINE, nitroGLYCERIN, ondansetron **OR** ondansetron (ZOFRAN) IV, ondansetron (ZOFRAN) IV, oxyCODONE, sodium chloride flush  Current Labs: reviewed AM labs pending   Physical Exam:  Blood pressure 129/54, pulse 64, temperature 98.2 F (36.8 C), temperature source Oral, resp. rate 15, height 5\' 8"  (1.727 m), weight 90.266 kg (199 lb), SpO2 100 %. IRIR, no rub. Nl s1s2 Diminished in bases Trace LEE No rashes/lesions NAD, nl WOB No c/c/e  A 1. AoCKD4 1. Looks like has had progressive inc in SCr over past months, had f/u appt at [place] in near future 2. Suspect current issues  related to CHF/NSTEMI related cardiorenal 3. Obstruction excluded by renal US 7/7 4. Nearing dialysis but not absolute indication currently with stable but low GFR 2. NSTEMI needing LHC/RHC -- 7/11 w/o PCI 3. ?systolic CHF exacerbation -- responded somewhat to lasix gtt, now on hold post LHC 4. CAP s/p Azithro, defervesced 5. HTN 6. COPD  1. Plan 1. Pan to resume PO diuretics tomorrow 2. Will need close [facility] f/u 2. BMP in AM 3. Daily weights, Daily Renal Panel, Strict I/Os, Avoid nephrotoxins (NSAIDs, judicious IV Contrast)   Pearson Grippe MD 03/25/2016, 9:55 AM   Recent Labs Lab 03/21/16 0949  03/23/16 0453 03/24/16 0551 03/25/16 0452  NA 130*  < > 132* 132* 137  K 4.0  < > 3.9 3.8 3.8  CL 99*  < > 99* 101 104  CO2 20*  < > 23 21* 22  GLUCOSE 339*  < > 160* 190* 87  BUN 92*  < > 89* 84* 78*  CREATININE 5.36*  < > 5.43* 5.07* 4.75*  CALCIUM 8.8*  < > 8.7* 8.7* 9.0  PHOS 5.2*  --   --  5.0* 5.1*  < > = values in this interval not displayed.  Recent Labs Lab 03/23/16 0453 03/24/16 0551 03/25/16 0452  WBC 8.3 8.9 10.2  HGB 8.8* 8.6* 8.9*  HCT 26.0* 25.9* 26.9*  MCV 87.2 88.7 89.1  PLT 318 342 348

## 2016-03-25 NOTE — Care Management Note (Addendum)
Case Management Note  Patient Details  Name: Kyle [name] MRN: [id number] Date of Birth: 1941/03/18  Subjective/Objective:  Pt presented for Acute Resp Failure with Hypoxia. Pt with increased troponin-post cath 03-24-16 plan for medical management. Renal is following BUN/Cr. Pt will need HD eventually per notes.                   Action/Plan: Recommendations for RW. CM will speak with pt in regards to DME. Will continue to monitor.   Expected Discharge Date:                  Expected Discharge Plan:  Home/Self Care  In-House Referral:  NA  Discharge planning Services  CM Consult  Post Acute Care Choice:  Durable Medical Equipment Choice offered to:  Patient  DME Arranged:  Walker rolling DME Agency:   [place] Arranged:   N/A HH Agency:   N/A  Status of Service:  Completed.  If discussed at [place] of Stay Meetings, dates discussed:    Additional Comments: 1234 03-26-16 Jacqlyn Krauss, RN,BSN [phone]  DME to be delivered via [facility]-per pt choice. Pt will not need HH Services. Pt will do cardiopulmonary rehab. No further needs from CM at this time.  Bethena Roys, RN 03/25/2016, 5:11 PM

## 2016-03-25 NOTE — Progress Notes (Signed)
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 Possibly mild dyspnea walking in the hallway. Cath shows no options for revascularization. All native arteries are occluded, as is the SVG to RCA. Patent LIMA to LAD and SVG to OM (but ostial OM occlusion so that there is no retrograde flow to the rest of the LCX). Increased filling pressures, preserved cardiac outputs. Maintaining good UO and creat actually lower than pre-cath.  CURRENT MEDS . acidophilus  1 capsule Oral Daily  . allopurinol  100 mg Oral Daily  . amLODipine  5 mg Oral Daily  . aspirin EC  81 mg Oral Daily  . carvedilol  25 mg Oral BID WC  . cloNIDine  0.2 mg Transdermal Weekly  . clopidogrel  75 mg Oral Daily  . ferrous sulfate  325 mg Oral Q breakfast  . hydrALAZINE  50 mg Oral Q8H  . insulin aspart  0-9 Units Subcutaneous TID WC  . insulin NPH Human  20 Units Subcutaneous BID AC & HS  . isosorbide mononitrate  60 mg Oral Daily  . loratadine  10 mg Oral Daily  . pantoprazole  40 mg Oral Q1200  . ranolazine  500 mg Oral BID  . rOPINIRole  4 mg Oral QHS  . rosuvastatin  10 mg Oral q1800  . sodium chloride flush  3 mL Intravenous Q12H  . sodium chloride flush  3 mL Intravenous Q12H  . tamsulosin  0.4 mg Oral Daily     OBJECTIVE   Intake/Output Summary (Last 24 hours) at 03/25/16 1335 Last data filed at 03/25/16 1130  Gross per 24 hour  Intake 1972.25 ml  Output    850 ml  Net 1122.25 ml   Filed Weights   03/23/16 0448 03/24/16 0553 03/25/16 0438  Weight: 88.315 kg (194 lb 11.2 oz) 88.361 kg (194 lb 12.8 oz) 90.266 kg (199 lb)    PHYSICAL EXAM Filed Vitals:   03/25/16 0729 03/25/16 0833 03/25/16 1117 03/25/16 1130  BP: 131/57 129/54 135/69   Pulse: 65 64    Temp: 98.2 F (36.8 C)   97.6 F (36.4 C)  TempSrc: Oral   Oral  Resp:      Height:      Weight:      SpO2: 100%      General: Alert, oriented x3, no distress Head: no evidence of trauma, PERRL, EOMI, no exophtalmos or lid lag, no myxedema, no xanthelasma; normal ears, nose and oropharynx Neck: normal jugular venous pulsations and no hepatojugular reflux; brisk carotid pulses without delay and no carotid bruits Chest: clear to auscultation, no signs of consolidation by percussion or palpation, normal fremitus, symmetrical and full respiratory excursions Cardiovascular: normal position and quality of the apical impulse, regular rhythm, normal first and second heart sounds, S4 gallop, no  murmur Abdomen: no tenderness or distention, no masses by palpation, no abnormal pulsatility or arterial bruits, normal bowel sounds, no hepatosplenomegaly Extremities: no clubbing, cyanosis or edema; 2+ radial, ulnar and brachial pulses bilaterally; 2+ right femoral, posterior tibial and dorsalis pedis pulses; 2+ left femoral, posterior tibial and dorsalis pedis pulses; no subclavian or femoral bruits Neurological: grossly nonfocal  LABS  CBC  Recent Labs  03/24/16 0551 03/25/16 0452  WBC 8.9 10.2  HGB 8.6* 8.9*  HCT 25.9* 26.9*  MCV 88.7 89.1  PLT 342 0000000   Basic Metabolic Panel  Recent Labs  03/24/16 0551 03/25/16 0452  NA 132* 137  K 3.8 3.8  CL 101 104  CO2 21* 22  GLUCOSE 190* 87  BUN 84* 78*  CREATININE 5.07* 4.75*   CALCIUM 8.7* 9.0  PHOS 5.0* 5.1*   Liver Function Tests  Recent Labs  03/24/16 0551 03/25/16 0452  ALBUMIN 2.7* 2.9*    Radiology Studies Imaging results have been reviewed and No results found.  TELE NSR  CATH 03/24/2016 Coronary Diagrams    Diagnostic Diagram            Hemo Data       Most Recent Value   Fick Cardiac Output  7.18 L/min   Fick Cardiac Output Index  3.55 (L/min)/BSA   Thermal Cardiac Output  6.12 L/min   Thermal Cardiac Output Index  3.03 (L/min)/BSA   RA A Wave  12 mmHg   RA V Wave  13 mmHg   RA Mean  11 mmHg   RV Systolic Pressure  47 mmHg   RV Diastolic Pressure  7 mmHg   RV EDP  15 mmHg   PA Systolic Pressure  48 mmHg   PA Diastolic Pressure  14 mmHg   PA Mean  27 mmHg   PW A Wave  20 mmHg   PW V Wave  48 mmHg   PW Mean  27 mmHg   AO Systolic Pressure  99991111 mmHg   AO Diastolic Pressure  43 mmHg   AO Mean  68 mmHg   LV Systolic Pressure  123456 mmHg   LV Diastolic Pressure  11 mmHg   LV EDP  20 mmHg       ASSESSMENT AND PLAN   1. CAD s/p CABG and acute NSTEMI :No good options for revascularization , medical therapy. No angina at this time.  2. Chronic kidney disease. His creatinine is around 5. So far no sign of nephrotoxicity, but this may become apparent tomorrow. Keep to monitor. Hold off diuretics until tomorrow. Avoid RAAS inhibitors and other nephrotoxins.  3. COPD  4. Hyperlipidemia: On Crestor 10 mg a day   5. Paroxysmal atrial fib: He remains in normal sinus rhythm.  6. Chronic systolic heart failure: after IV fluids pre-cath, PAWP mean 27 and moderate PA hypertension (with low transpulmonary gradient) and increased RA pressures. Needs diuresis, starting in AM. Weight has increased 5 lb since admission and would target at least 5 lb diuresis.  7. DM with renal and vascular complications, on insulin   Sanda Klein, MD, [facility] [phone] office [phone]  pager 03/25/2016 1:35 PM

## 2016-03-25 NOTE — Progress Notes (Addendum)
ANTICOAGULATION CONSULT NOTE - Follow Up Consult  Pharmacy Consult for Heparin>> apixaban Indication: chest pain/ACS, afib  Allergies  Allergen Reactions  . Statins Other (See Comments)    Pt has tried 4-5 diff kinds. 03/20/2016 Pt reports no reaction or allergy, but has tried different types of statins to figure out which works best for him.     Patient Measurements: Height: 5\' 8"  (172.7 cm) Weight: 199 lb (90.266 kg) IBW/kg (Calculated) : 68.4  Vital Signs: Temp: 97.6 F (36.4 C) (07/12 1130) Temp Source: Oral (07/12 1130) BP: 135/69 mmHg (07/12 1117) Pulse Rate: 64 (07/12 0833)  Labs:  Recent Labs  03/23/16 0453 03/23/16 1513 03/24/16 0551 03/25/16 0452  HGB 8.8*  --  8.6* 8.9*  HCT 26.0*  --  25.9* 26.9*  PLT 318  --  342 348  APTT 108*  --   --   --   HEPARINUNFRC 0.89* 0.56 0.52  --   CREATININE 5.43*  --  5.07* 4.75*    Estimated Creatinine Clearance: 14.4 mL/min (by C-G formula based on Cr of 4.75).  Assessment: Tx from [place], NSTEMI in setting of SOB/renal failure. On Eliquis 2.5mg  BID PTA. Last dose 7/2 AM. Started on heparin at [facility] for NSTEMI and continued here. Heparin was discontinued after cath. Now restarting apixaban. Hgb low but stable at 8.9, plts wnl. No s/s bleeding. S/p cath 7/11. MD would like to continue with reduced dose due to age and SCr 4.75.  Goal of Therapy:  Anticoagulation  Monitor platelets by anticoagulation protocol: Yes   Plan:  Restart apixaban 2.5 mg BID Monitor CBC, renal function, and platelets   Thank you for allowing [place] to participate in this patients care. Jens Som, PharmD Pager: [phone]

## 2016-03-25 NOTE — Consult Note (Signed)
   [facility] Inpatient Consult   03/25/2016  Kyle [name] 1940-10-15 [id number]   Patient screened for [place] Management program. Martin Majestic to bedside to discuss [place] Management services. Kyle [name], politely declines and states " I have 3 nurses in the family so I do not need any more help". Accepted [place] Management brochure with contact information to call if he ever changes his mind. Made inpatient RNCM aware that Kyle [name] declined [place] Management services.    Marthenia Rolling, MSN-Ed, RN,BSN [facility] Liaison [phone]

## 2016-03-26 DIAGNOSIS — J9601 Acute respiratory failure with hypoxia: Secondary | ICD-10-CM | POA: Diagnosis not present

## 2016-03-26 DIAGNOSIS — I25700 Atherosclerosis of coronary artery bypass graft(s), unspecified, with unstable angina pectoris: Secondary | ICD-10-CM | POA: Diagnosis not present

## 2016-03-26 DIAGNOSIS — I480 Paroxysmal atrial fibrillation: Secondary | ICD-10-CM | POA: Diagnosis not present

## 2016-03-26 DIAGNOSIS — I129 Hypertensive chronic kidney disease with stage 1 through stage 4 chronic kidney disease, or unspecified chronic kidney disease: Secondary | ICD-10-CM | POA: Diagnosis not present

## 2016-03-26 DIAGNOSIS — I482 Chronic atrial fibrillation: Secondary | ICD-10-CM | POA: Diagnosis not present

## 2016-03-26 DIAGNOSIS — I2510 Atherosclerotic heart disease of native coronary artery without angina pectoris: Secondary | ICD-10-CM | POA: Diagnosis not present

## 2016-03-26 DIAGNOSIS — I481 Persistent atrial fibrillation: Secondary | ICD-10-CM | POA: Diagnosis not present

## 2016-03-26 DIAGNOSIS — J449 Chronic obstructive pulmonary disease, unspecified: Secondary | ICD-10-CM | POA: Diagnosis not present

## 2016-03-26 DIAGNOSIS — I214 Non-ST elevation (NSTEMI) myocardial infarction: Secondary | ICD-10-CM | POA: Diagnosis not present

## 2016-03-26 DIAGNOSIS — N179 Acute kidney failure, unspecified: Secondary | ICD-10-CM | POA: Diagnosis not present

## 2016-03-26 DIAGNOSIS — N184 Chronic kidney disease, stage 4 (severe): Secondary | ICD-10-CM | POA: Diagnosis not present

## 2016-03-26 LAB — BASIC METABOLIC PANEL
ANION GAP: 11 (ref 5–15)
BUN: 81 mg/dL — ABNORMAL HIGH (ref 6–20)
CALCIUM: 9 mg/dL (ref 8.9–10.3)
CO2: 18 mmol/L — ABNORMAL LOW (ref 22–32)
Chloride: 105 mmol/L (ref 101–111)
Creatinine, Ser: 4.9 mg/dL — ABNORMAL HIGH (ref 0.61–1.24)
GFR, EST AFRICAN AMERICAN: 12 mL/min — AB (ref >60)
GFR, EST NON AFRICAN AMERICAN: 10 mL/min — AB (ref >60)
Glucose, Bld: 190 mg/dL — ABNORMAL HIGH (ref 65–99)
Potassium: 4.4 mmol/L (ref 3.5–5.1)
Sodium: 134 mmol/L — ABNORMAL LOW (ref 135–145)

## 2016-03-26 LAB — CBC
HCT: 26.5 % — ABNORMAL LOW (ref 39.0–52.0)
Hemoglobin: 8.5 g/dL — ABNORMAL LOW (ref 13.0–17.0)
MCH: 28.9 pg (ref 26.0–34.0)
MCHC: 32.1 g/dL (ref 30.0–36.0)
MCV: 90.1 fL (ref 78.0–100.0)
PLATELETS: 357 10*3/uL (ref 150–400)
RBC: 2.94 MIL/uL — ABNORMAL LOW (ref 4.22–5.81)
RDW: 17.5 % — AB (ref 11.5–15.5)
WBC: 9.5 10*3/uL (ref 4.0–10.5)

## 2016-03-26 LAB — GLUCOSE, CAPILLARY
GLUCOSE-CAPILLARY: 200 mg/dL — AB (ref 65–99)
Glucose-Capillary: 257 mg/dL — ABNORMAL HIGH (ref 65–99)

## 2016-03-26 MED ORDER — RANOLAZINE ER 500 MG PO TB12: 500.0000 mg | ORAL_TABLET | Freq: Two times a day (BID) | ORAL | Status: AC

## 2016-03-26 MED ORDER — ASPIRIN 81 MG PO TBEC: 81.0000 mg | DELAYED_RELEASE_TABLET | Freq: Every day | ORAL | Status: DC

## 2016-03-26 MED ORDER — CLONIDINE HCL 0.2 MG/24HR TD PTWK: 0.2000 mg | MEDICATED_PATCH | TRANSDERMAL | Status: DC

## 2016-03-26 MED ORDER — TORSEMIDE 20 MG PO TABS
60.0000 mg | ORAL_TABLET | Freq: Once | ORAL | Status: AC
  Administered 2016-03-26: 60 mg via ORAL
  Filled 2016-03-26: qty 3

## 2016-03-26 MED ORDER — CLOPIDOGREL BISULFATE 75 MG PO TABS: 75.0000 mg | ORAL_TABLET | Freq: Every day | ORAL | Status: DC

## 2016-03-26 MED ORDER — TORSEMIDE 20 MG PO TABS: 40.0000 mg | ORAL_TABLET | Freq: Two times a day (BID) | ORAL | Status: DC

## 2016-03-26 MED ORDER — AMLODIPINE BESYLATE 5 MG PO TABS: 5.0000 mg | ORAL_TABLET | Freq: Every day | ORAL | Status: DC

## 2016-03-26 NOTE — Progress Notes (Signed)
Physical Therapy Treatment Patient Details Name: EXZAVION ZELENAK MRN: [id number] DOB: Feb 08, 1941 Today's Date: 03/26/2016    History of Present Illness 75 y.o. male admitted with acute respiratory failure, CAP, NSTEMI. PMH of DM, BPH, MI, gout, COPD, CKD, CAD.     PT Comments    Patient continues to make progress towards PT goals, ambulated in hall, performed stair negotiation, and was receptive to energy conservation education. Anticipate patient will be safe for d/c home.  Follow Up Recommendations  Supervision for mobility/OOB;Other (comment) (Cardiopulmonary Rehab)     Equipment Recommendations  Other (comment) (rollator for energy conservation)    Recommendations for Other Services       Precautions / Restrictions Precautions Precautions: Fall Restrictions Weight Bearing Restrictions: No    Mobility  Bed Mobility Overal bed mobility: Needs Assistance;Modified Independent Bed Mobility: Sit to Supine       Sit to supine: Modified independent (Device/Increase time)   General bed mobility comments: No assist or cues needed.  Transfers Overall transfer level: Needs assistance Equipment used: Rolling walker (2 wheeled) Transfers: Sit to/from Stand Sit to Stand: Supervision         General transfer comment: VCs for hand placement and positioning at edge of surfce  Ambulation/Gait Ambulation/Gait assistance: Supervision Ambulation Distance (Feet): 210 Feet Assistive device: Rolling walker (2 wheeled) Gait Pattern/deviations: Step-through pattern;Decreased stride length Gait velocity: decreased Gait velocity interpretation: Below normal speed for age/gender General Gait Details: 2 standing rest breaks    Stairs Stairs: Yes Stairs assistance: Supervision Stair Management: One rail Left;Step to pattern;Forwards Number of Stairs: 4 General stair comments: increased time to perform, noted DOE after performing steps  Wheelchair Mobility    Modified Rankin  (Stroke Patients Only)       Balance   Sitting-balance support: No upper extremity supported Sitting balance-Leahy Scale: Good     Standing balance support: No upper extremity supported Standing balance-Leahy Scale: Fair                      Cognition Arousal/Alertness: Awake/alert Behavior During Therapy: WFL for tasks assessed/performed Overall Cognitive Status: Within Functional Limits for tasks assessed                      Exercises Other Exercises Other Exercises: continued to reinforce pursed lip breathing, educated on energy conservation strategies    General Comments        Pertinent Vitals/Pain Pain Assessment: No/denies pain    Home Living                      Prior Function            PT Goals (current goals can now be found in the care plan section) Acute Rehab PT Goals Patient Stated Goal: return to independence with mobility PT Goal Formulation: With patient/family Time For Goal Achievement: 04/03/16 Potential to Achieve Goals: Good Progress towards PT goals: Progressing toward goals    Frequency  Min 3X/week    PT Plan Discharge plan needs to be updated    Co-evaluation             End of Session Equipment Utilized During Treatment: Gait belt Activity Tolerance: Patient limited by fatigue Patient left: in chair;with call bell/phone within reach;with family/visitor present     Time: [id number] PT Time Calculation (min) (ACUTE ONLY): 18 min  Charges:  $Gait Training: 8-22 mins  G CodesDuncan Dull 03-30-2016, 12:11 PM Alben Deeds, [place] DPT  [phone]

## 2016-03-26 NOTE — Discharge Summary (Signed)
Physician Discharge Summary  Kyle [name] [id number] DOB: [birth year] DOA: 03/19/2016  PCP: Ocie Doyne, MD  Admit date: 03/19/2016 Discharge date: 03/26/2016  Recommendations for Outpatient Follow-up:  1. Pt will need to follow up with PCP in 1-2 weeks post discharge 2. Please obtain BMP to evaluate electrolytes and kidney function 3. Please also check CBC to evaluate Hg and Hct levels  Discharge Diagnoses:  Principal Problem:   Acute respiratory failure with hypoxia (HCC) Active Problems:   CAD (coronary artery disease)   Chronic systolic (congestive) heart failure (HCC)   CKD (chronic kidney disease), stage IV (HCC)   Atrial fibrillation (HCC)   GERD (gastroesophageal reflux disease)   Essential hypertension   HLD (hyperlipidemia)   DM due to underlying condition with diabetic chronic kidney disease ([place])  Discharge Condition: Stable  Diet recommendation: Heart healthy diet discussed in details   Brief Narrative:  75 y.o. male with hypertension, hyperlipidemia, diabetes mellitus, COPD, GERD, gout, depression, anxiety, CAD, s/p of CABG, congestive heart failure, CKD-IV, BPH, anemia, remote history of alcoholism, atrial fibrillation on Eliquis, presented [facility] for further evaluation of progressive dyspnea with exertion and occasionally present at rest. Pt presented to [facility] 7/3 and at that time had fevers up to 101.49F and oxygen saturations in 70's. He was treated with IV Rocephin, Zithromax, Zosyn, IV lasix. His troponins were elevated and continued to increase 2.8>>5.3>> 6.6>>6.0, consistent with NSTEMI. Cardiology was consulted, patient was treated with heparin drip. Of note, pt has CKD-IV, and was seen by renal in the past, HD recommended even 3 years ago, but patient refused dialysis in the past.   Assessment & Plan:  Principal Problem:  Acute respiratory failure with hypoxia (HCC) - secondary to acute systolic CHF, ? CAP, AoCKD4 - pt  currently off oxygen, comfortable, maintaining oxygen saturation at target range  - please see cath results below, continue with medical therapy for now, no plans for further cardiac interventions  - resumed torsemide   Active Problems:  NSTEMI, acute systolic CHF - cath results below, I have discussed the findings with pt and family at bedside  - appreciate cardio team following  - no further recommendations from cardiac standpoint    Acute on chronic kidney disease stage IV - with progressive increase in Cr in the past few months  - now Cr up likely from cardiorenal etiology, NSTEMI, Cr slightly better this AM from 5.43 --> 5.07 --> 4.75 - nephrology team following, appreciate assistance  - renal US unremarkable  - weight trend in the past 72 hours: 193 --> 194 --> 198 lbs - restarted toresmide per nephrology team   Atrial fibrillation ([place]), chronic, CHADS2 vasc score 4-5 - on Eliquis at home, resumed per cardiology    Essential hypertension - reasonable inpatient control  - on Hydralazine, Imdur, Coreg, Clonidine    Hyponatremia - stable    DM due to underlying condition with diabetic chronic kidney disease ([place]) - continue insulin per home medical regimen    Anemia of chronic disease, kidney disease - no sings of bleeding   ? Bilateral PNA, unknown pathogen - completed 5 days of Zithromax  - no fevers and no leukocytosis    Obesity - Body mass index is 29.84 kg/(m^2).   DVT prophylaxis: on Heparin drip  Code Status: Partial code, no intubation but OK with everything else per ACLS protocol if needed  Family Communication: Patient and family at bedside  Disposition Plan: Home  Consultants:   Cardiology  Nephrology  PT/OT  Procedures:   Cardiac cath 7/11 --> Severe native CAD with total occlusion of the LAD after the first septal and diagonal vessel; total occlusion of the circumflex vessel which had diffuse prior stenting; and total  occlusion of the proximal to mid RCA. There is collateralization to the distal RCA/PDA, both via the LIMA-LAD injection and also via the radial artery-intermediate injection. Patent LIMA graft supplying the LAD at the very distal LAD having an 80% stenosis in a small caliber apical segment.  RECOMMENDATION: Medical therapy with high likelihood of imminent need to initiate dialysis long-term.   Antimicrobials:   Zithromax 7/6 --> 7/10  Discharge Exam: Filed Vitals:   03/26/16 0927 03/26/16 1154  BP:  130/65  Pulse: 76 65  Temp:  98.1 F (36.7 C)  Resp:  13   Filed Vitals:   03/26/16 0800 03/26/16 0925 03/26/16 0927 03/26/16 1154  BP:  145/56  130/65  Pulse:   76 65  Temp: 98.3 F (36.8 C)   98.1 F (36.7 C)  TempSrc: Oral   Oral  Resp:    13  Height:      Weight:      SpO2:    98%    General: Pt is alert, follows commands appropriately, not in acute distress Cardiovascular: Regular rate and rhythm, no rubs, no gallops Respiratory: Clear to auscultation bilaterally, no wheezing, no crackles, no rhonchi Abdominal: Soft, non tender, non distended, bowel sounds +, no guarding Extremities: mild LE edema, no cyanosis, pulses palpable bilaterally DP and PT Neuro: Grossly nonfocal  Discharge Instructions  Discharge Instructions    Diet - low sodium heart healthy    Complete by:  As directed      Increase activity slowly    Complete by:  As directed             Medication List    STOP taking these medications        cloNIDine 0.3 mg/24hr patch  Commonly known as:  CATAPRES - Dosed in mg/24 hr  Replaced by:  cloNIDine 0.2 mg/24hr patch      TAKE these medications        allopurinol 100 MG tablet  Commonly known as:  ZYLOPRIM  Take 100 mg by mouth daily.     amLODipine 5 MG tablet  Commonly known as:  NORVASC  Take 1 tablet (5 mg total) by mouth daily.     aspirin 81 MG EC tablet  Take 1 tablet (81 mg total) by mouth daily.     BACTROBAN EX  Apply 1  application topically daily as needed (irritation).     CALTRATE 600+D3 SOFT PO  Take 1 tablet by mouth daily.     carvedilol 25 MG tablet  Commonly known as:  COREG  Take 25 mg by mouth 2 (two) times daily with a meal.     cloNIDine 0.2 mg/24hr patch  Commonly known as:  CATAPRES - Dosed in mg/24 hr  Place 1 patch (0.2 mg total) onto the skin once a week.     clopidogrel 75 MG tablet  Commonly known as:  PLAVIX  Take 1 tablet (75 mg total) by mouth daily.     cyanocobalamin 1000 MCG/ML injection  Commonly known as:  (VITAMIN B-12)  Inject 1,000 mcg into the muscle every 30 (thirty) days.     ELIQUIS 2.5 MG Tabs tablet  Generic drug:  apixaban  Take 2.5 mg by mouth 2 (two) times daily.  ferrous sulfate 325 (65 FE) MG tablet  Take 325 mg by mouth daily with breakfast.     fexofenadine 180 MG tablet  Commonly known as:  ALLEGRA  Take 180 mg by mouth daily.     hydrALAZINE 50 MG tablet  Commonly known as:  APRESOLINE  Take 50 mg by mouth 3 (three) times daily.     insulin aspart 100 UNIT/ML injection  Commonly known as:  novoLOG  Inject 5 Units into the skin See admin instructions. Per sliding scale     isosorbide mononitrate 60 MG 24 hr tablet  Commonly known as:  IMDUR  Take 60 mg by mouth daily.     NOVOLIN N 100 UNIT/ML injection  Generic drug:  insulin NPH Human  Inject 35 Units into the skin 2 (two) times daily.     omeprazole 20 MG capsule  Commonly known as:  PRILOSEC  Take 20 mg by mouth daily.     oxyCODONE 5 MG immediate release tablet  Commonly known as:  Oxy IR/ROXICODONE  Take 5 mg by mouth every 6 (six) hours as needed for moderate pain or severe pain.     PROBIOTIC MULTI-ENZYME PO  Take 1 tablet by mouth daily.     ranolazine 500 MG 12 hr tablet  Commonly known as:  RANEXA  Take 1 tablet (500 mg total) by mouth 2 (two) times daily.     rOPINIRole 1 MG tablet  Commonly known as:  REQUIP  Take 1 mg by mouth at bedtime as needed (RLS).      rosuvastatin 20 MG tablet  Commonly known as:  CRESTOR  Take 20 mg by mouth daily.     tamsulosin 0.4 MG Caps capsule  Commonly known as:  FLOMAX  Take 0.4 mg by mouth at bedtime.     torsemide 20 MG tablet  Commonly known as:  DEMADEX  Take 2 tablets (40 mg total) by mouth 2 (two) times daily.     Ubiquinol 50 MG Caps  Take 1 capsule by mouth daily.            Follow-up Information    Follow up with GAGE, [name], MD.   Specialty:  Family Medicine   Contact information:   [place] [id number] [phone]       Call Faye Ramsay, MD.   Specialty:  Internal Medicine   Why:  As needed call my cell [phone]   Contact information:   [address] [place] [address] [phone]        The results of significant diagnostics from this hospitalization (including imaging, microbiology, ancillary and laboratory) are listed below for reference.     Microbiology: Recent Results (from the past 240 hour(s))  MRSA PCR Screening     Status: None   Collection Time: 03/20/16  6:15 PM  Result Value Ref Range Status   MRSA by PCR NEGATIVE NEGATIVE Final    Comment:        The GeneXpert MRSA Assay (FDA approved for NASAL specimens only), is one component of a comprehensive MRSA colonization surveillance program. It is not intended to diagnose MRSA infection nor to guide or monitor treatment for MRSA infections.      Labs: Basic Metabolic Panel:  Recent Labs Lab 03/21/16 0949  03/22/16 0446 03/23/16 0453 03/24/16 0551 03/25/16 0452 03/26/16 0520  NA 130*  < > 130* 132* 132* 137 134*  K 4.0  < > 3.9 3.9 3.8  3.8 4.4  CL 99*  < > 99* 99* 101 104 105  CO2 20*  < > 20* 23 21* 22 18*  GLUCOSE 339*  < > 274* 160* 190* 87 190*  BUN 92*  < > 92* 89* 84* 78* 81*  CREATININE 5.36*  < > 5.36* 5.43* 5.07* 4.75* 4.90*  CALCIUM 8.8*  < > 8.7* 8.7* 8.7* 9.0 9.0  PHOS 5.2*  --   --   --  5.0* 5.1*  --   < > = values in this  interval not displayed. Liver Function Tests:  Recent Labs Lab 03/21/16 0949 03/24/16 0551 03/25/16 0452  ALBUMIN 2.5* 2.7* 2.9*   No results for input(s): LIPASE, AMYLASE in the last 168 hours. No results for input(s): AMMONIA in the last 168 hours. CBC:  Recent Labs Lab 03/22/16 0446 03/23/16 0453 03/24/16 0551 03/25/16 0452 03/26/16 0520  WBC 7.0 8.3 8.9 10.2 9.5  HGB 8.4* 8.8* 8.6* 8.9* 8.5*  HCT 25.8* 26.0* 25.9* 26.9* 26.5*  MCV 89.3 87.2 88.7 89.1 90.1  PLT 325 318 342 348 357   Cardiac Enzymes:  Recent Labs Lab 03/19/16 2114 03/20/16 0308 03/20/16 0807  TROPONINI 0.94* 0.66* 0.60*   CBG:  Recent Labs Lab 03/25/16 1132 03/25/16 1615 03/25/16 2125 03/26/16 0735 03/26/16 1135  GLUCAP 111* 125* 133* 200* 257*     SIGNED: Time coordinating discharge: 30 minutes  MAGICK-Ander Wamser, MD  [facility] 03/26/2016, 12:03 PM Pager [phone]  If 7PM-7AM, please contact night-coverage [email or web address] Password [email or web address]

## 2016-03-26 NOTE — Progress Notes (Signed)
Patient Name: Kyle [name] Date of Encounter: 03/26/2016  Hospital Problem List     Principal Problem:   Acute respiratory failure with hypoxia 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 No c/o cp or dyspnea. Wants to go home.  Inpatient Medications    . acidophilus  1 capsule Oral Daily  . allopurinol  100 mg Oral Daily  . amLODipine  5 mg Oral Daily  . apixaban  2.5 mg Oral BID  . aspirin EC  81 mg Oral Daily  . carvedilol  25 mg Oral BID WC  . cloNIDine  0.2 mg Transdermal Weekly  . clopidogrel  75 mg Oral Daily  . ferrous sulfate  325 mg Oral Q breakfast  . hydrALAZINE  50 mg Oral Q8H  . insulin aspart  0-9 Units Subcutaneous TID WC  . insulin NPH Human  20 Units Subcutaneous BID AC & HS  . isosorbide mononitrate  60 mg Oral Daily  . loratadine  10 mg Oral Daily  . pantoprazole  40 mg Oral Q1200  . ranolazine  500 mg Oral BID  . rOPINIRole  4 mg Oral QHS  . rosuvastatin  10 mg Oral q1800  . sodium chloride flush  3 mL Intravenous Q12H  . sodium chloride flush  3 mL Intravenous Q12H  . tamsulosin  0.4 mg Oral Daily    Vital Signs    Filed Vitals:   03/26/16 0513 03/26/16 0800 03/26/16 0925 03/26/16 0927  BP: 136/58  145/56   Pulse: 76   76  Temp: 98.7 F (37.1 C) 98.3 F (36.8 C)    TempSrc: Oral Oral    Resp: 18     Height:      Weight: 198 lb 11.2 oz (90.13 kg)     SpO2: 97%        Intake/Output Summary (Last 24 hours) at 03/26/16 0955 Last data filed at 03/26/16 0902  Gross per 24 hour  Intake    420 ml  Output    650 ml  Net   -230 ml   Filed Weights   03/24/16 0553 03/25/16 0438 03/26/16 0513  Weight: 194 lb 12.8 oz (88.361 kg) 199 lb (90.266 kg) 198 lb 11.2 oz (90.13 kg)    Physical Exam    General: Pleasant older male, NAD. Neuro: Alert and oriented X 3. Moves all extremities spontaneously. Psych: Normal affect. HEENT:  Normal  Neck: Supple without bruits or JVD. Lungs:  Resp regular and unlabored, CTA. Heart: RRR no s3, s4, or murmurs. Abdomen: Soft, non-tender, non-distended, BS + x 4.  Extremities: No clubbing, cyanosis, 1+ bilateral LE edema. DP/PT/Radials 2+ and equal bilaterally.  Labs    CBC  Recent Labs  03/25/16 0452 03/26/16 0520  WBC 10.2 9.5  HGB 8.9* 8.5*  HCT 26.9* 26.5*  MCV 89.1 90.1  PLT 348 [id number]   Basic Metabolic Panel  Recent  Labs  03/24/16 0551 03/25/16 0452 03/26/16 0520  NA 132* 137 134*  K 3.8 3.8 4.4  CL 101 104 105  CO2 21* 22 18*  GLUCOSE 190* 87 190*  BUN 84* 78* 81*  CREATININE 5.07* 4.75* 4.90*  CALCIUM 8.7* 9.0 9.0  PHOS 5.0* 5.1*  --    Liver Function Tests  Recent Labs  03/24/16 0551 03/25/16 0452  ALBUMIN 2.7* 2.9*   No results for input(s): LIPASE, AMYLASE in the last 72 hours. Cardiac Enzymes No results for input(s): CKTOTAL, CKMB, CKMBINDEX, TROPONINI in the last 72 hours. BNP Invalid input(s): POCBNP D-Dimer No results for input(s): DDIMER in the last 72 hours. Hemoglobin A1C No results for input(s): HGBA1C in the last 72 hours. Fasting Lipid Panel No results for input(s): CHOL, HDL, LDLCALC, TRIG, CHOLHDL, LDLDIRECT in the last 72 hours. Thyroid Function Tests No results for input(s): TSH, T4TOTAL, T3FREE, THYROIDAB in the last 72 hours.  Invalid input(s): FREET3  Telemetry    SR with PACs  ECG    No recent  Radiology      Assessment & Plan    1. CAD  s/p CABG and acute NSTEMI :No good options for revascularization , medical therapy. No angina at this time.  -- LHC 03/24/2016 Coronary Diagrams    Diagnostic Diagram            Hemo Data       Most Recent Value   Fick Cardiac Output  7.18 L/min   Fick Cardiac Output Index  3.55 (L/min)/BSA   Thermal Cardiac Output  6.12 L/min   Thermal Cardiac Output Index  3.03 (L/min)/BSA   RA A Wave  12 mmHg   RA V Wave  13 mmHg   RA Mean  11 mmHg   RV Systolic Pressure  47 mmHg   RV Diastolic Pressure  7 mmHg   RV EDP  15 mmHg   PA Systolic Pressure  48 mmHg   PA Diastolic Pressure  14 mmHg   PA Mean  27 mmHg   PW A Wave  20 mmHg   PW V Wave  48 mmHg   PW Mean  27 mmHg   AO Systolic Pressure  99991111 mmHg   AO Diastolic Pressure  43 mmHg   AO Mean  68 mmHg   LV Systolic Pressure  123456 mmHg   LV Diastolic Pressure  11 mmHg   LV EDP  20 mmHg          2. Chronic kidney disease. His creatinine was 5.07 7/11, 4.75>>4.90. Need to start diuretic as he has had a 5 lb weight. Was on demadex 20mg  BID at home.  -- Nephrology following and dosing diuretic -- Avoid RAAS inhibitors and other nephrotoxins.   3. COPD: No reports of dyspnea.  4. Hyperlipidemia: On Crestor 10 mg a day   5. Paroxysmal atrial fib: He remains in normal sinus rhythm, with PACs -- This patients CHA2DS2-VASc Score and unadjusted Ischemic Stroke Rate (% per year) is equal to 7.2 % stroke rate/year from a score of 5 Above score calculated as 1 point each if present [CHF, HTN, DM, Vascular=MI/PAD/Aortic Plaque, Age if 65-74, or Male] Above score calculated as 2 points each if present [Age > 75, or Stroke/TIA/TE]  6. Chronic systolic heart failure: after IV fluids pre-cath, PAWP mean 27 and moderate PA hypertension (with low transpulmonary gradient) and increased RA pressures. Weight has  increased 5 lb since admission and would target at least 5 lb  diuresis. No dyspnea, but does have some LEE this morning on physical exam. -- Net + 1.5 L -- Nephrology following and dosing diuretic. Toresmide 60/40mg  BID. Has close follow up with nephrology.   7. DM with renal and vascular complications: SSI  -Believe he is stable from cardiac standpoint. Nephrology following for diuresis with close follow up.   Signed, Reino Bellis NP-C Pager [phone]  I have seen and examined the patient along with Reino Bellis NP-C.  I have reviewed the chart, notes and new data.  I agree with NP's note.  Key new complaints: no angina Key examination changes: no signs of overt hypervolemia (but had elevated RA and LA pressures at cath) Key new findings / data: remarkably, no change in creatinine.  PLAN: Restarting diuretics today. OK for DC from cardiac standpoint. Will need early f/u and repeat labs. Suspect he is several pounds over "dry weight", but need to keep renal function protected.  Sanda Klein, MD, [place] [phone] 03/26/2016, 11:27 AM

## 2016-03-26 NOTE — Discharge Instructions (Signed)
Acute Kidney Injury Acute kidney injury is any condition in which there is sudden (acute) damage to the kidneys. Acute kidney injury was previously known as acute kidney failure or acute renal failure. The kidneys are two organs that lie on either side of the spine between the middle of the back and the front of the abdomen. The kidneys:  Remove wastes and extra water from the blood.   Produce important hormones. These help keep bones strong, regulate blood pressure, and help create red blood cells.   Balance the fluids and chemicals in the blood and tissues. A small amount of kidney damage may not cause problems, but a large amount of damage may make it difficult or impossible for the kidneys to work the way they should. Acute kidney injury may develop into long-lasting (chronic) kidney disease. It may also develop into a life-threatening disease called end-stage kidney disease. Acute kidney injury can get worse very quickly, so it should be treated right away. Early treatment may prevent other kidney diseases from developing. CAUSES   A problem with blood flow to the kidneys. This may be caused by:   Blood loss.   Heart disease.   Severe burns.   Liver disease.  Direct damage to the kidneys. This may be caused by:  Some medicines.   A kidney infection.   Poisoning or consuming toxic substances.   A surgical wound.   A blow to the kidney area.   A problem with urine flow. This may be caused by:   Cancer.   Kidney stones.   An enlarged prostate. SIGNS AND SYMPTOMS   Swelling (edema) of the legs, ankles, or feet.   Tiredness (lethargy).   Nausea or vomiting.   Confusion.   Problems with urination, such as:   Painful or burning feeling during urination.   Decreased urine production.   Frequent accidents in children who are potty trained.   Bloody urine.   Muscle twitches and cramps.   Shortness of breath.   Seizures.   Chest  pain or pressure. Sometimes, no symptoms are present. DIAGNOSIS Acute kidney injury may be detected and diagnosed by tests, including blood, urine, imaging, or kidney biopsy tests.  TREATMENT Treatment of acute kidney injury varies depending on the cause and severity of the kidney damage. In mild cases, no treatment may be needed. The kidneys may heal on their own. If acute kidney injury is more severe, your health care provider will treat the cause of the kidney damage, help the kidneys heal, and prevent complications from occurring. Severe cases may require a procedure to remove toxic wastes from the body (dialysis) or surgery to repair kidney damage. Surgery may involve:   Repair of a torn kidney.   Removal of an obstruction. HOME CARE INSTRUCTIONS  Follow your prescribed diet.  Take medicines only as directed by your health care provider.  Do not take any new medicines (prescription, over-the-counter, or nutritional supplements) unless approved by your health care provider. Many medicines can worsen your kidney damage or may need to have the dose adjusted.   Keep all follow-up visits as directed by your health care provider. This is important.  Observe your condition to make sure you are healing as expected. SEEK IMMEDIATE MEDICAL CARE IF:  You are feeling ill or have severe pain in the back or side.   Your symptoms return or you have new symptoms.  You have any symptoms of end-stage kidney disease. These include:   Persistent itchiness.  Loss of appetite.   Headaches.   Abnormally dark or light skin.  Numbness in the hands or feet.   Easy bruising.   Frequent hiccups.   Menstruation stops.   You have a fever.  You have increased urine production.  You have pain or bleeding when urinating. MAKE SURE YOU:   Understand these instructions.  Will watch your condition.  Will get help right away if you are not doing well or get worse.   This  information is not intended to replace advice given to you by your health care provider. Make sure you discuss any questions you have with your health care provider.   Document Released: 03/16/2011 Document Revised: 09/21/2014 Document Reviewed: 04/29/2012 Elsevier Interactive Patient Education [employer].   Respiratory failure is when your lungs are not working well and your breathing (respiratory) system fails. When respiratory failure occurs, it is difficult for your lungs to get enough oxygen, get rid of carbon dioxide, or both. Respiratory failure can be life threatening.  Respiratory failure can be acute or chronic. Acute respiratory failure is sudden, severe, and requires emergency medical treatment. Chronic respiratory failure is less severe, happens over time, and requires ongoing treatment.  WHAT ARE THE CAUSES OF ACUTE RESPIRATORY FAILURE?  Any problem affecting the heart or lungs can cause acute respiratory failure. Some of these causes include the following:  Chronic bronchitis and emphysema (COPD).   Blood clot going to a lung (pulmonary embolism).   Having water in the lungs caused by heart failure, lung injury, or infection (pulmonary edema).   Collapsed lung (pneumothorax).   Pneumonia.   Pulmonary fibrosis.   Obesity.   Asthma.   Heart failure.   Any type of trauma to the chest that can make breathing difficult.   Nerve or muscle diseases making chest movements difficult. HOW WILL MY ACUTE RESPIRATORY FAILURE BE TREATED?  Treatment of acute respiratory failure depends on the cause of the respiratory failure. Usually, you will stay in the intensive care unit so your breathing can be watched closely. Treatment can include the following:  Oxygen. Oxygen can be delivered through the following:  Nasal cannula. This is small tubing that goes in your nose to give you oxygen.  Face mask. A face mask covers your nose and mouth to give you  oxygen.  Medicine. Different medicines can be given to help with breathing. These can include:  Nebulizers. Nebulizers deliver medicines to open the air passages (bronchodilators). These medicines help to open or relax the airways in the lungs so you can breathe better. They can also help loosen mucus from your lungs.  Diuretics. Diuretic medicines can help you breathe better by getting rid of extra water in your body.  Steroids. Steroid medicines can help decrease swelling (inflammation) in your lungs.  Antibiotics.  Chest tube. If you have a collapsed lung (pneumothorax), a chest tube is placed to help reinflate the lung.  Noninvasive positive pressure ventilation (NPPV). This is a tight-fitting mask that goes over your nose and mouth. The mask has tubing that is attached to a machine. The machine blows air into the tubing, which helps to keep the tiny air sacs (alveoli) in your lungs open. This machine allows you to breathe on your own.  Ventilator. A ventilator is a breathing machine. When on a ventilator, a breathing tube is put into the lungs. A ventilator is used when you can no longer breathe well enough on your own. You may have low oxygen levels  or high carbon dioxide (CO2) levels in your blood. When you are on a ventilator, sedation and pain medicines are given to make you sleep so your lungs can heal. SEEK IMMEDIATE MEDICAL CARE IF:  You have shortness of breath (dyspnea) with or without activity.  You have rapid breathing (tachypnea).  You are wheezing.  You are unable to say more than a few words without having to catch your breath.  You find it very difficult to function normally.  You have a fast heart rate.  You have a bluish color to your finger or toe nail beds.  You have confusion or drowsiness or both.   This information is not intended to replace advice given to you by your health care provider. Make sure you discuss any questions you have with your health  care provider.   Document Released: 09/05/2013 Document Revised: 05/22/2015 Document Reviewed: 09/05/2013 Elsevier Interactive Patient Education 2016 [employer].   Diabetes Mellitus and Food It is important for you to manage your blood sugar (glucose) level. Your blood glucose level can be greatly affected by what you eat. Eating healthier foods in the appropriate amounts throughout the day at about the same time each day will help you control your blood glucose level. It can also help slow or prevent worsening of your diabetes mellitus. Healthy eating may even help you improve the level of your blood pressure and reach or maintain a healthy weight.  General recommendations for healthful eating and cooking habits include:  Eating meals and snacks regularly. Avoid going long periods of time without eating to lose weight.  Eating a diet that consists mainly of plant-based foods, such as fruits, vegetables, nuts, legumes, and whole grains.  Using low-heat cooking methods, such as baking, instead of high-heat cooking methods, such as deep frying. Work with your dietitian to make sure you understand how to use the Nutrition Facts information on food labels. HOW CAN FOOD AFFECT ME? Carbohydrates Carbohydrates affect your blood glucose level more than any other type of food. Your dietitian will help you determine how many carbohydrates to eat at each meal and teach you how to count carbohydrates. Counting carbohydrates is important to keep your blood glucose at a healthy level, especially if you are using insulin or taking certain medicines for diabetes mellitus. Alcohol Alcohol can cause sudden decreases in blood glucose (hypoglycemia), especially if you use insulin or take certain medicines for diabetes mellitus. Hypoglycemia can be a life-threatening condition. Symptoms of hypoglycemia (sleepiness, dizziness, and disorientation) are similar to symptoms of having too much alcohol.  If your health  care provider has given you approval to drink alcohol, do so in moderation and use the following guidelines:  Women should not have more than one drink per day, and men should not have more than two drinks per day. One drink is equal to:  12 oz of beer.  5 oz of wine.  1 oz of hard liquor.  Do not drink on an empty stomach.  Keep yourself hydrated. Have water, diet soda, or unsweetened iced tea.  Regular soda, juice, and other mixers might contain a lot of carbohydrates and should be counted. WHAT FOODS ARE NOT RECOMMENDED? As you make food choices, it is important to remember that all foods are not the same. Some foods have fewer nutrients per serving than other foods, even though they might have the same number of calories or carbohydrates. It is difficult to get your body what it needs when you eat foods  with fewer nutrients. Examples of foods that you should avoid that are high in calories and carbohydrates but low in nutrients include:  Trans fats (most processed foods list trans fats on the Nutrition Facts label).  Regular soda.  Juice.  Candy.  Sweets, such as cake, pie, doughnuts, and cookies.  Fried foods. WHAT FOODS CAN I EAT? Eat nutrient-rich foods, which will nourish your body and keep you healthy. The food you should eat also will depend on several factors, including:  The calories you need.  The medicines you take.  Your weight.  Your blood glucose level.  Your blood pressure level.  Your cholesterol level. You should eat a variety of foods, including:  Protein.  Lean cuts of meat.  Proteins low in saturated fats, such as fish, egg whites, and beans. Avoid processed meats.  Fruits and vegetables.  Fruits and vegetables that may help control blood glucose levels, such as apples, mangoes, and yams.  Dairy products.  Choose fat-free or low-fat dairy products, such as milk, yogurt, and cheese.  Grains, bread, pasta, and rice.  Choose whole  grain products, such as multigrain bread, whole oats, and brown rice. These foods may help control blood pressure.  Fats.  Foods containing healthful fats, such as nuts, avocado, olive oil, canola oil, and fish. DOES EVERYONE WITH DIABETES MELLITUS HAVE THE SAME MEAL PLAN? Because every person with diabetes mellitus is different, there is not one meal plan that works for everyone. It is very important that you meet with a dietitian who will help you create a meal plan that is just right for you.   This information is not intended to replace advice given to you by your health care provider. Make sure you discuss any questions you have with your health care provider.   Document Released: 05/28/2005 Document Revised: 09/21/2014 Document Reviewed: 07/28/2013 Elsevier Interactive Patient Education [employer].

## 2016-03-26 NOTE — Progress Notes (Signed)
Admit: 03/19/2016 LOS: 7  71M AoCKD (vs CKD Progression), NSTEMI, AoC CHF Exacerbation  Subjective:  SCr stable, good UOP In good spirits Wants to go home  07/12 0701 - 07/13 0700 In: 600 [P.O.:600] Out: 650 [Urine:650]  Filed Weights   03/24/16 0553 03/25/16 0438 03/26/16 0513  Weight: 88.361 kg (194 lb 12.8 oz) 90.266 kg (199 lb) 90.13 kg (198 lb 11.2 oz)    Scheduled Meds: . acidophilus  1 capsule Oral Daily  . allopurinol  100 mg Oral Daily  . amLODipine  5 mg Oral Daily  . apixaban  2.5 mg Oral BID  . aspirin EC  81 mg Oral Daily  . carvedilol  25 mg Oral BID WC  . cloNIDine  0.2 mg Transdermal Weekly  . clopidogrel  75 mg Oral Daily  . ferrous sulfate  325 mg Oral Q breakfast  . hydrALAZINE  50 mg Oral Q8H  . insulin aspart  0-9 Units Subcutaneous TID WC  . insulin NPH Human  20 Units Subcutaneous BID AC & HS  . isosorbide mononitrate  60 mg Oral Daily  . loratadine  10 mg Oral Daily  . pantoprazole  40 mg Oral Q1200  . ranolazine  500 mg Oral BID  . rOPINIRole  4 mg Oral QHS  . rosuvastatin  10 mg Oral q1800  . sodium chloride flush  3 mL Intravenous Q12H  . sodium chloride flush  3 mL Intravenous Q12H  . tamsulosin  0.4 mg Oral Daily   Continuous Infusions: . sodium chloride Stopped (03/25/16 0509)   PRN Meds:.sodium chloride, acetaminophen **OR** acetaminophen, acetaminophen, albuterol, diazepam, hydrALAZINE, nitroGLYCERIN, ondansetron **OR** ondansetron (ZOFRAN) IV, ondansetron (ZOFRAN) IV, oxyCODONE, sodium chloride flush  Current Labs: reviewed AM labs pending   Physical Exam:  Blood pressure 145/56, pulse 76, temperature 98.3 F (36.8 C), temperature source Oral, resp. rate 18, height 5\' 8"  (1.727 m), weight 90.13 kg (198 lb 11.2 oz), SpO2 97 %. IRIR, no rub. Nl s1s2 Diminished in bases Trace LEE No rashes/lesions NAD, nl WOB No c/c/e  A 1. AoCKD4 1. Stable post contrast 2. Need close /fu with Dr. Posey Pronto, already has appt will arrange labs next  wee 2. NSTEMI needing LHC/RHC -- 7/11 w/o PCI 3. ?systolic CHF exacerbation -- responded somewhat to lasix gtt, now on hold post LHC 4. CAP s/p Azithro, defervesced 5. HTN 6. COPD  1. Plan 1. Resume lasix today -- torsemide 40mg  PO BID was admission diuretic, try 60/40mg  bid 2. OK with DC 3. Daily weights, Daily Renal Panel, Strict I/Os, Avoid nephrotoxins (NSAIDs, judicious IV Contrast)   Pearson Grippe MD 03/26/2016, 10:45 AM   Recent Labs Lab 03/21/16 0949  03/24/16 0551 03/25/16 0452 03/26/16 0520  NA 130*  < > 132* 137 134*  K 4.0  < > 3.8 3.8 4.4  CL 99*  < > 101 104 105  CO2 20*  < > 21* 22 18*  GLUCOSE 339*  < > 190* 87 190*  BUN 92*  < > 84* 78* 81*  CREATININE 5.36*  < > 5.07* 4.75* 4.90*  CALCIUM 8.8*  < > 8.7* 9.0 9.0  PHOS 5.2*  --  5.0* 5.1*  --   < > = values in this interval not displayed.  Recent Labs Lab 03/24/16 0551 03/25/16 0452 03/26/16 0520  WBC 8.9 10.2 9.5  HGB 8.6* 8.9* 8.5*  HCT 25.9* 26.9* 26.5*  MCV 88.7 89.1 90.1  PLT 342 348 357

## 2016-03-26 NOTE — Care Management Important Message (Signed)
Important Message  Patient Details  Name: Kyle [name] MRN: [id number] Date of Birth: [birth year]   Medicare Important Message Given:  Yes    Nathen May 03/26/2016, 1:37 PM

## 2016-03-30 DIAGNOSIS — I4891 Unspecified atrial fibrillation: Secondary | ICD-10-CM | POA: Diagnosis not present

## 2016-03-30 DIAGNOSIS — R269 Unspecified abnormalities of gait and mobility: Secondary | ICD-10-CM | POA: Diagnosis not present

## 2016-03-30 DIAGNOSIS — I2510 Atherosclerotic heart disease of native coronary artery without angina pectoris: Secondary | ICD-10-CM | POA: Diagnosis not present

## 2016-03-30 DIAGNOSIS — I10 Essential (primary) hypertension: Secondary | ICD-10-CM | POA: Diagnosis not present

## 2016-03-30 DIAGNOSIS — Z79899 Other long term (current) drug therapy: Secondary | ICD-10-CM | POA: Diagnosis not present

## 2016-03-30 DIAGNOSIS — N189 Chronic kidney disease, unspecified: Secondary | ICD-10-CM | POA: Diagnosis not present

## 2016-03-30 DIAGNOSIS — I509 Heart failure, unspecified: Secondary | ICD-10-CM | POA: Diagnosis not present

## 2016-04-06 DIAGNOSIS — I509 Heart failure, unspecified: Secondary | ICD-10-CM | POA: Diagnosis not present

## 2016-04-06 DIAGNOSIS — N2581 Secondary hyperparathyroidism of renal origin: Secondary | ICD-10-CM | POA: Diagnosis not present

## 2016-04-06 DIAGNOSIS — N184 Chronic kidney disease, stage 4 (severe): Secondary | ICD-10-CM | POA: Diagnosis not present

## 2016-04-06 DIAGNOSIS — D649 Anemia, unspecified: Secondary | ICD-10-CM | POA: Diagnosis not present

## 2016-04-06 DIAGNOSIS — I10 Essential (primary) hypertension: Secondary | ICD-10-CM | POA: Diagnosis not present

## 2016-04-09 DIAGNOSIS — D649 Anemia, unspecified: Secondary | ICD-10-CM | POA: Diagnosis not present

## 2016-04-09 DIAGNOSIS — J90 Pleural effusion, not elsewhere classified: Secondary | ICD-10-CM | POA: Diagnosis not present

## 2016-04-09 DIAGNOSIS — I129 Hypertensive chronic kidney disease with stage 1 through stage 4 chronic kidney disease, or unspecified chronic kidney disease: Secondary | ICD-10-CM | POA: Diagnosis not present

## 2016-04-09 DIAGNOSIS — N184 Chronic kidney disease, stage 4 (severe): Secondary | ICD-10-CM | POA: Diagnosis not present

## 2016-04-09 DIAGNOSIS — I509 Heart failure, unspecified: Secondary | ICD-10-CM | POA: Diagnosis not present

## 2016-04-13 DIAGNOSIS — E785 Hyperlipidemia, unspecified: Secondary | ICD-10-CM | POA: Diagnosis not present

## 2016-04-13 DIAGNOSIS — N189 Chronic kidney disease, unspecified: Secondary | ICD-10-CM | POA: Diagnosis not present

## 2016-04-13 DIAGNOSIS — N401 Enlarged prostate with lower urinary tract symptoms: Secondary | ICD-10-CM | POA: Diagnosis not present

## 2016-04-13 DIAGNOSIS — I2510 Atherosclerotic heart disease of native coronary artery without angina pectoris: Secondary | ICD-10-CM | POA: Diagnosis not present

## 2016-04-13 DIAGNOSIS — E1165 Type 2 diabetes mellitus with hyperglycemia: Secondary | ICD-10-CM | POA: Diagnosis not present

## 2016-04-13 DIAGNOSIS — I509 Heart failure, unspecified: Secondary | ICD-10-CM | POA: Diagnosis not present

## 2016-04-13 DIAGNOSIS — G2581 Restless legs syndrome: Secondary | ICD-10-CM | POA: Diagnosis not present

## 2016-04-13 DIAGNOSIS — R110 Nausea: Secondary | ICD-10-CM | POA: Diagnosis not present

## 2016-04-13 DIAGNOSIS — M25473 Effusion, unspecified ankle: Secondary | ICD-10-CM | POA: Diagnosis not present

## 2016-04-13 DIAGNOSIS — Z6827 Body mass index (BMI) 27.0-27.9, adult: Secondary | ICD-10-CM | POA: Diagnosis not present

## 2016-04-13 DIAGNOSIS — I4891 Unspecified atrial fibrillation: Secondary | ICD-10-CM | POA: Diagnosis not present

## 2016-04-13 DIAGNOSIS — D649 Anemia, unspecified: Secondary | ICD-10-CM | POA: Diagnosis not present

## 2016-04-13 DIAGNOSIS — I10 Essential (primary) hypertension: Secondary | ICD-10-CM | POA: Diagnosis not present

## 2016-04-20 DIAGNOSIS — N189 Chronic kidney disease, unspecified: Secondary | ICD-10-CM | POA: Diagnosis not present

## 2016-04-28 DIAGNOSIS — I2510 Atherosclerotic heart disease of native coronary artery without angina pectoris: Secondary | ICD-10-CM | POA: Diagnosis not present

## 2016-04-28 DIAGNOSIS — E785 Hyperlipidemia, unspecified: Secondary | ICD-10-CM | POA: Diagnosis not present

## 2016-04-28 DIAGNOSIS — I110 Hypertensive heart disease with heart failure: Secondary | ICD-10-CM | POA: Diagnosis not present

## 2016-04-28 DIAGNOSIS — I5032 Chronic diastolic (congestive) heart failure: Secondary | ICD-10-CM | POA: Diagnosis not present

## 2016-04-29 DIAGNOSIS — N189 Chronic kidney disease, unspecified: Secondary | ICD-10-CM | POA: Diagnosis not present

## 2016-04-29 DIAGNOSIS — K8020 Calculus of gallbladder without cholecystitis without obstruction: Secondary | ICD-10-CM | POA: Diagnosis not present

## 2016-04-29 DIAGNOSIS — R110 Nausea: Secondary | ICD-10-CM | POA: Diagnosis not present

## 2016-05-01 DIAGNOSIS — K8020 Calculus of gallbladder without cholecystitis without obstruction: Secondary | ICD-10-CM | POA: Diagnosis not present

## 2016-05-01 DIAGNOSIS — R110 Nausea: Secondary | ICD-10-CM | POA: Diagnosis not present

## 2016-05-01 DIAGNOSIS — K760 Fatty (change of) liver, not elsewhere classified: Secondary | ICD-10-CM | POA: Diagnosis not present

## 2016-05-13 DIAGNOSIS — L570 Actinic keratosis: Secondary | ICD-10-CM | POA: Diagnosis not present

## 2016-05-13 DIAGNOSIS — I509 Heart failure, unspecified: Secondary | ICD-10-CM | POA: Diagnosis not present

## 2016-05-13 DIAGNOSIS — E785 Hyperlipidemia, unspecified: Secondary | ICD-10-CM | POA: Diagnosis not present

## 2016-05-13 DIAGNOSIS — E119 Type 2 diabetes mellitus without complications: Secondary | ICD-10-CM | POA: Diagnosis not present

## 2016-05-13 DIAGNOSIS — Z6826 Body mass index (BMI) 26.0-26.9, adult: Secondary | ICD-10-CM | POA: Diagnosis not present

## 2016-05-13 DIAGNOSIS — N184 Chronic kidney disease, stage 4 (severe): Secondary | ICD-10-CM | POA: Diagnosis not present

## 2016-05-13 DIAGNOSIS — I4891 Unspecified atrial fibrillation: Secondary | ICD-10-CM | POA: Diagnosis not present

## 2016-05-13 DIAGNOSIS — K210 Gastro-esophageal reflux disease with esophagitis: Secondary | ICD-10-CM | POA: Diagnosis not present

## 2016-05-13 DIAGNOSIS — E538 Deficiency of other specified B group vitamins: Secondary | ICD-10-CM | POA: Diagnosis not present

## 2016-05-13 DIAGNOSIS — M1990 Unspecified osteoarthritis, unspecified site: Secondary | ICD-10-CM | POA: Diagnosis not present

## 2016-05-13 DIAGNOSIS — I10 Essential (primary) hypertension: Secondary | ICD-10-CM | POA: Diagnosis not present

## 2016-05-13 DIAGNOSIS — I2510 Atherosclerotic heart disease of native coronary artery without angina pectoris: Secondary | ICD-10-CM | POA: Diagnosis not present

## 2016-05-20 DIAGNOSIS — D509 Iron deficiency anemia, unspecified: Secondary | ICD-10-CM | POA: Diagnosis not present

## 2016-05-20 DIAGNOSIS — N184 Chronic kidney disease, stage 4 (severe): Secondary | ICD-10-CM | POA: Diagnosis not present

## 2016-05-22 DIAGNOSIS — N2581 Secondary hyperparathyroidism of renal origin: Secondary | ICD-10-CM | POA: Diagnosis not present

## 2016-05-22 DIAGNOSIS — D631 Anemia in chronic kidney disease: Secondary | ICD-10-CM | POA: Diagnosis not present

## 2016-05-22 DIAGNOSIS — N184 Chronic kidney disease, stage 4 (severe): Secondary | ICD-10-CM | POA: Diagnosis not present

## 2016-05-22 DIAGNOSIS — I10 Essential (primary) hypertension: Secondary | ICD-10-CM | POA: Diagnosis not present

## 2016-05-22 DIAGNOSIS — E669 Obesity, unspecified: Secondary | ICD-10-CM | POA: Diagnosis not present

## 2016-06-01 DIAGNOSIS — I10 Essential (primary) hypertension: Secondary | ICD-10-CM | POA: Diagnosis not present

## 2016-06-01 DIAGNOSIS — J449 Chronic obstructive pulmonary disease, unspecified: Secondary | ICD-10-CM | POA: Diagnosis not present

## 2016-06-01 DIAGNOSIS — I2510 Atherosclerotic heart disease of native coronary artery without angina pectoris: Secondary | ICD-10-CM | POA: Diagnosis not present

## 2016-06-01 DIAGNOSIS — Z955 Presence of coronary angioplasty implant and graft: Secondary | ICD-10-CM | POA: Diagnosis not present

## 2016-06-01 DIAGNOSIS — N184 Chronic kidney disease, stage 4 (severe): Secondary | ICD-10-CM | POA: Diagnosis not present

## 2016-06-01 DIAGNOSIS — D638 Anemia in other chronic diseases classified elsewhere: Secondary | ICD-10-CM | POA: Diagnosis not present

## 2016-06-03 ENCOUNTER — Other Ambulatory Visit: Payer: Self-pay | Admitting: Vascular Surgery

## 2016-06-03 DIAGNOSIS — N184 Chronic kidney disease, stage 4 (severe): Secondary | ICD-10-CM

## 2016-06-03 DIAGNOSIS — Z01810 Encounter for preprocedural cardiovascular examination: Secondary | ICD-10-CM

## 2016-06-15 DIAGNOSIS — I4891 Unspecified atrial fibrillation: Secondary | ICD-10-CM | POA: Diagnosis not present

## 2016-06-15 DIAGNOSIS — G2581 Restless legs syndrome: Secondary | ICD-10-CM | POA: Diagnosis not present

## 2016-06-15 DIAGNOSIS — I2510 Atherosclerotic heart disease of native coronary artery without angina pectoris: Secondary | ICD-10-CM | POA: Diagnosis not present

## 2016-06-15 DIAGNOSIS — N184 Chronic kidney disease, stage 4 (severe): Secondary | ICD-10-CM | POA: Diagnosis not present

## 2016-06-15 DIAGNOSIS — Z6826 Body mass index (BMI) 26.0-26.9, adult: Secondary | ICD-10-CM | POA: Diagnosis not present

## 2016-06-15 DIAGNOSIS — I509 Heart failure, unspecified: Secondary | ICD-10-CM | POA: Diagnosis not present

## 2016-06-15 DIAGNOSIS — E538 Deficiency of other specified B group vitamins: Secondary | ICD-10-CM | POA: Diagnosis not present

## 2016-06-15 DIAGNOSIS — E785 Hyperlipidemia, unspecified: Secondary | ICD-10-CM | POA: Diagnosis not present

## 2016-06-15 DIAGNOSIS — K210 Gastro-esophageal reflux disease with esophagitis: Secondary | ICD-10-CM | POA: Diagnosis not present

## 2016-06-15 DIAGNOSIS — Z23 Encounter for immunization: Secondary | ICD-10-CM | POA: Diagnosis not present

## 2016-06-15 DIAGNOSIS — I10 Essential (primary) hypertension: Secondary | ICD-10-CM | POA: Diagnosis not present

## 2016-06-15 DIAGNOSIS — N401 Enlarged prostate with lower urinary tract symptoms: Secondary | ICD-10-CM | POA: Diagnosis not present

## 2016-06-15 DIAGNOSIS — E119 Type 2 diabetes mellitus without complications: Secondary | ICD-10-CM | POA: Diagnosis not present

## 2016-06-15 DIAGNOSIS — D638 Anemia in other chronic diseases classified elsewhere: Secondary | ICD-10-CM | POA: Diagnosis not present

## 2016-06-25 ENCOUNTER — Encounter: Payer: Self-pay | Admitting: Vascular Surgery

## 2016-06-29 DIAGNOSIS — N184 Chronic kidney disease, stage 4 (severe): Secondary | ICD-10-CM | POA: Diagnosis not present

## 2016-06-29 DIAGNOSIS — D638 Anemia in other chronic diseases classified elsewhere: Secondary | ICD-10-CM | POA: Diagnosis not present

## 2016-06-30 DIAGNOSIS — Z7901 Long term (current) use of anticoagulants: Secondary | ICD-10-CM | POA: Diagnosis not present

## 2016-06-30 DIAGNOSIS — E785 Hyperlipidemia, unspecified: Secondary | ICD-10-CM | POA: Diagnosis not present

## 2016-06-30 DIAGNOSIS — I25119 Atherosclerotic heart disease of native coronary artery with unspecified angina pectoris: Secondary | ICD-10-CM | POA: Diagnosis not present

## 2016-06-30 DIAGNOSIS — N184 Chronic kidney disease, stage 4 (severe): Secondary | ICD-10-CM | POA: Diagnosis not present

## 2016-06-30 DIAGNOSIS — I482 Chronic atrial fibrillation: Secondary | ICD-10-CM | POA: Diagnosis not present

## 2016-06-30 DIAGNOSIS — I5032 Chronic diastolic (congestive) heart failure: Secondary | ICD-10-CM | POA: Diagnosis not present

## 2016-06-30 DIAGNOSIS — I110 Hypertensive heart disease with heart failure: Secondary | ICD-10-CM | POA: Diagnosis not present

## 2016-07-01 ENCOUNTER — Ambulatory Visit (HOSPITAL_COMMUNITY)
Admission: RE | Admit: 2016-07-01 | Discharge: 2016-07-01 | Disposition: A | Discharge status: Home / Self Care | Payer: PPO | Source: Ambulatory Visit | Attending: Vascular Surgery | Admitting: Vascular Surgery

## 2016-07-01 ENCOUNTER — Ambulatory Visit (INDEPENDENT_AMBULATORY_CARE_PROVIDER_SITE_OTHER): Payer: PPO | Admitting: Vascular Surgery

## 2016-07-01 ENCOUNTER — Encounter: Payer: Self-pay | Admitting: Vascular Surgery

## 2016-07-01 VITALS — BP 149/71 | HR 60 | Temp 97.3°F | Resp 18 | Ht 68.0 in | Wt 188.0 lb

## 2016-07-01 DIAGNOSIS — Z01810 Encounter for preprocedural cardiovascular examination: Secondary | ICD-10-CM | POA: Insufficient documentation

## 2016-07-01 DIAGNOSIS — N184 Chronic kidney disease, stage 4 (severe): Secondary | ICD-10-CM | POA: Insufficient documentation

## 2016-07-01 NOTE — Progress Notes (Signed)
Vitals:   07/01/16 1232 07/01/16 1235  BP: (!) 150/60 (!) 149/71  Pulse: 60   Resp: 18   Temp: 97.3 F (36.3 C)   TempSrc: Oral   SpO2: 98%   Weight: 188 lb (85.3 kg)   Height: 5\' 8"  (1.727 m)

## 2016-07-01 NOTE — Progress Notes (Signed)
[facility] [place]  Patient name: Kyle [name] MRN: [id number] DOB: 01-02-1941 Sex: male  REASON FOR CONSULT: Discuss hemodialysis access  HPI: Kyle [name] is a 75 y.o. male, who is her today for discussion of AV access for hemodialysis. He is here today with his wife and daughter. He reports progressive renal insufficiency. He is not currently on hemodialysis. Does have a history of coronary bypass grafting in the past and had left radial artery harvest for this. He also has anemia and is undergoing Procrit injections  Past Medical History:  Diagnosis Date  . Atrial fibrillation ([place])   . BPH (benign prostatic hyperplasia)   . CAD (coronary artery disease)   . Chronic systolic (congestive) heart failure   . CKD (chronic kidney disease), stage IV ([place])   . Depression with anxiety   . 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 Essential hypertension   . GERD (gastroesophageal reflux disease)   . GERD (gastroesophageal reflux disease)   . HLD (hyperlipidemia)   . Myocardial infarction   . Pneumonia   . PONV (postoperative nausea and vomiting)   . Shortness of breath dyspnea     Family History  Problem Relation Age of Onset  . Heart disease Mother   . Heart disease Father   . Lung cancer Brother   . Diabetes Sister     SOCIAL HISTORY: Social History   Social History  . Marital status: Married    Spouse name: N/A  . Number of children: N/A  . Years of education: N/A   Occupational History  . Not on file.   Social History Main Topics  . Smoking status: Former Smoker    Quit date: 03/20/1956  . Smokeless tobacco: Never Used  . Alcohol use No  . Drug use: No  . Sexual activity: Not on file   Other Topics Concern  . Not on file   Social History Narrative  . No narrative on file    Allergies  Allergen Reactions  . Statins Other (See Comments)    Pt has tried 4-5  diff kinds. 03/20/2016 Pt reports no reaction or allergy, but has tried different types of statins to figure out which works best for him.     Current Outpatient Prescriptions  Medication Sig Dispense Refill  . allopurinol (ZYLOPRIM) 100 MG tablet Take 100 mg by mouth daily.    Marland Kitchen amLODipine (NORVASC) 5 MG tablet Take 1 tablet (5 mg total) by mouth daily. 30 tablet 0  . apixaban (ELIQUIS) 2.5 MG TABS tablet Take 2.5 mg by mouth 2 (two) times daily.    Marland Kitchen aspirin EC 81 MG EC tablet Take 1 tablet (81 mg total) by mouth daily.    . Calcium Carb-Cholecalciferol (CALTRATE 600+D3 SOFT PO) Take 1 tablet by mouth daily.    . carvedilol (COREG) 25 MG tablet Take 25 mg by mouth 2 (two) times daily with a meal.    . cloNIDine (CATAPRES - DOSED IN MG/24 HR) 0.2 mg/24hr patch Place 1 patch (0.2 mg total) onto the skin once a week. 3 patch 0  . clopidogrel (PLAVIX) 75 MG tablet Take 1 tablet (75 mg total) by mouth daily. 30 tablet 1  . cyanocobalamin (,VITAMIN B-12,) 1000 MCG/ML injection Inject 1,000 mcg into the muscle every 30 (thirty) days.    . ferrous sulfate 325 (65 FE) MG tablet Take 325 mg by mouth daily with breakfast.    .  fexofenadine (ALLEGRA) 180 MG tablet Take 180 mg by mouth daily.    . hydrALAZINE (APRESOLINE) 50 MG tablet Take 50 mg by mouth 3 (three) times daily.    . insulin aspart (NOVOLOG) 100 UNIT/ML injection Inject 5 Units into the skin See admin instructions. Per sliding scale    . insulin NPH Human (NOVOLIN N) 100 UNIT/ML injection Inject 35 Units into the skin 2 (two) times daily.    . isosorbide mononitrate (IMDUR) 60 MG 24 hr tablet Take 60 mg by mouth daily.    . Mupirocin (BACTROBAN EX) Apply 1 application topically daily as needed (irritation).    Marland Kitchen omeprazole (PRILOSEC) 20 MG capsule Take 20 mg by mouth daily.    Marland Kitchen oxyCODONE (OXY IR/ROXICODONE) 5 MG immediate release tablet Take 5 mg by mouth every 6 (six) hours as needed for moderate pain or severe pain.    . Probiotic Product  (PROBIOTIC MULTI-ENZYME PO) Take 1 tablet by mouth daily.    . ranolazine (RANEXA) 500 MG 12 hr tablet Take 1 tablet (500 mg total) by mouth 2 (two) times daily. 60 tablet 0  . rOPINIRole (REQUIP) 1 MG tablet Take 1 mg by mouth at bedtime as needed (RLS).    Marland Kitchen rosuvastatin (CRESTOR) 20 MG tablet Take 20 mg by mouth daily.    . tamsulosin (FLOMAX) 0.4 MG CAPS capsule Take 0.4 mg by mouth at bedtime.    . torsemide (DEMADEX) 20 MG tablet Take 2 tablets (40 mg total) by mouth 2 (two) times daily. 60 tablet 0  . Ubiquinol 50 MG CAPS Take 1 capsule by mouth daily.     No current facility-administered medications for this visit.     REVIEW OF SYSTEMS:  [X]  denotes positive finding, [ ]  denotes negative finding Cardiac  Comments:  Chest pain or chest pressure:     Shortness of breath upon exertion:    Short of breath when lying flat:    Irregular heart rhythm:        Vascular    Pain in calf, thigh, or hip brought on by ambulation:    Pain in feet at night that wakes you up from your sleep:     Blood clot in your veins:    Leg swelling:         Pulmonary    Oxygen at home:    Productive cough:     Wheezing:         Neurologic    Sudden weakness in arms or legs:     Sudden numbness in arms or legs:     Sudden onset of difficulty speaking or slurred speech:    Temporary loss of vision in one eye:     Problems with dizziness:         Gastrointestinal    Blood in stool:     Vomited blood:         Genitourinary    Burning when urinating:     Blood in urine:        Psychiatric    Major depression:         Hematologic    Bleeding problems:    Problems with blood clotting too easily:        Skin    Rashes or ulcers:        Constitutional    Fever or chills:      PHYSICAL EXAM: Vitals:   07/01/16 1232 07/01/16 1235  BP: (!) 150/60 (!) 149/71  Pulse: 60  Resp: 18   Temp: 97.3 F (36.3 C)   TempSrc: Oral   SpO2: 98%   Weight: 188 lb (85.3 kg)   Height: 5\' 8"   (1.727 m)     GENERAL: The patient is a well-nourished male, in no acute distress. The vital signs are documented above. CARDIOVASCULAR: 2+ radial pulses bilaterally. He does have a radial harvest incision on the left but does have a 2+ radial pulse related to collaterals from his ulnar. Rotted arteries without bruits bilaterally Relatively small surface veins although his right cephalic vein at the wrist and antecubital are patent and visible. PULMONARY: There is good air exchange  ABDOMEN: Soft and non-tender  MUSCULOSKELETAL: There are no major deformities or cyanosis. NEUROLOGIC: No focal weakness or paresthesias are detected. SKIN: There are no ulcers or rashes noted. PSYCHIATRIC: The patient has a normal affect.  DATA:  Did undergo venous and arterial studies in his upper extremities. This does show normal arterial flow bilaterally. He has very small cephalic vein on the left and moderate cephalic vein on the right  MEDICAL ISSUES: Long discussion with patient and his family regarding options for hemodialysis. Discussed catheter for short-term access and AV fistula and AV graft for long-term access. I feel that he is a candidate for fistula attempt. He does have moderate size right cephalic vein and therefore would recommend right arm fistula attempt. He is right-handed. He has had the left radial artery harvest for heart bypass. We'll schedule this at his earliest convenience as an outpatient. Will stop hisEliquis for surgery and resume immediately thereafter   [name], MD [facility] Tel [phone] Pager [phone]

## 2016-07-05 DIAGNOSIS — E1122 Type 2 diabetes mellitus with diabetic chronic kidney disease: Secondary | ICD-10-CM | POA: Diagnosis not present

## 2016-07-05 DIAGNOSIS — N189 Chronic kidney disease, unspecified: Secondary | ICD-10-CM | POA: Diagnosis not present

## 2016-07-05 DIAGNOSIS — J9602 Acute respiratory failure with hypercapnia: Secondary | ICD-10-CM | POA: Diagnosis not present

## 2016-07-05 DIAGNOSIS — J9601 Acute respiratory failure with hypoxia: Secondary | ICD-10-CM | POA: Diagnosis not present

## 2016-07-05 DIAGNOSIS — R0602 Shortness of breath: Secondary | ICD-10-CM | POA: Diagnosis not present

## 2016-07-05 DIAGNOSIS — I129 Hypertensive chronic kidney disease with stage 1 through stage 4 chronic kidney disease, or unspecified chronic kidney disease: Secondary | ICD-10-CM | POA: Diagnosis not present

## 2016-07-05 DIAGNOSIS — J189 Pneumonia, unspecified organism: Secondary | ICD-10-CM | POA: Diagnosis not present

## 2016-07-06 ENCOUNTER — Other Ambulatory Visit (HOSPITAL_COMMUNITY): Payer: PPO

## 2016-07-06 ENCOUNTER — Inpatient Hospital Stay (HOSPITAL_COMMUNITY): Payer: PPO

## 2016-07-06 ENCOUNTER — Inpatient Hospital Stay (HOSPITAL_COMMUNITY)
Admission: AD | Admit: 2016-07-06 | Discharge: 2016-07-09 | DRG: 291 | Disposition: A | Discharge status: Home / Self Care | Payer: PPO | Source: Other Acute Inpatient Hospital | Attending: Internal Medicine | Admitting: Internal Medicine

## 2016-07-06 DIAGNOSIS — I5043 Acute on chronic combined systolic (congestive) and diastolic (congestive) heart failure: Secondary | ICD-10-CM | POA: Diagnosis not present

## 2016-07-06 DIAGNOSIS — Z801 Family history of malignant neoplasm of trachea, bronchus and lung: Secondary | ICD-10-CM

## 2016-07-06 DIAGNOSIS — E1101 Type 2 diabetes mellitus with hyperosmolarity with coma: Secondary | ICD-10-CM

## 2016-07-06 DIAGNOSIS — E785 Hyperlipidemia, unspecified: Secondary | ICD-10-CM | POA: Diagnosis present

## 2016-07-06 DIAGNOSIS — I130 Hypertensive heart and chronic kidney disease with heart failure and stage 1 through stage 4 chronic kidney disease, or unspecified chronic kidney disease: Secondary | ICD-10-CM | POA: Diagnosis not present

## 2016-07-06 DIAGNOSIS — E872 Acidosis: Secondary | ICD-10-CM | POA: Diagnosis not present

## 2016-07-06 DIAGNOSIS — R7989 Other specified abnormal findings of blood chemistry: Secondary | ICD-10-CM | POA: Diagnosis present

## 2016-07-06 DIAGNOSIS — R778 Other specified abnormalities of plasma proteins: Secondary | ICD-10-CM | POA: Diagnosis present

## 2016-07-06 DIAGNOSIS — R739 Hyperglycemia, unspecified: Secondary | ICD-10-CM

## 2016-07-06 DIAGNOSIS — J9602 Acute respiratory failure with hypercapnia: Secondary | ICD-10-CM | POA: Diagnosis not present

## 2016-07-06 DIAGNOSIS — K219 Gastro-esophageal reflux disease without esophagitis: Secondary | ICD-10-CM | POA: Diagnosis present

## 2016-07-06 DIAGNOSIS — N400 Enlarged prostate without lower urinary tract symptoms: Secondary | ICD-10-CM | POA: Diagnosis not present

## 2016-07-06 DIAGNOSIS — N184 Chronic kidney disease, stage 4 (severe): Secondary | ICD-10-CM | POA: Diagnosis not present

## 2016-07-06 DIAGNOSIS — Z951 Presence of aortocoronary bypass graft: Secondary | ICD-10-CM

## 2016-07-06 DIAGNOSIS — E876 Hypokalemia: Secondary | ICD-10-CM | POA: Diagnosis present

## 2016-07-06 DIAGNOSIS — I252 Old myocardial infarction: Secondary | ICD-10-CM

## 2016-07-06 DIAGNOSIS — I5031 Acute diastolic (congestive) heart failure: Secondary | ICD-10-CM

## 2016-07-06 DIAGNOSIS — D631 Anemia in chronic kidney disease: Secondary | ICD-10-CM | POA: Diagnosis present

## 2016-07-06 DIAGNOSIS — E118 Type 2 diabetes mellitus with unspecified complications: Secondary | ICD-10-CM | POA: Diagnosis not present

## 2016-07-06 DIAGNOSIS — M109 Gout, unspecified: Secondary | ICD-10-CM | POA: Diagnosis not present

## 2016-07-06 DIAGNOSIS — E1165 Type 2 diabetes mellitus with hyperglycemia: Secondary | ICD-10-CM | POA: Diagnosis not present

## 2016-07-06 DIAGNOSIS — Z7901 Long term (current) use of anticoagulants: Secondary | ICD-10-CM

## 2016-07-06 DIAGNOSIS — I4891 Unspecified atrial fibrillation: Secondary | ICD-10-CM | POA: Diagnosis not present

## 2016-07-06 DIAGNOSIS — Z87891 Personal history of nicotine dependence: Secondary | ICD-10-CM | POA: Diagnosis not present

## 2016-07-06 DIAGNOSIS — E1122 Type 2 diabetes mellitus with diabetic chronic kidney disease: Secondary | ICD-10-CM | POA: Diagnosis not present

## 2016-07-06 DIAGNOSIS — Z7982 Long term (current) use of aspirin: Secondary | ICD-10-CM

## 2016-07-06 DIAGNOSIS — E1129 Type 2 diabetes mellitus with other diabetic kidney complication: Secondary | ICD-10-CM | POA: Diagnosis not present

## 2016-07-06 DIAGNOSIS — J189 Pneumonia, unspecified organism: Secondary | ICD-10-CM | POA: Diagnosis not present

## 2016-07-06 DIAGNOSIS — Z794 Long term (current) use of insulin: Secondary | ICD-10-CM | POA: Diagnosis not present

## 2016-07-06 DIAGNOSIS — J9621 Acute and chronic respiratory failure with hypoxia: Secondary | ICD-10-CM | POA: Diagnosis present

## 2016-07-06 DIAGNOSIS — Z8249 Family history of ischemic heart disease and other diseases of the circulatory system: Secondary | ICD-10-CM

## 2016-07-06 DIAGNOSIS — I739 Peripheral vascular disease, unspecified: Secondary | ICD-10-CM | POA: Diagnosis present

## 2016-07-06 DIAGNOSIS — I2510 Atherosclerotic heart disease of native coronary artery without angina pectoris: Secondary | ICD-10-CM | POA: Diagnosis not present

## 2016-07-06 DIAGNOSIS — J9690 Respiratory failure, unspecified, unspecified whether with hypoxia or hypercapnia: Secondary | ICD-10-CM | POA: Diagnosis not present

## 2016-07-06 DIAGNOSIS — J9622 Acute and chronic respiratory failure with hypercapnia: Secondary | ICD-10-CM | POA: Diagnosis present

## 2016-07-06 DIAGNOSIS — I129 Hypertensive chronic kidney disease with stage 1 through stage 4 chronic kidney disease, or unspecified chronic kidney disease: Secondary | ICD-10-CM | POA: Diagnosis not present

## 2016-07-06 DIAGNOSIS — Z9861 Coronary angioplasty status: Secondary | ICD-10-CM | POA: Diagnosis not present

## 2016-07-06 DIAGNOSIS — E875 Hyperkalemia: Secondary | ICD-10-CM | POA: Diagnosis present

## 2016-07-06 DIAGNOSIS — J9601 Acute respiratory failure with hypoxia: Secondary | ICD-10-CM | POA: Diagnosis not present

## 2016-07-06 DIAGNOSIS — Z833 Family history of diabetes mellitus: Secondary | ICD-10-CM

## 2016-07-06 DIAGNOSIS — I5033 Acute on chronic diastolic (congestive) heart failure: Secondary | ICD-10-CM | POA: Diagnosis not present

## 2016-07-06 DIAGNOSIS — R0602 Shortness of breath: Secondary | ICD-10-CM | POA: Diagnosis not present

## 2016-07-06 DIAGNOSIS — Z7902 Long term (current) use of antithrombotics/antiplatelets: Secondary | ICD-10-CM | POA: Diagnosis not present

## 2016-07-06 DIAGNOSIS — R0600 Dyspnea, unspecified: Secondary | ICD-10-CM | POA: Diagnosis not present

## 2016-07-06 DIAGNOSIS — IMO0002 Reserved for concepts with insufficient information to code with codable children: Secondary | ICD-10-CM

## 2016-07-06 HISTORY — DX: Gout, unspecified: M10.9

## 2016-07-06 HISTORY — DX: Unspecified osteoarthritis, unspecified site: M19.90

## 2016-07-06 HISTORY — DX: Dependence on supplemental oxygen: Z99.81

## 2016-07-06 LAB — BLOOD GAS, ARTERIAL
ACID-BASE EXCESS: 0.5 mmol/L (ref 0.0–2.0)
BICARBONATE: 25.5 mmol/L (ref 20.0–28.0)
Drawn by: 345601
FIO2: 50
LHR: 14 {breaths}/min
O2 Saturation: 95.9 %
PATIENT TEMPERATURE: 97.6
PCO2 ART: 46.5 mmHg (ref 32.0–48.0)
PEEP/CPAP: 5 cmH2O
PO2 ART: 120 mmHg — AB (ref 83.0–108.0)
VT: 550 mL
pH, Arterial: 7.354 (ref 7.350–7.450)

## 2016-07-06 LAB — TROPONIN I
TROPONIN I: 0.25 ng/mL — AB (ref <0.03)
Troponin I: 0.28 ng/mL (ref <0.03)
Troponin I: 0.33 ng/mL (ref <0.03)

## 2016-07-06 LAB — BASIC METABOLIC PANEL
ANION GAP: 12 (ref 5–15)
ANION GAP: 16 — AB (ref 5–15)
Anion gap: 15 (ref 5–15)
BUN: 80 mg/dL — ABNORMAL HIGH (ref 6–20)
BUN: 80 mg/dL — AB (ref 6–20)
BUN: 84 mg/dL — ABNORMAL HIGH (ref 6–20)
CALCIUM: 8.9 mg/dL (ref 8.9–10.3)
CALCIUM: 10.2 mg/dL (ref 8.9–10.3)
CALCIUM: 9.6 mg/dL (ref 8.9–10.3)
CHLORIDE: 97 mmol/L — AB (ref 101–111)
CO2: 24 mmol/L (ref 22–32)
CO2: 24 mmol/L (ref 22–32)
CO2: 23 mmol/L (ref 22–32)
CREATININE: 3.59 mg/dL — AB (ref 0.61–1.24)
Chloride: 96 mmol/L — ABNORMAL LOW (ref 101–111)
Chloride: 99 mmol/L — ABNORMAL LOW (ref 101–111)
Creatinine, Ser: 3.33 mg/dL — ABNORMAL HIGH (ref 0.61–1.24)
Creatinine, Ser: 3.22 mg/dL — ABNORMAL HIGH (ref 0.61–1.24)
GFR calc Af Amer: 19 mL/min — ABNORMAL LOW (ref >60)
GFR calc non Af Amer: 17 mL/min — ABNORMAL LOW (ref >60)
GFR, EST AFRICAN AMERICAN: 18 mL/min — AB (ref >60)
GFR, EST AFRICAN AMERICAN: 20 mL/min — AB (ref >60)
GFR, EST NON AFRICAN AMERICAN: 15 mL/min — AB (ref >60)
GFR, EST NON AFRICAN AMERICAN: 17 mL/min — AB (ref >60)
GLUCOSE: 131 mg/dL — AB (ref 65–99)
Glucose, Bld: 513 mg/dL (ref 65–99)
Glucose, Bld: 134 mg/dL — ABNORMAL HIGH (ref 65–99)
POTASSIUM: 3.6 mmol/L (ref 3.5–5.1)
POTASSIUM: 3.3 mmol/L — AB (ref 3.5–5.1)
Potassium: 4.8 mmol/L (ref 3.5–5.1)
SODIUM: 132 mmol/L — AB (ref 135–145)
Sodium: 138 mmol/L (ref 135–145)
Sodium: 136 mmol/L (ref 135–145)

## 2016-07-06 LAB — RENAL FUNCTION PANEL
ANION GAP: 16 — AB (ref 5–15)
Albumin: 3.3 g/dL — ABNORMAL LOW (ref 3.5–5.0)
BUN: 81 mg/dL — ABNORMAL HIGH (ref 6–20)
CO2: 24 mmol/L (ref 22–32)
Calcium: 10 mg/dL (ref 8.9–10.3)
Chloride: 99 mmol/L — ABNORMAL LOW (ref 101–111)
Creatinine, Ser: 3.63 mg/dL — ABNORMAL HIGH (ref 0.61–1.24)
GFR calc non Af Amer: 15 mL/min — ABNORMAL LOW (ref >60)
GFR, EST AFRICAN AMERICAN: 17 mL/min — AB (ref >60)
GLUCOSE: 209 mg/dL — AB (ref 65–99)
POTASSIUM: 3.7 mmol/L (ref 3.5–5.1)
Phosphorus: 2.4 mg/dL — ABNORMAL LOW (ref 2.5–4.6)
Sodium: 139 mmol/L (ref 135–145)

## 2016-07-06 LAB — URINALYSIS, ROUTINE W REFLEX MICROSCOPIC
Bilirubin Urine: NEGATIVE
Hgb urine dipstick: NEGATIVE
KETONES UR: NEGATIVE mg/dL
LEUKOCYTES UA: NEGATIVE
NITRITE: NEGATIVE
PH: 5.5 (ref 5.0–8.0)
Protein, ur: 100 mg/dL — AB
SPECIFIC GRAVITY, URINE: 1.015 (ref 1.005–1.030)

## 2016-07-06 LAB — GLUCOSE, CAPILLARY
GLUCOSE-CAPILLARY: 114 mg/dL — AB (ref 65–99)
GLUCOSE-CAPILLARY: 152 mg/dL — AB (ref 65–99)
GLUCOSE-CAPILLARY: 155 mg/dL — AB (ref 65–99)
GLUCOSE-CAPILLARY: 192 mg/dL — AB (ref 65–99)
GLUCOSE-CAPILLARY: 228 mg/dL — AB (ref 65–99)
GLUCOSE-CAPILLARY: 262 mg/dL — AB (ref 65–99)
GLUCOSE-CAPILLARY: 310 mg/dL — AB (ref 65–99)
GLUCOSE-CAPILLARY: 525 mg/dL — AB (ref 65–99)
Glucose-Capillary: 424 mg/dL — ABNORMAL HIGH (ref 65–99)
Glucose-Capillary: 440 mg/dL — ABNORMAL HIGH (ref 65–99)
Glucose-Capillary: 372 mg/dL — ABNORMAL HIGH (ref 65–99)
Glucose-Capillary: 209 mg/dL — ABNORMAL HIGH (ref 65–99)
Glucose-Capillary: 121 mg/dL — ABNORMAL HIGH (ref 65–99)
Glucose-Capillary: 147 mg/dL — ABNORMAL HIGH (ref 65–99)
Glucose-Capillary: 197 mg/dL — ABNORMAL HIGH (ref 65–99)
Glucose-Capillary: 102 mg/dL — ABNORMAL HIGH (ref 65–99)

## 2016-07-06 LAB — PROCALCITONIN: Procalcitonin: 13.9 ng/mL

## 2016-07-06 LAB — LACTIC ACID, PLASMA
Lactic Acid, Venous: 1.5 mmol/L (ref 0.5–1.9)
Lactic Acid, Venous: 2 mmol/L (ref 0.5–1.9)

## 2016-07-06 LAB — URINE MICROSCOPIC-ADD ON

## 2016-07-06 LAB — TRIGLYCERIDES: Triglycerides: 82 mg/dL (ref <150)

## 2016-07-06 LAB — MAGNESIUM: MAGNESIUM: 2 mg/dL (ref 1.7–2.4)

## 2016-07-06 LAB — MRSA PCR SCREENING: MRSA by PCR: NEGATIVE

## 2016-07-06 LAB — PHOSPHORUS: PHOSPHORUS: 4 mg/dL (ref 2.5–4.6)

## 2016-07-06 MED ORDER — HYDRALAZINE HCL 20 MG/ML IJ SOLN
10.0000 mg | INTRAMUSCULAR | Status: DC | PRN
  Administered 2016-07-06: 20 mg via INTRAVENOUS
  Administered 2016-07-06: 10 mg via INTRAVENOUS
  Administered 2016-07-07: 20 mg via INTRAVENOUS
  Administered 2016-07-07 – 2016-07-08 (×2): 10 mg via INTRAVENOUS
  Filled 2016-07-06 (×5): qty 1

## 2016-07-06 MED ORDER — POTASSIUM CHLORIDE CRYS ER 20 MEQ PO TBCR
60.0000 meq | EXTENDED_RELEASE_TABLET | Freq: Once | ORAL | Status: AC
  Administered 2016-07-06: 60 meq via ORAL
  Filled 2016-07-06: qty 3

## 2016-07-06 MED ORDER — DEXTROSE 5 % IV SOLN
500.0000 mg | INTRAVENOUS | Status: DC
  Administered 2016-07-06 – 2016-07-08 (×3): 500 mg via INTRAVENOUS
  Filled 2016-07-06 (×5): qty 500

## 2016-07-06 MED ORDER — FENTANYL 2500MCG IN NS 250ML (10MCG/ML) PREMIX INFUSION
25.0000 ug/h | INTRAVENOUS | Status: DC
  Administered 2016-07-06: 100 ug/h via INTRAVENOUS
  Filled 2016-07-06: qty 250

## 2016-07-06 MED ORDER — ORAL CARE MOUTH RINSE: 15.0000 mL | Freq: Four times a day (QID) | OROMUCOSAL | Status: DC

## 2016-07-06 MED ORDER — CHLORHEXIDINE GLUCONATE 0.12% ORAL RINSE (MEDLINE KIT)
15.0000 mL | Freq: Two times a day (BID) | OROMUCOSAL | Status: DC
  Administered 2016-07-06 (×2): 15 mL via OROMUCOSAL

## 2016-07-06 MED ORDER — FENTANYL CITRATE (PF) 100 MCG/2ML IJ SOLN: 50.0000 ug | Freq: Once | INTRAMUSCULAR | Status: DC

## 2016-07-06 MED ORDER — APIXABAN 2.5 MG PO TABS
2.5000 mg | ORAL_TABLET | Freq: Two times a day (BID) | ORAL | Status: DC
  Administered 2016-07-06 – 2016-07-08 (×6): 2.5 mg via ORAL
  Filled 2016-07-06 (×8): qty 1

## 2016-07-06 MED ORDER — SODIUM CHLORIDE 0.9 % IV SOLN
250.0000 mL | INTRAVENOUS | Status: DC | PRN
  Administered 2016-07-06: 250 mL via INTRAVENOUS

## 2016-07-06 MED ORDER — ORAL CARE MOUTH RINSE
15.0000 mL | OROMUCOSAL | Status: DC
  Administered 2016-07-06 (×2): 15 mL via OROMUCOSAL

## 2016-07-06 MED ORDER — FUROSEMIDE 10 MG/ML IJ SOLN
80.0000 mg | Freq: Once | INTRAMUSCULAR | Status: AC
  Administered 2016-07-06: 80 mg via INTRAVENOUS
  Filled 2016-07-06: qty 8

## 2016-07-06 MED ORDER — FAMOTIDINE 40 MG/5ML PO SUSR
20.0000 mg | Freq: Two times a day (BID) | ORAL | Status: DC
  Filled 2016-07-06: qty 2.5

## 2016-07-06 MED ORDER — PROPOFOL 1000 MG/100ML IV EMUL: 0.0000 ug/kg/min | INTRAVENOUS | Status: DC

## 2016-07-06 MED ORDER — INSULIN ASPART 100 UNIT/ML ~~LOC~~ SOLN
10.0000 [IU] | Freq: Once | SUBCUTANEOUS | Status: AC
  Administered 2016-07-06: 10 [IU] via SUBCUTANEOUS

## 2016-07-06 MED ORDER — DEXTROSE 5 % IV SOLN
1.0000 g | INTRAVENOUS | Status: DC
  Administered 2016-07-06 – 2016-07-08 (×3): 1 g via INTRAVENOUS
  Filled 2016-07-06 (×5): qty 10

## 2016-07-06 MED ORDER — ASPIRIN 300 MG RE SUPP
300.0000 mg | RECTAL | Status: AC
  Administered 2016-07-06: 300 mg via RECTAL
  Filled 2016-07-06: qty 1

## 2016-07-06 MED ORDER — INSULIN ASPART 100 UNIT/ML ~~LOC~~ SOLN
2.0000 [IU] | SUBCUTANEOUS | Status: DC
  Administered 2016-07-06: 6 [IU] via SUBCUTANEOUS

## 2016-07-06 MED ORDER — FUROSEMIDE 10 MG/ML IJ SOLN
160.0000 mg | Freq: Four times a day (QID) | INTRAVENOUS | Status: DC
  Administered 2016-07-06 – 2016-07-08 (×7): 160 mg via INTRAVENOUS
  Filled 2016-07-06 (×11): qty 16

## 2016-07-06 MED ORDER — FAMOTIDINE 40 MG/5ML PO SUSR
20.0000 mg | Freq: Every day | ORAL | Status: DC
  Administered 2016-07-06: 20 mg
  Filled 2016-07-06: qty 2.5

## 2016-07-06 MED ORDER — FUROSEMIDE 10 MG/ML IJ SOLN
160.0000 mg | Freq: Once | INTRAMUSCULAR | Status: AC
  Administered 2016-07-06: 160 mg via INTRAVENOUS
  Filled 2016-07-06: qty 16

## 2016-07-06 MED ORDER — PROPOFOL 1000 MG/100ML IV EMUL
INTRAVENOUS | Status: AC
  Administered 2016-07-06: 25 ug/kg/min
  Filled 2016-07-06: qty 100

## 2016-07-06 MED ORDER — SODIUM CHLORIDE 0.9 % IV SOLN
INTRAVENOUS | Status: DC
  Administered 2016-07-06: 3.8 [IU]/h via INTRAVENOUS
  Filled 2016-07-06 (×2): qty 2.5

## 2016-07-06 MED ORDER — CHLORHEXIDINE GLUCONATE 0.12% ORAL RINSE (MEDLINE KIT)
15.0000 mL | Freq: Two times a day (BID) | OROMUCOSAL | Status: DC
  Administered 2016-07-06: 15 mL via OROMUCOSAL

## 2016-07-06 MED ORDER — FENTANYL BOLUS VIA INFUSION
25.0000 ug | INTRAVENOUS | Status: DC | PRN
  Filled 2016-07-06: qty 25

## 2016-07-06 MED ORDER — CLONIDINE HCL 0.2 MG/24HR TD PTWK
0.2000 mg | MEDICATED_PATCH | TRANSDERMAL | Status: DC
  Administered 2016-07-06: 0.2 mg via TRANSDERMAL
  Filled 2016-07-06: qty 1

## 2016-07-06 MED ORDER — SODIUM POLYSTYRENE SULFONATE 15 GM/60ML PO SUSP
15.0000 g | Freq: Once | ORAL | Status: AC
  Administered 2016-07-06: 15 g via ORAL
  Filled 2016-07-06: qty 60

## 2016-07-06 MED ORDER — ASPIRIN 81 MG PO CHEW: 324.0000 mg | CHEWABLE_TABLET | ORAL | Status: AC

## 2016-07-06 NOTE — Progress Notes (Signed)
Patient with anion gap to 16 and hypokalemia to 3.3. Continue insulin drip and give 60 mEq kdur now. Recheck BMP q6h x 4. Receiving high dose IV lasix; anticipate needing continued potassium repletion. Plan to schedule kdur once off insulin drip.   Olene Floss, MD [place], PGY-2

## 2016-07-06 NOTE — Procedures (Signed)
Extubation Procedure Note  Patient Details:   Name: Kyle [name] DOB: [birth year] MRN: [id number]   Airway Documentation:  Airway 7.5 mm (Active)  Secured at (cm) 25 cm 07/06/2016  8:03 AM  Measured From Lips 07/06/2016  8:03 AM  Secured Location Right 07/06/2016  8:03 AM  Secured By [employer] 07/06/2016  8:03 AM  Tube Holder Repositioned Yes 07/06/2016  8:03 AM  Cuff Pressure (cm H2O) 24 cm H2O 07/06/2016  3:25 AM  Site Condition Dry 07/06/2016  8:03 AM    Evaluation  O2 sats: stable throughout Complications: No apparent complications Patient did tolerate procedure well. Bilateral Breath Sounds: Clear   Yes   Patient extubated to a 4L [place]. Cuff leak was heard. No stridor noted. RT student and patient's family with RT during extubation. Patient is tolerating well. RT will continue to monitor.  Tiburcio Bash 07/06/2016, 11:36 AM

## 2016-07-06 NOTE — Progress Notes (Signed)
34mL fentanyl wasted in sink with Daron Offer RN

## 2016-07-06 NOTE — Consult Note (Signed)
JONELL KRONTZ Admit Date: 07/06/2016 07/06/2016 Rexene Agent Requesting Physician:  Nelda Marseille MD  Reason for Consult:  SOB, CKD4, ? Need for dialysis HPI:  67M admit 10/23 with acute onset SOB and fever with infiltration on CXR concernign for CAP and acute exacerbation of CHF.  PMH includes  Gearldine Bienenstock MD at [facility], plan for AVF 10/30 [facility] Early; lives in [place]; Renal US 072/017 without structural issues  HTN on amlodpine, carvedilol, clonidine tts, hydralazine, torsemide  CAD s/p CABG on imdur and ranexa  hx/o dCHF  AFib on apixaban  BPH on flomax  GERD  Gout on allopurinol  DM2 on insulin  Extubated this AM to RA. Has been febrile  Outpt SCr 10/2 was 3.41, similar to admission.  NO acute K or HCO3 issues.  On ceftriaxone and azithromycin for CAP  Has anemia of CKD on procrit 10kq2wk at [facility]    Creatinine, Ser (mg/dL)  Date Value  07/06/2016 3.59 (H)  03/26/2016 4.90 (H)  03/25/2016 4.75 (H)  03/24/2016 5.07 (H)  03/23/2016 5.43 (H)  03/22/2016 5.36 (H)  03/21/2016 5.45 (H)  03/21/2016 5.36 (H)  03/20/2016 5.24 (H)  03/20/2016 5.30 (H)  ] I/Os: I/O last 3 completed shifts: In: 149.7 [I.V.:83.7; IV Piggyback:66] Out: 575 [Urine:575]  ROS Balance of 12 systems is negative w/ exceptions as above  PMH  Past Medical History:  Diagnosis Date  . Atrial fibrillation ([place])   . BPH (benign prostatic hyperplasia)   . CAD (coronary artery disease)   . Chronic systolic (congestive) heart failure   . CKD (chronic kidney disease), stage IV ([place])   . Depression with anxiety   . 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 Essential hypertension   . GERD (gastroesophageal reflux disease)   . GERD (gastroesophageal reflux disease)   . HLD (hyperlipidemia)   . Myocardial infarction   . Pneumonia   . PONV (postoperative nausea and vomiting)   . Shortness of breath dyspnea    PSH  Past Surgical History:  Procedure  Laterality Date  . ADENOIDECTOMY    . Cardiac bypass    . CARDIAC CATHETERIZATION    . CARDIAC CATHETERIZATION N/A 03/24/2016   Procedure: Right/Left Heart Cath and Coronary/Graft Angiography;  Surgeon: Troy Sine, MD;  Location: [facility];  Service: Cardiovascular;  Laterality: N/A;  . CORONARY ARTERY BYPASS GRAFT    . EYE SURGERY    . TONSILLECTOMY     FH  Family History  Problem Relation Age of Onset  . Heart disease Mother   . Heart disease Father   . Lung cancer Brother   . Diabetes Sister    SH  reports that he quit smoking about 60 years ago. He has never used smokeless tobacco. He reports that he does not drink alcohol or use drugs. Allergies  Allergies  Allergen Reactions  . Statins Other (See Comments)    Pt has tried 4-5 diff kinds. 03/20/2016 Pt reports no reaction or allergy, but has tried different types of statins to figure out which works best for him.    Home medications Prior to Admission medications   Medication Sig Start Date End Date Taking? Authorizing Provider  allopurinol (ZYLOPRIM) 100 MG tablet Take 100 mg by mouth daily.   Yes Historical Provider, MD  amLODipine (NORVASC) 5 MG tablet Take 1 tablet (5 mg total) by mouth daily. 03/26/16  Yes Theodis Blaze, MD  apixaban (ELIQUIS) 2.5 MG TABS  tablet Take 2.5 mg by mouth 2 (two) times daily.   Yes Historical Provider, MD  carvedilol (COREG) 25 MG tablet Take 25 mg by mouth 2 (two) times daily with a meal.   Yes Historical Provider, MD  cloNIDine (CATAPRES - DOSED IN MG/24 HR) 0.2 mg/24hr patch Place 1 patch (0.2 mg total) onto the skin once a week. 03/26/16  Yes Theodis Blaze, MD  clopidogrel (PLAVIX) 75 MG tablet Take 1 tablet (75 mg total) by mouth daily. 03/26/16  Yes Theodis Blaze, MD  cyanocobalamin (,VITAMIN B-12,) 1000 MCG/ML injection Inject 1,000 mcg into the muscle every 30 (thirty) days.   Yes Historical Provider, MD  hydrALAZINE (APRESOLINE) 50 MG tablet Take 50 mg by mouth 3 (three) times  daily.   Yes Historical Provider, MD  insulin aspart (NOVOLOG) 100 UNIT/ML injection Inject 15-30 Units into the skin 3 (three) times daily as needed for high blood sugar. Per sliding scale    Yes Historical Provider, MD  insulin NPH Human (NOVOLIN N) 100 UNIT/ML injection Inject 30 Units into the skin 2 (two) times daily.    Yes Historical Provider, MD  isosorbide mononitrate (IMDUR) 60 MG 24 hr tablet Take 60 mg by mouth daily.   Yes Historical Provider, MD  nitroGLYCERIN (NITROSTAT) 0.4 MG SL tablet Place 0.4 mg under the tongue every 5 (five) minutes as needed for chest pain.   Yes Historical Provider, MD  omeprazole (PRILOSEC) 20 MG capsule Take 20 mg by mouth daily.   Yes Historical Provider, MD  ondansetron (ZOFRAN-ODT) 4 MG disintegrating tablet Take 4 mg by mouth every 4 (four) hours as needed for nausea or vomiting.   Yes Historical Provider, MD  oxyCODONE (OXY IR/ROXICODONE) 5 MG immediate release tablet Take 5 mg by mouth every 6 (six) hours as needed for moderate pain or severe pain.   Yes Historical Provider, MD  ranolazine (RANEXA) 500 MG 12 hr tablet Take 1 tablet (500 mg total) by mouth 2 (two) times daily. 03/26/16  Yes Theodis Blaze, MD  rOPINIRole (REQUIP) 1 MG tablet Take 1 mg by mouth at bedtime as needed (RLS).   Yes Historical Provider, MD  rosuvastatin (CRESTOR) 20 MG tablet Take 20 mg by mouth daily.   Yes Historical Provider, MD  tamsulosin (FLOMAX) 0.4 MG CAPS capsule Take 0.8 mg by mouth at bedtime.    Yes Historical Provider, MD  torsemide (DEMADEX) 20 MG tablet Take 2 tablets (40 mg total) by mouth 2 (two) times daily. 03/26/16  Yes Theodis Blaze, MD  aspirin EC 81 MG EC tablet Take 1 tablet (81 mg total) by mouth daily. 03/26/16   Theodis Blaze, MD  Calcium Carb-Cholecalciferol (CALTRATE 600+D3 SOFT PO) Take 1 tablet by mouth daily.    Historical Provider, MD  ferrous sulfate 325 (65 FE) MG tablet Take 325 mg by mouth daily with breakfast.    Historical Provider, MD   fexofenadine (ALLEGRA) 180 MG tablet Take 180 mg by mouth daily.    Historical Provider, MD  Mupirocin (BACTROBAN EX) Apply 1 application topically daily as needed (irritation).    Historical Provider, MD  Probiotic Product (PROBIOTIC MULTI-ENZYME PO) Take 1 tablet by mouth daily.    Historical Provider, MD  Ubiquinol 50 MG CAPS Take 50 mg by mouth daily.     Historical Provider, MD    Current Medications Scheduled Meds: . apixaban  2.5 mg Oral BID  . azithromycin  500 mg Intravenous Q24H  . cefTRIAXone (ROCEPHIN)  IV  1 g Intravenous Q24H  . chlorhexidine gluconate (MEDLINE KIT)  15 mL Mouth Rinse BID  . cloNIDine  0.2 mg Transdermal Weekly  . famotidine  20 mg Per Tube Daily  . fentaNYL (SUBLIMAZE) injection  50 mcg Intravenous Once  . mouth rinse  15 mL Mouth Rinse 10 times per day   Continuous Infusions: . fentaNYL infusion INTRAVENOUS Stopped (07/06/16 1110)  . insulin (NOVOLIN-R) infusion 7.5 Units/hr (07/06/16 1207)  . propofol (DIPRIVAN) infusion Stopped (07/06/16 1115)   PRN Meds:.sodium chloride, fentaNYL, hydrALAZINE  CBC No results for input(s): WBC, NEUTROABS, HGB, HCT, MCV, PLT in the last 168 hours. Basic Metabolic Panel  Recent Labs Lab 07/06/16 0427  NA 132*  K 4.8  CL 96*  CO2 24  GLUCOSE 513*  BUN 80*  CREATININE 3.59*  CALCIUM 8.9  PHOS 4.0    Physical Exam  Blood pressure (!) 160/87, pulse (!) 112, temperature 98.4 F (36.9 C), temperature source Oral, resp. rate 15, height 5' 8"  (1.727 m), weight 83 kg (182 lb 15.7 oz), SpO2 100 %. GEN: NAD, conversant, pleasant ENT: NCAT EYES: EOMI CV: IRIR PULM: diminished throughout,  ABD: s/n/d, obese SKIN: no rashes/lesiosn SUP:JSRPR to 1+ LEE   Assessment 1M with CKD4, acute resp failure 2/2 CHF and CAP  1. CKD4, BL SCr mid 3s; planning for iHD with AVF scheduled 10/30; Patel MD 2. Resp Failure, now extubated 3. CAP, fever on CTX+Azithro 4. CAD s/p CABG 5. HTN on 5 agents 6. dHF  exacerbation 7. DM2 on insluin 8. AFib on NOAC 9. BPH on flomax   Plan 1. No RRT necessary currently 2. Cont supportive care, resume BP meds as able if BP high 3. Daily weights, Daily Renal Panel, Strict I/Os, Avoid nephrotoxins (NSAIDs, judicious IV Contrast)  4. WIll cont to follow closely   Pearson Grippe MD [phone] pgr 07/06/2016, 1:17 PM

## 2016-07-06 NOTE — Consult Note (Signed)
Cardiology Consult    Patient ID: Kyle [name] MRN: [id number], DOB/AGE: 77/04/1941   Admit date: 07/06/2016 Date of Consult: 07/06/2016  Primary Physician: Ocie Doyne, MD Reason for Consult: CHF Primary Cardiologist: New, follows with Dr. Bettina Gavia in [place] Requesting Provider: Dr. Nelda Marseille  Patient Profile    75 year old male with a past medical history of HTN, HLD, paroxsymal atrial fibrillation (on Eliquis) CAD s/p CABG (21 years ago), CKD stage IV, and chronic diastolic CHF (last Echo did not estimate LVEF). He presented to [facility] ED on 07/06/16 with respiratory failure and was intubated and transferred to [facility].   History of Present Illness   Kyle [name] was sitting in his recliner on Saturday morning (07/05/16) around 9 am and  became acutely SOB. His wife says that he was making a sound like he was trying to clear his throat and told her to call 911. He complained of chest tightness and his wife gave him 2 SL Nitro, which helped his chest pain. EMS arrived and he was transported to [facility]. He was unresponsive on arrival to the ED at [facility], so he was intubated for airway protection. He was extubated today.   Kyle [name] has a known history of chronic diastolic CHF. His wife reports that his dyspnea came on acutely which was similar to his prior presentations with CHF. Admits to eating [place] food the night prior to admission.   His B[place] was elevated at 17[address], creatinine was 3.59, and troponin was 0.25. Chest x ray showed right to mid lower lung infiltrates, he is being treated for CAP.   Last heart cath was in July 2017, when he presented with chest pain. He had a patent LIMA to LAD, patent radial graft to Ramus and occluded vein graft to the distal RCA. He was managed medically. He was last seen by Dr. Bettina Gavia last week and was doing well. He weighs himself every day and did not notice a change in his weight.   Past Medical History   Past Medical History:    Diagnosis Date  . Atrial fibrillation ([place])   . BPH (benign prostatic hyperplasia)   . CAD (coronary artery disease)   . Chronic systolic (congestive) heart failure   . CKD (chronic kidney disease), stage IV ([place])   . Depression with anxiety   . 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 Essential hypertension   . GERD (gastroesophageal reflux disease)   . GERD (gastroesophageal reflux disease)   . HLD (hyperlipidemia)   . Myocardial infarction   . Pneumonia   . PONV (postoperative nausea and vomiting)   . Shortness of breath dyspnea     Past Surgical History:  Procedure Laterality Date  . ADENOIDECTOMY    . Cardiac bypass    . CARDIAC CATHETERIZATION    . CARDIAC CATHETERIZATION N/A 03/24/2016   Procedure: Right/Left Heart Cath and Coronary/Graft Angiography;  Surgeon: Troy Sine, MD;  Location: [place] [facility];  Service: Cardiovascular;  Laterality: N/A;  . CORONARY ARTERY BYPASS GRAFT    . EYE SURGERY    . TONSILLECTOMY       Allergies  Allergies  Allergen Reactions  . Statins Other (See Comments)    Pt has tried 4-5 diff kinds. 03/20/2016 Pt reports no reaction or allergy, but has tried different types of statins to figure out which works best for him.     Inpatient Medications    .  apixaban  2.5 mg Oral BID  . azithromycin  500 mg Intravenous Q24H  . cefTRIAXone (ROCEPHIN)  IV  1 g Intravenous Q24H  . chlorhexidine gluconate (MEDLINE KIT)  15 mL Mouth Rinse BID  . cloNIDine  0.2 mg Transdermal Weekly  . famotidine  20 mg Per Tube Daily  . fentaNYL (SUBLIMAZE) injection  50 mcg Intravenous Once  . mouth rinse  15 mL Mouth Rinse 10 times per day    Family History    Family History  Problem Relation Age of Onset  . Heart disease Mother   . Heart disease Father   . Lung cancer Brother   . Diabetes Sister     Social History    Social History   Social History  . Marital status: Married    Spouse name: N/A  .  Number of children: N/A  . Years of education: N/A   Occupational History  . Not on file.   Social History Main Topics  . Smoking status: Former Smoker    Quit date: 03/20/1956  . Smokeless tobacco: Never Used  . Alcohol use No  . Drug use: No  . Sexual activity: Not on file   Other Topics Concern  . Not on file   Social History Narrative  . No narrative on file     Review of Systems    General:  No chills, fever, night sweats or weight changes.  Cardiovascular:  No chest pain, dyspnea on exertion, edema, orthopnea, palpitations, paroxysmal nocturnal dyspnea. Dermatological: No rash, lesions/masses Respiratory: No cough, dyspnea Urologic: No hematuria, dysuria Abdominal:   No nausea, vomiting, diarrhea, bright red blood per rectum, melena, or hematemesis Neurologic:  No visual changes, wkns, changes in mental status. All other systems reviewed and are otherwise negative except as noted above.  Physical Exam    Blood pressure (!) 160/87, pulse (!) 112, temperature 98.4 F (36.9 C), temperature source Oral, resp. rate 15, height _0  (1.727 m), weight 182 lb 15.7 oz (83 kg), SpO2 100 %.  General: Pleasant, NAD Psych: Normal affect. Neuro: Alert and oriented X 3. Moves all extremities spontaneously. HEENT: Normal  Neck: Supple without bruits or  Jaw JVD. Lungs:  Resp regular and unlabored Heart: irregular rhythm, No murmer.  Abdomen: Soft, non-tender, non-distended, BS + x 4.  Extremities: No clubbing, cyanosis or edema. DP/PT/Radials 2+ and equal bilaterally.  Labs     Recent Labs  07/06/16 0427  TROPONINI 0.25*   Lab Results  Component Value Date   WBC 9.5 03/26/2016   HGB 8.5 (L) 03/26/2016   HCT 26.5 (L) 03/26/2016   MCV 90.1 03/26/2016   PLT 357 03/26/2016    Recent Labs Lab 07/06/16 0427  NA 132*  K 4.8  CL 96*  CO2 24  BUN 80*  CREATININE 3.59*  CALCIUM 8.9  GLUCOSE 513*   Lab Results  Component Value Date   TRIG 82 07/06/2016   No  results found for: [facility]   Radiology Studies    Dg Chest Port 1 View  Result Date: 07/06/2016 CLINICAL DATA:  75 year old male with respiratory failure. EXAM: PORTABLE CHEST 1 VIEW COMPARISON:  Chest radiograph dated 07/05/2016 and CT dated 01/09/2016 FINDINGS: Endotracheal tube above the carina in stable positioning. Enteric tube courses into the left upper abdomen with tip beyond the inferior margin of the image. There is cardiomegaly moderate with mild interstitial prominence. An area of hazy density involving the right mid to lower lung field is concerning for infiltrates.  Clinical correlation and follow-up recommended. The left lung is clear. No pleural effusion or pneumothorax. Median sternotomy wires and CABG vascular clips noted. IMPRESSION: Right mid to lower lung field hazy density concerning for infiltrate. Clinical correlation follow-up recommended. Moderate cardiomegaly. Electronically Signed   By: Anner Crete M.D.   On: 07/06/2016 06:19    EKG & Cardiac Imaging    EKG: Afib with anterolateral ST depression and T wave inversion  Echocardiogram: pending   Assessment & Plan    1. Acute on chronic diastolic CHF: Presents with acute onset dyspnea, elevated BNP and 7-8 cm JVD. Clinical picture consistent with volume overload and had recently eaten high salt meal. Has gotten 110m IV Lasix this am, with only 575 out. Will increase Lasix to 1644mq 8 hours and see his response to diuresis. Right heart cath in July of this year had normal C.O. And elevated PAWP of 27.   Echo pending.   2. CAP and respiratory failure: Extubated this am, management per primary team.   3. Paroxsymal atrial fibrillation: This patients CHA2DS2-VASc Score and unadjusted Ischemic Stroke Rate (% per year) is equal to 7.2 % stroke rate/year from a score of 5 Above score calculated as 1 point each if present [CHF, HTN, DM, Vascular=MI/PAD/Aortic Plaque, Age if 65-74, or Male], 2 points each if present [Age  > 75, or Stroke/TIA/TE]  Has documented history of PAF, on Eliquis outpatient, continue same.   4. Chronic kidney disease stage IV: renal following, plan for AVF on 10/30.   Signed, ErArbutus LeasNP 07/06/2016, 1:05 PM Pager: [phone] seen and examined and history reviewed. Agree with above findings and plan. [age] WM with history of chronic diastolic CHF, CKD stage 4 admitted with acute respiratory failure. This occurred after eating a high sodium meal yesterday. [facility] and was doing well. He has a history of PAF.  On exam he is in no distress. JVD is elevated. Bibasilar rales. CV IRR without murmur or gallop. 1+ ankle edema.  Labs, Ecg, CXR reviewed.  Impression: acute on chronic diastolic CHF due to dietary indiscretion. Patient has a harder time clearing sodium load with advanced renal failure. Stressed importance of sodium restriction. Will diurese with IV lasix 160 mg every 8 hours today and reassess in am. Follow renal function closely. No further ischemic work up needed. Telemetry reviewed and shows multifocal atrial rhythm (multiple P wave morphologies).   Dalyn Becker [email or web address]/23/2017 3:09 PM

## 2016-07-06 NOTE — Progress Notes (Signed)
PULMONARY / CRITICAL CARE MEDICINE   Name: ADNREW HONKALA MRN: [id number] DOB: [birth year]    ADMISSION DATE:  07/06/2016 CONSULTATION DATE:    [name] MD:  [facility]  CHIEF COMPLAINT: shortness of breath  HISTORY OF PRESENT ILLNESS:   Mr. Mccalip is a 75M with PMH significant for HLD, HTN, CAD s/p PCI and CABG, HFpEF, CKD IV undergoing evaluation for fistula placement, and DM who presented to an outside hospital with complaints of shortness of breath for several hours. In transit he was placed on CPAP. Shortly after arrival to the ED he became unresponsive and was intubated.   Labs from the outside hospital showed elevated WBC at 16.5 with left shift. Creat 3.3, BUN 77, K 5.4, CO2 24, Glucose 592, LFTs within normal limits, BNP 17600, troponin 0.08, INR 1.1, PTT 30.6, initial post-intubation ABG 7.280 / 56 / 70 / 28. Lactate 1.2.   Son reports that his complaints began acutely. He was doing pretty well and became acutely short of breath. This was similar to prior heart failure exacerbations. He may have been running a low grade fever. No report of cough / sputum / hemoptysis leading up to his acute presentation. Lower extremity swelling is moderate for him.   SUBJECTIVE:  Intubated overnight and transferred to [facility] for further management.  VITAL SIGNS: BP (!) 191/79   Pulse 93   Temp 97.4 F (36.3 C) (Oral)   Resp (!) 21   Ht 5\' 8"  (1.727 m)   Wt 83 kg (182 lb 15.7 oz)   SpO2 100%   BMI 27.82 kg/m   HEMODYNAMICS:    VENTILATOR SETTINGS: Vent Mode: PRVC FiO2 (%):  [50 %] 50 % Set Rate:  [14 bmp] 14 bmp Vt Set:  [550 mL] 550 mL PEEP:  [5 cmH20] 5 cmH20 Plateau Pressure:  [17 cmH20-18 cmH20] 17 cmH20  INTAKE / OUTPUT: I/O last 3 completed shifts: In: 149.7 [I.V.:83.7; IV Piggyback:66] Out: 575 [Urine:575]  PHYSICAL EXAMINATION:  General Well nourished, well developed, intubated and following all commands  HEENT No gross abnormalities. OETT / OGT in place.    Pulmonary Coarse breath sounds bilaterally with no wheezes. Vent-assisted effort, symmetrical expansion.   Cardiovascular Normal rate, irregular rhythm. S1, s2. No m/r/g. Distal pulses palpable.  Abdomen Soft, non-tender, non-distended, positive bowel sounds, no palpable organomegaly or masses. Normoresonant to percussion.  Musculoskeletal Grossly normal  Lymphatics No cervical, supraclavicular or axillary adenopathy.   Neurologic Exam limited by sedation. PERRL.   Skin/Integuement No rash, no cyanosis, no clubbing. 1+ edema bilateral lower extremities to knees.    LABS:  BMET  Recent Labs Lab 07/06/16 0427  NA 132*  K 4.8  CL 96*  CO2 24  BUN 80*  CREATININE 3.59*  GLUCOSE 513*   Electrolytes  Recent Labs Lab 07/06/16 0427  CALCIUM 8.9  MG 2.0  PHOS 4.0   CBC No results for input(s): WBC, HGB, HCT, PLT in the last 168 hours.  Coag's No results for input(s): APTT, INR in the last 168 hours.  Sepsis Markers  Recent Labs Lab 07/06/16 0427 07/06/16 0758  LATICACIDVEN 1.5 2.0*  PROCALCITON 13.90  --     ABG  Recent Labs Lab 07/06/16 0430  PHART 7.354  PCO2ART 46.5  PO2ART 120*    Liver Enzymes No results for input(s): AST, ALT, ALKPHOS, BILITOT, ALBUMIN in the last 168 hours.  Cardiac Enzymes  Recent Labs Lab 07/06/16 0427  TROPONINI 0.25*    Glucose  Recent Labs Lab  07/06/16 0338 07/06/16 0801 07/06/16 0929  GLUCAP 525* 424* 440*    Imaging Dg Chest Port 1 View  Result Date: 07/06/2016 CLINICAL DATA:  75 year old male with respiratory failure. EXAM: PORTABLE CHEST 1 VIEW COMPARISON:  Chest radiograph dated 07/05/2016 and CT dated 01/09/2016 FINDINGS: Endotracheal tube above the carina in stable positioning. Enteric tube courses into the left upper abdomen with tip beyond the inferior margin of the image. There is cardiomegaly moderate with mild interstitial prominence. An area of hazy density involving the right mid to lower lung field  is concerning for infiltrates. Clinical correlation and follow-up recommended. The left lung is clear. No pleural effusion or pneumothorax. Median sternotomy wires and CABG vascular clips noted. IMPRESSION: Right mid to lower lung field hazy density concerning for infiltrate. Clinical correlation follow-up recommended. Moderate cardiomegaly. Electronically Signed   By: Anner Crete M.D.   On: 07/06/2016 06:19   CXR pending  STUDIES:    CULTURES: Blood cx pending  ANTIBIOTICS: Rocephin 10/22 >> Azithromycin 10/22 >>  SIGNIFICANT EVENTS:   LINES/TUBES: OETT 10/22>>>10/23 OGT 10/22>>>10/23 Foley 10/22>>> PIV  DISCUSSION: Mr. Lumbert is a 75M with PMH significant for HFpEF, CKD IV planning to start dialysis, IDDM, HTN, HLD, CAD s/p PCI and CABG, and PVD, who presented to an outside ED with acute onset shortness of breath. He was also found to have an elevated white count, fever to 101.3, and the outside physicians were concerned for CAP. He was given 80mg  lasix with little response (500cc), rocephin, azithro, and 125mg  of solumedrol. He is also markedly hyperglycemic despite 22u insulin at the OSH.   ASSESSMENT / PLAN:  PULMONARY A: Acute hypoxemic/hypercapneic respiratory failure - likely 2/2 acute decompensated CHF +/- CAP P:   SBT to extubate now. OOB to chair. IS per RT protocol. PT evaluation.  CARDIOVASCULAR A:  Troponin leak (0.08)  Atrial fibrillation - rate controlled - on Eliquis Hx HTN Hx CAD s/p PCI and CABG Hx PVD P:  PRN hydralazine. TTE in AM. Confirm home medications and continue rate control and antihypertensives. Cardiology consult called.  RENAL A:   CKD IV Mild hyperkalemia P:   Additional dose of Lasix 80 mg IV x1 ordered Kayexelate given BMET in AM May require HD access this admission, call renal consult for ?HD Replace electrolytes as indicated  GASTROINTESTINAL A:   No acute issues P:   Heart healthy carb modified diet Stress  ulcer ppx  HEMATOLOGIC A:   Leukocytosis, chronic anemia P:  Trend CBC On Eliquis for a/f - no dvt ppx necessary at this time  INFECTIOUS A:   Concern for CAP P:   Continue Rocephin / Azithro Blood cultures UA CXR  ENDOCRINE A:   IDDM P:   CBG and SSI Insulin gtt  NEUROLOGIC A:   No acute issues P:   RASS goal: 0 to -1 Monitor  FAMILY  - Updates: Wife updated bedside.  - Inter-disciplinary family meet or Palliative Care meeting due by:  day 7  The patient is critically ill with multiple organ systems failure and requires high complexity decision making for assessment and support, frequent evaluation and titration of therapies, application of advanced monitoring technologies and extensive interpretation of multiple databases.   Critical Care Time devoted to patient care services described in this note is  45  Minutes. This time reflects time of care of this signee Dr Jennet Maduro. This critical care time does not reflect procedure time, or teaching time or supervisory time of PA/NP/Med student/Med  Resident etc but could involve care discussion time.  Rush Farmer, M.D. [facility] Pulmonary/Critical Care Medicine. Pager: [phone]. After hours pager: [phone].  07/06/2016, 10:46 AM

## 2016-07-06 NOTE — H&P (Signed)
PULMONARY / CRITICAL CARE MEDICINE   Name: Kyle [name] MRN: [id number] DOB: 1941/06/18    ADMISSION DATE:  07/06/2016 CONSULTATION DATE:    Kyle Chance MD:  K[name]  CHIEF COMPLAINT: shortness of breath  HISTORY OF PRESENT ILLNESS:   Kyle [name] is a 54M with PMH significant for HLD, HTN, CAD s/p PCI and CABG, HFpEF, CKD IV undergoing evaluation for fistula placement, and DM who presented to an outside hospital with complaints of shortness of breath for several hours. In transit he was placed on CPAP. Shortly after arrival to the ED he became unresponsive and was intubated.   Labs from the outside hospital showed elevated WBC at 16.5 with left shift. Creat 3.3, BUN 77, K 5.4, CO2 24, Glucose 592, LFTs within normal limits, BNP 17600, troponin 0.08, INR 1.1, PTT 30.6, initial post-intubation ABG 7.280 / 56 / 70 / 28. Lactate 1.2.   Son reports that his complaints began acutely. He was doing pretty well and became acutely short of breath. This was similar to prior heart failure exacerbations. He may have been running a low grade fever. No report of cough / sputum / hemoptysis leading up to his acute presentation. Lower extremity swelling is moderate for him.   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  PAST SURGICAL HISTORY: He  has a past surgical history that includes Tonsillectomy; Adenoidectomy; Cardiac bypass; Coronary artery bypass graft; Cardiac  catheterization; Eye surgery; and Cardiac catheterization (N/A, 03/24/2016).  Allergies  Allergen Reactions  . Statins Other (See Comments)    Pt has tried 4-5 diff kinds. 03/20/2016 Pt reports no reaction or allergy, but has tried different types of statins to figure out which works best for him.     No current facility-administered medications on file prior to encounter.    Current Outpatient Prescriptions on File Prior to Encounter  Medication Sig  . allopurinol (ZYLOPRIM) 100 MG tablet Take 100 mg by mouth daily.  Marland Kitchen amLODipine (NORVASC) 5 MG tablet Take 1 tablet (5 mg total) by mouth daily.  Marland Kitchen apixaban (ELIQUIS) 2.5 MG TABS tablet Take 2.5 mg by mouth 2 (two) times daily.  Marland Kitchen aspirin EC 81 MG EC tablet Take 1 tablet (81 mg total) by mouth daily.  . Calcium Carb-Cholecalciferol (CALTRATE 600+D3 SOFT PO) Take 1 tablet by mouth daily.  . carvedilol (COREG) 25 MG tablet Take 25 mg by mouth 2 (two) times daily with a meal.  . cloNIDine (CATAPRES - DOSED IN MG/24 HR) 0.2 mg/24hr patch Place 1 patch (0.2 mg total) onto the skin once a week.  . clopidogrel (PLAVIX) 75 MG tablet Take 1 tablet (75 mg total) by mouth daily.  . cyanocobalamin (,VITAMIN B-12,) 1000 MCG/ML injection Inject 1,000 mcg into the muscle every 30 (thirty) days.  . ferrous sulfate 325 (65 FE) MG tablet Take 325 mg by mouth daily with breakfast.  . fexofenadine (ALLEGRA) 180 MG tablet Take 180 mg by mouth daily.  . hydrALAZINE (APRESOLINE) 50 MG tablet Take 50 mg by mouth 3 (three) times daily.  . insulin aspart (NOVOLOG) 100 UNIT/ML injection Inject 5 Units  into the skin See admin instructions. Per sliding scale  . insulin NPH Human (NOVOLIN N) 100 UNIT/ML injection Inject 35 Units into the skin 2 (two) times daily.  . isosorbide mononitrate (IMDUR) 60 MG 24 hr tablet Take 60 mg by mouth daily.  . Mupirocin (BACTROBAN EX) Apply 1 application topically daily as needed (irritation).  Marland Kitchen omeprazole (PRILOSEC) 20 MG capsule Take  20 mg by mouth daily.  Marland Kitchen oxyCODONE (OXY IR/ROXICODONE) 5 MG immediate release tablet Take 5 mg by mouth every 6 (six) hours as needed for moderate pain or severe pain.  . Probiotic Product (PROBIOTIC MULTI-ENZYME PO) Take 1 tablet by mouth daily.  . ranolazine (RANEXA) 500 MG 12 hr tablet Take 1 tablet (500 mg total) by mouth 2 (two) times daily.  Marland Kitchen rOPINIRole (REQUIP) 1 MG tablet Take 1 mg by mouth at bedtime as needed (RLS).  Marland Kitchen rosuvastatin (CRESTOR) 20 MG tablet Take 20 mg by mouth daily.  . tamsulosin (FLOMAX) 0.4 MG CAPS capsule Take 0.4 mg by mouth at bedtime.  . torsemide (DEMADEX) 20 MG tablet Take 2 tablets (40 mg total) by mouth 2 (two) times daily.  Marland Kitchen Ubiquinol 50 MG CAPS Take 1 capsule by mouth daily.    FAMILY HISTORY:  His indicated that the status of his mother is unknown. He indicated that the status of his father is unknown. He indicated that the status of his sister is unknown. He indicated that the status of his brother is unknown.    SOCIAL HISTORY: He  reports that he quit smoking about 60 years ago. He has never used smokeless tobacco. He reports that he does not drink alcohol or use drugs.  REVIEW OF SYSTEMS:   Unable to obtain 2/2 intubated and sedated state  SUBJECTIVE:    VITAL SIGNS: Temp 97.6 F (36.4 C) (Axillary)   HEMODYNAMICS:    VENTILATOR SETTINGS:    INTAKE / OUTPUT: No intake/output data recorded.  PHYSICAL EXAMINATION:  General Well nourished, well developed, intubated and sedated  HEENT No gross abnormalities. OETT / OGT in place.   Pulmonary Coarse breath sounds bilaterally with no wheezes. Vent-assisted effort, symmetrical expansion.   Cardiovascular Normal rate, irregular rhythm. S1, s2. No m/r/g. Distal pulses palpable.  Abdomen Soft, non-tender, non-distended, positive bowel sounds, no palpable organomegaly or masses. Normoresonant to percussion.  Musculoskeletal Grossly normal  Lymphatics No cervical, supraclavicular or axillary  adenopathy.   Neurologic Exam limited by sedation. PERRL.   Skin/Integuement No rash, no cyanosis, no clubbing. 1+ edema bilateral lower extremities to knees.     LABS:  BMET No results for input(s): NA, K, CL, CO2, BUN, CREATININE, GLUCOSE in the last 168 hours.  Electrolytes No results for input(s): CALCIUM, MG, PHOS in the last 168 hours.  CBC No results for input(s): WBC, HGB, HCT, PLT in the last 168 hours.  Coag's No results for input(s): APTT, INR in the last 168 hours.  Sepsis Markers No results for input(s): LATICACIDVEN, PROCALCITON, O2SATVEN in the last 168 hours.  ABG No results for input(s): PHART, PCO2ART, PO2ART in the last 168 hours.  Liver Enzymes No results for input(s): AST, ALT, ALKPHOS, BILITOT, ALBUMIN in the last 168 hours.  Cardiac Enzymes No results for input(s): TROPONINI, PROBNP in the last 168 hours.  Glucose  Recent Labs Lab 07/06/16 0338  GLUCAP 525*    Imaging No results found. CXR pending  STUDIES:    CULTURES: Blood cx pending  ANTIBIOTICS: Rocephin 10/22 >> Azithromycin 10/22 >>  SIGNIFICANT  EVENTS:   LINES/TUBES: OETT 10/22  OGT 10/22 Foley 10/22 PIV  DISCUSSION: Mr. Samuelsen is a 21M with PMH significant for HFpEF, CKD IV planning to start dialysis, IDDM, HTN, HLD, CAD s/p PCI and CABG, and PVD, who presented to an outside ED with acute onset shortness of breath. He was also found to have an elevated white count, fever to 101.3, and the outside physicians were concerned for CAP. He was given 80mg  lasix with little response (500cc), rocephin, azithro, and 125mg  of solumedrol. He is also markedly hyperglycemic despite 22u insulin at the OSH.   ASSESSMENT / PLAN:  PULMONARY A: Acute hypoxemic/hypercapneic respiratory failure - likely 2/2 acute decompensated CHF +/- CAP P:   Continue ventilatory support Wean as tolerated Diurese Continue antibiotics SBT when able Discuss with wife regarding code status /  re-intubation  CARDIOVASCULAR A:  Troponin leak (0.08)  Atrial fibrillation - rate controlled - on Eliquis Hx HTN Hx CAD s/p PCI and CABG Hx PVD P:  Trend troponin TTE in AM Confirm home medications and continue rate control and antihypertensives  RENAL A:   CKD IV Mild hyperkalemia P:   Trial of Lasix 160mg  iv  Kayexelate Check UA May require HD access this admission  GASTROINTESTINAL A:   No acute issues P:   NPO Stress ulcer ppx  HEMATOLOGIC A:   Leukocytosis, chronic anemia P:  Trend CBC On Eliquis for a/c - no dvt ppx necessary at this time  INFECTIOUS A:   Concern for CAP P:   Continue Rocephin / Azithro Check PCT Blood cultures UA CXR  ENDOCRINE A:   IDDM P:   CBG and SSI May need insulin gtt  NEUROLOGIC A:   No acute issues P:   RASS goal: 0 to -1 Monitor   FAMILY  - Updates: Son at bedside. Patient reportedly has a MOST form, but son is unsure of what is says and asks that we discuss code status with the patient's wife tomorrow. For now he will remain FULL CODE.  - Inter-disciplinary family meet or Palliative Care meeting due by:  day 7  The patient is critically ill with multiple organ system failure and requires high complexity decision making for assessment and support, frequent evaluation and titration of therapies, advanced monitoring, review of radiographic studies and interpretation of complex data.   Critical Care Time devoted to patient care services, exclusive of separately billable procedures, described in this note is 56 minutes.   Yisroel Ramming, MD Pulmonary and [place] Pager: [phone]  07/06/2016, 3:49 AM

## 2016-07-07 ENCOUNTER — Inpatient Hospital Stay (HOSPITAL_COMMUNITY): Payer: PPO

## 2016-07-07 DIAGNOSIS — R739 Hyperglycemia, unspecified: Secondary | ICD-10-CM

## 2016-07-07 DIAGNOSIS — N184 Chronic kidney disease, stage 4 (severe): Secondary | ICD-10-CM | POA: Diagnosis not present

## 2016-07-07 DIAGNOSIS — J9621 Acute and chronic respiratory failure with hypoxia: Secondary | ICD-10-CM | POA: Diagnosis not present

## 2016-07-07 DIAGNOSIS — J9622 Acute and chronic respiratory failure with hypercapnia: Secondary | ICD-10-CM

## 2016-07-07 DIAGNOSIS — I5033 Acute on chronic diastolic (congestive) heart failure: Secondary | ICD-10-CM

## 2016-07-07 DIAGNOSIS — R778 Other specified abnormalities of plasma proteins: Secondary | ICD-10-CM | POA: Diagnosis present

## 2016-07-07 DIAGNOSIS — R7989 Other specified abnormal findings of blood chemistry: Secondary | ICD-10-CM

## 2016-07-07 DIAGNOSIS — R0600 Dyspnea, unspecified: Secondary | ICD-10-CM

## 2016-07-07 LAB — BASIC METABOLIC PANEL
ANION GAP: 14 (ref 5–15)
ANION GAP: 15 (ref 5–15)
ANION GAP: 17 — AB (ref 5–15)
Anion gap: 14 (ref 5–15)
BUN: 88 mg/dL — ABNORMAL HIGH (ref 6–20)
BUN: 92 mg/dL — AB (ref 6–20)
BUN: 99 mg/dL — AB (ref 6–20)
BUN: 104 mg/dL — AB (ref 6–20)
CHLORIDE: 96 mmol/L — AB (ref 101–111)
CO2: 26 mmol/L (ref 22–32)
CO2: 23 mmol/L (ref 22–32)
CO2: 22 mmol/L (ref 22–32)
CO2: 27 mmol/L (ref 22–32)
Calcium: 9.2 mg/dL (ref 8.9–10.3)
Calcium: 9.1 mg/dL (ref 8.9–10.3)
Calcium: 9.1 mg/dL (ref 8.9–10.3)
Calcium: 9.3 mg/dL (ref 8.9–10.3)
Chloride: 97 mmol/L — ABNORMAL LOW (ref 101–111)
Chloride: 94 mmol/L — ABNORMAL LOW (ref 101–111)
Chloride: 94 mmol/L — ABNORMAL LOW (ref 101–111)
Creatinine, Ser: 3.32 mg/dL — ABNORMAL HIGH (ref 0.61–1.24)
Creatinine, Ser: 3.25 mg/dL — ABNORMAL HIGH (ref 0.61–1.24)
Creatinine, Ser: 3.28 mg/dL — ABNORMAL HIGH (ref 0.61–1.24)
Creatinine, Ser: 3.46 mg/dL — ABNORMAL HIGH (ref 0.61–1.24)
GFR, EST AFRICAN AMERICAN: 19 mL/min — AB (ref >60)
GFR, EST AFRICAN AMERICAN: 20 mL/min — AB (ref >60)
GFR, EST AFRICAN AMERICAN: 20 mL/min — AB (ref >60)
GFR, EST AFRICAN AMERICAN: 18 mL/min — AB (ref >60)
GFR, EST NON AFRICAN AMERICAN: 17 mL/min — AB (ref >60)
GFR, EST NON AFRICAN AMERICAN: 17 mL/min — AB (ref >60)
GFR, EST NON AFRICAN AMERICAN: 17 mL/min — AB (ref >60)
GFR, EST NON AFRICAN AMERICAN: 16 mL/min — AB (ref >60)
Glucose, Bld: 184 mg/dL — ABNORMAL HIGH (ref 65–99)
Glucose, Bld: 244 mg/dL — ABNORMAL HIGH (ref 65–99)
Glucose, Bld: 242 mg/dL — ABNORMAL HIGH (ref 65–99)
Glucose, Bld: 268 mg/dL — ABNORMAL HIGH (ref 65–99)
POTASSIUM: 4.3 mmol/L (ref 3.5–5.1)
POTASSIUM: 4.3 mmol/L (ref 3.5–5.1)
POTASSIUM: 4.4 mmol/L (ref 3.5–5.1)
POTASSIUM: 4.1 mmol/L (ref 3.5–5.1)
SODIUM: 136 mmol/L (ref 135–145)
SODIUM: 135 mmol/L (ref 135–145)
SODIUM: 133 mmol/L — AB (ref 135–145)
SODIUM: 135 mmol/L (ref 135–145)

## 2016-07-07 LAB — GLUCOSE, CAPILLARY
GLUCOSE-CAPILLARY: 153 mg/dL — AB (ref 65–99)
GLUCOSE-CAPILLARY: 176 mg/dL — AB (ref 65–99)
GLUCOSE-CAPILLARY: 203 mg/dL — AB (ref 65–99)
GLUCOSE-CAPILLARY: 199 mg/dL — AB (ref 65–99)
GLUCOSE-CAPILLARY: 252 mg/dL — AB (ref 65–99)
GLUCOSE-CAPILLARY: 231 mg/dL — AB (ref 65–99)
Glucose-Capillary: 160 mg/dL — ABNORMAL HIGH (ref 65–99)
Glucose-Capillary: 159 mg/dL — ABNORMAL HIGH (ref 65–99)
Glucose-Capillary: 178 mg/dL — ABNORMAL HIGH (ref 65–99)
Glucose-Capillary: 204 mg/dL — ABNORMAL HIGH (ref 65–99)
Glucose-Capillary: 201 mg/dL — ABNORMAL HIGH (ref 65–99)
Glucose-Capillary: 122 mg/dL — ABNORMAL HIGH (ref 65–99)
Glucose-Capillary: 239 mg/dL — ABNORMAL HIGH (ref 65–99)

## 2016-07-07 LAB — CBC
HCT: 30.5 % — ABNORMAL LOW (ref 39.0–52.0)
HEMOGLOBIN: 10.4 g/dL — AB (ref 13.0–17.0)
MCH: 29.8 pg (ref 26.0–34.0)
MCHC: 34.1 g/dL (ref 30.0–36.0)
MCV: 87.4 fL (ref 78.0–100.0)
Platelets: 285 10*3/uL (ref 150–400)
RBC: 3.49 MIL/uL — AB (ref 4.22–5.81)
RDW: 15.3 % (ref 11.5–15.5)
WBC: 17.3 10*3/uL — ABNORMAL HIGH (ref 4.0–10.5)

## 2016-07-07 LAB — PHOSPHORUS
PHOSPHORUS: 6.6 mg/dL — AB (ref 2.5–4.6)
Phosphorus: 7 mg/dL — ABNORMAL HIGH (ref 2.5–4.6)

## 2016-07-07 LAB — LACTIC ACID, PLASMA: Lactic Acid, Venous: 1.5 mmol/L (ref 0.5–1.9)

## 2016-07-07 LAB — MAGNESIUM: Magnesium: 2.1 mg/dL (ref 1.7–2.4)

## 2016-07-07 MED ORDER — INSULIN ASPART 100 UNIT/ML ~~LOC~~ SOLN
0.0000 [IU] | Freq: Every day | SUBCUTANEOUS | Status: DC
  Administered 2016-07-07: 3 [IU] via SUBCUTANEOUS

## 2016-07-07 MED ORDER — PANTOPRAZOLE SODIUM 40 MG PO TBEC
40.0000 mg | DELAYED_RELEASE_TABLET | Freq: Every day | ORAL | Status: DC
  Administered 2016-07-07 – 2016-07-09 (×4): 40 mg via ORAL
  Filled 2016-07-07 (×4): qty 1

## 2016-07-07 MED ORDER — INSULIN ASPART 100 UNIT/ML ~~LOC~~ SOLN
4.0000 [IU] | Freq: Three times a day (TID) | SUBCUTANEOUS | Status: DC
  Administered 2016-07-07 – 2016-07-09 (×5): 4 [IU] via SUBCUTANEOUS

## 2016-07-07 MED ORDER — INSULIN ASPART 100 UNIT/ML ~~LOC~~ SOLN
0.0000 [IU] | Freq: Three times a day (TID) | SUBCUTANEOUS | Status: DC
  Administered 2016-07-07: 7 [IU] via SUBCUTANEOUS
  Administered 2016-07-08 – 2016-07-09 (×2): 4 [IU] via SUBCUTANEOUS

## 2016-07-07 MED ORDER — CALCIUM CARBONATE 1250 (500 CA) MG PO TABS
1.0000 | ORAL_TABLET | Freq: Every day | ORAL | Status: DC
  Administered 2016-07-07: 500 mg via ORAL
  Filled 2016-07-07: qty 1

## 2016-07-07 MED ORDER — INSULIN DETEMIR 100 UNIT/ML ~~LOC~~ SOLN
20.0000 [IU] | Freq: Two times a day (BID) | SUBCUTANEOUS | Status: DC
Start: 2016-07-07 — End: 2016-07-09
  Administered 2016-07-07 – 2016-07-09 (×5): 20 [IU] via SUBCUTANEOUS
  Filled 2016-07-07 (×8): qty 0.2

## 2016-07-07 NOTE — Progress Notes (Signed)
Admit: 07/06/2016 LOS: 1  56M with CKD4, acute resp failure 2/2 CHF and CAP  Subjective:  UOP 3.8L on q6h lasix 160mg  IV Remains on CTX + Azithro 2L [place] good SpO2 SCr stable, K repleted On insulin gtt    10/23 0701 - 10/24 0700 In: 944.5 [I.V.:406.5; IV Piggyback:498] Out: [id number] [Urine:3780]  Filed Weights   07/06/16 0335 07/07/16 0500  Weight: 83 kg (182 lb 15.7 oz) 80.5 kg (177 lb 7.5 oz)    Scheduled Meds: . apixaban  2.5 mg Oral BID  . azithromycin  500 mg Intravenous Q24H  . calcium carbonate  1 tablet Oral Q breakfast  . cefTRIAXone (ROCEPHIN)  IV  1 g Intravenous Q24H  . cloNIDine  0.2 mg Transdermal Weekly  . fentaNYL (SUBLIMAZE) injection  50 mcg Intravenous Once  . furosemide  160 mg Intravenous Q6H  . pantoprazole  40 mg Oral Daily   Continuous Infusions: . fentaNYL infusion INTRAVENOUS Stopped (07/06/16 1110)  . insulin (NOVOLIN-R) infusion 4.2 Units/hr (07/07/16 0631)  . propofol (DIPRIVAN) infusion Stopped (07/06/16 1115)   PRN Meds:.sodium chloride, fentaNYL, hydrALAZINE  Current Labs: reviewed    Physical Exam:  Blood pressure 136/66, pulse (!) 45, temperature 97.7 F (36.5 C), temperature source Oral, resp. rate 12, height 5\' 8"  (1.727 m), weight 80.5 kg (177 lb 7.5 oz), SpO2 97 %. GEN: NAD, conversant, pleasant ENT: NCAT EYES: EOMI CV: IRIR PULM: diminished throughout,  ABD: s/n/d, obese SKIN: no rashes/lesiosn [id number] to 1+ LEE  A 1. Stable CKD4, BL SCr mid 3s; planning for iHD with AVF scheduled 10/30; Patel MD 2. Resp Failure, now extubated 3. CAP, fever on CTX+Azithro 4. CAD s/p CABG 5. HTN on 5 agents 6. dHF exacerbation 7. DM2 on insluin 8. AFib on NOAC 9. BPH on  10. Hypokalemia 2/2 diuretics 11. Anemia on outpt ESA  Plan 1. No RRT necessary currently 2. Cont supportive care, resume BP meds as able if BP high 3. Daily weights, Daily Renal Panel, Strict I/Os, Avoid nephrotoxins (NSAIDs, judicious IV Contrast)  4. WIll cont  to follow closely 5. Remove IVs from [facility] veins; try to use hand veins   Pearson Grippe MD 07/07/2016, 7:59 AM   Recent Labs Lab 07/06/16 0427 07/06/16 1409 07/06/16 1712 07/06/16 2156 07/07/16 0501 07/07/16 0504  NA 132* 139 138 136 136  --   K 4.8 3.7 3.6 3.3* 4.3  --   CL 96* 99* 99* 97* 96*  --   CO2 24 24 24 23 26   --   GLUCOSE 513* 209* 131* 134* 184*  --   BUN 80* 81* 80* 84* 88*  --   CREATININE 3.59* 3.63* 3.33* 3.22* 3.32*  --   CALCIUM 8.9 10.0 10.2 9.6 9.2  --   PHOS 4.0 2.4*  --   --   --  7.0*    Recent Labs Lab 07/07/16 0504  WBC 17.3*  HGB 10.4*  HCT 30.5*  MCV 87.4  PLT 285

## 2016-07-07 NOTE — Progress Notes (Signed)
Inpatient Diabetes Program Recommendations  AACE/ADA: New Consensus Statement on Inpatient Glycemic Control (2015)  Target Ranges:  Prepandial:   less than 140 mg/dL      Peak postprandial:   less than 180 mg/dL (1-2 hours)      Critically ill patients:  140 - 180 mg/dL   Lab Results  Component Value Date   GLUCAP 252 (H) 07/07/2016   HGBA1C 10.4 (H) 03/19/2016    Review of Glycemic Control:  Results for Kyle [name], Kyle [name] (MRN [id number]) as of 07/07/2016 11:06  Ref. Range 07/07/2016 05:27 07/07/2016 06:31 07/07/2016 08:07 07/07/2016 08:59 07/07/2016 10:04  Glucose-Capillary Latest Ref Range: 65 - 99 mg/dL 203 (H) 199 (H) 178 (H) 204 (H) 252 (H)    Diabetes history: Type 2 diabetes Outpatient Diabetes medications: NPH 30 units bid Current orders for Inpatient glycemic control:  IV insulin- pt. Is now on PO diet  Inpatient Diabetes Program Recommendations:    Please consider transition off insulin drip to Levemir 20 units bid.  Also consider adding Novolog meal coverage 4 units tid with meals and moderate Novolog correction tid with meals and HS.   Thanks, Adah Perl, RN, BC-ADM Inpatient Diabetes Coordinator Pager [phone] (8a-5p)

## 2016-07-07 NOTE — Progress Notes (Deleted)
PULMONARY / CRITICAL CARE MEDICINE   Name: Kyle [name] MRN: [id number] DOB: 1941/06/08    ADMISSION DATE:  07/06/2016 CONSULTATION DATE:  10/23  REFERRING MD:  [facility]  CHIEF COMPLAINT:  Shortness of Breath   HISTORY OF PRESENT ILLNESS:   Kyle [name] is a 25M with PMH significant for HLD, HTN, CAD s/p PCI and CABG, HFpEF, CKD IV undergoing evaluation for fistula placement, and DM who presented to an outside hospital with complaints of shortness of breath for several hours. In transit he was placed on CPAP. Shortly after arrival to the ED he became unresponsive and was intubated.   Labs from the outside hospital showed elevated WBC at 16.5 with left shift. Creat 3.3, BUN 77, K 5.4, CO2 24, Glucose 592, LFTs within normal limits, BNP 17600, troponin 0.08, INR 1.1, PTT 30.6, initial post-intubation ABG 7.280 / 56 / 70 / 28. Lactate 1.2.   Son reports that his complaints began acutely. He was doing pretty well and became acutely short of breath. This was similar to prior heart failure exacerbations. He may have been running a low grade fever. No report of cough / sputum / hemoptysis leading up to his acute presentation. Lower extremity swelling is moderate for him.   PAST MEDICAL HISTORY :  He  has a past medical history of Atrial fibrillation ([place]); BPH (benign prostatic hyperplasia); CAD (coronary artery disease); Chronic systolic (congestive) heart failure; CKD (chronic kidney disease), stage IV (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  PAST SURGICAL HISTORY: He  has a past surgical history that includes Tonsillectomy; Adenoidectomy; Cardiac bypass; Coronary artery bypass graft; Cardiac  catheterization; Eye surgery; and Cardiac catheterization (N/A, 03/24/2016).  Allergies  Allergen Reactions  . Statins Other (See Comments)    Pt has tried 4-5 diff kinds. 03/20/2016 Pt reports no reaction or allergy, but has tried different types of statins to figure out which works best for him.     No current facility-administered medications on file prior to encounter.    Current Outpatient Prescriptions on File Prior to Encounter  Medication Sig  . allopurinol (ZYLOPRIM) 100 MG tablet Take 100 mg by mouth daily.  Marland Kitchen amLODipine (NORVASC) 5 MG tablet Take 1 tablet (5 mg total) by mouth daily.  Marland Kitchen apixaban (ELIQUIS) 2.5 MG TABS tablet Take 2.5 mg by mouth 2 (two) times daily.  . carvedilol (COREG) 25 MG tablet Take 25 mg by mouth 2 (two) times daily with a meal.  . cloNIDine (CATAPRES - DOSED IN MG/24 HR) 0.2 mg/24hr patch Place 1 patch (0.2 mg total) onto the skin once a week.  . clopidogrel (PLAVIX) 75 MG tablet Take 1 tablet (75 mg total) by mouth daily.  . cyanocobalamin (,VITAMIN B-12,) 1000 MCG/ML injection Inject 1,000 mcg into the muscle every 30 (thirty) days.  . hydrALAZINE (APRESOLINE) 50 MG tablet Take 50 mg by mouth 3 (three) times daily.  . insulin aspart (NOVOLOG) 100 UNIT/ML injection Inject 15-30 Units into the skin 3 (three) times daily as needed for high blood sugar. Per sliding scale   . insulin NPH Human (NOVOLIN N) 100 UNIT/ML injection Inject 30 Units into the skin 2 (two) times daily.   . isosorbide mononitrate (IMDUR) 60 MG 24 hr tablet Take 60 mg by mouth daily.  Marland Kitchen omeprazole (PRILOSEC) 20  MG capsule Take 20 mg by mouth daily.  Marland Kitchen oxyCODONE (OXY IR/ROXICODONE) 5 MG immediate release tablet Take 5 mg by mouth every 6 (six) hours as needed for moderate pain or severe pain.  . ranolazine (RANEXA) 500 MG 12 hr tablet Take 1 tablet (500 mg total) by mouth 2 (two) times daily.  Marland Kitchen rOPINIRole (REQUIP) 1 MG tablet Take 1 mg by mouth at bedtime as needed (RLS).  Marland Kitchen rosuvastatin  (CRESTOR) 20 MG tablet Take 20 mg by mouth daily.  . tamsulosin (FLOMAX) 0.4 MG CAPS capsule Take 0.8 mg by mouth at bedtime.   . torsemide (DEMADEX) 20 MG tablet Take 2 tablets (40 mg total) by mouth 2 (two) times daily.  Marland Kitchen aspirin EC 81 MG EC tablet Take 1 tablet (81 mg total) by mouth daily.  . Calcium Carb-Cholecalciferol (CALTRATE 600+D3 SOFT PO) Take 1 tablet by mouth daily.  . ferrous sulfate 325 (65 FE) MG tablet Take 325 mg by mouth daily with breakfast.  . fexofenadine (ALLEGRA) 180 MG tablet Take 180 mg by mouth daily.  . Mupirocin (BACTROBAN EX) Apply 1 application topically daily as needed (irritation).  . Probiotic Product (PROBIOTIC MULTI-ENZYME PO) Take 1 tablet by mouth daily.  Marland Kitchen Ubiquinol 50 MG CAPS Take 50 mg by mouth daily.     FAMILY HISTORY:  His indicated that the status of his mother is unknown. He indicated that the status of his father is unknown. He indicated that the status of his sister is unknown. He indicated that the status of his brother is unknown.    SOCIAL HISTORY: He  reports that he quit smoking about 60 years ago. He has never used smokeless tobacco. He reports that he does not drink alcohol or use drugs.  REVIEW OF SYSTEMS:   Denies chest pain, shortness of breath, palpitations   SUBJECTIVE:  Noted to have elevated anion gap while on insulin drip. AG closed this AM. HTN has improved with SBPs between 105 to 140s. Afebrile over the past 24 hours  VITAL SIGNS: BP (!) 159/64   Pulse 97   Temp 99.8 F (37.7 C) (Oral)   Resp 19   Ht 5\' 8"  (1.727 m)   Wt 80.5 kg (177 lb 7.5 oz)   SpO2 94%   BMI 26.98 kg/m   HEMODYNAMICS:    VENTILATOR SETTINGS:    INTAKE / OUTPUT: I/O last 3 completed shifts: In: 1094.1 [I.V.:490.1; Other:40; IV Piggyback:564] Out: [id number] [Urine:4355]  PHYSICAL EXAMINATION: GEN: NAD HEENT: Atraumatic, normocephalic, neck supple, EOMI, sclera clear  CV: irregularly irregular, normal rate, no murmurs, rubs, or  gallops PULM: mildly coarse breath sounds but overall clear, normal effort, on Nasal canula 2L  ABD: Soft, nontender, nondistended, NABS, no organomegaly SKIN: No rash or cyanosis; warm and well-perfused EXTR: No lower extremity edema or calf tenderness PSYCH: Mood and affect euthymic, normal rate and volume of speech NEURO: Awake, alert, no focal deficits grossly, normal speech  LABS:  BMET  Recent Labs Lab 07/06/16 2156 07/07/16 0501 07/07/16 0925  NA 136 136 135  K 3.3* 4.3 4.3  CL 97* 96* 97*  CO2 23 26 23   BUN 84* 88* 92*  CREATININE 3.22* 3.32* 3.25*  GLUCOSE 134* 184* 244*    Electrolytes  Recent Labs Lab 07/06/16 0427 07/06/16 1409  07/06/16 2156 07/07/16 0501 07/07/16 0504 07/07/16 0925  CALCIUM 8.9 10.0  < > 9.6 9.2  --  9.1  MG 2.0  --   --   --   --  2.1  --   PHOS 4.0 2.4*  --   --   --  7.0* 6.6*  < > = values in this interval not displayed.  CBC  Recent Labs Lab 07/07/16 0504  WBC 17.3*  HGB 10.4*  HCT 30.5*  PLT 285    Coag's No results for input(s): APTT, INR in the last 168 hours.  Sepsis Markers  Recent Labs Lab 07/06/16 0427 07/06/16 0758 07/07/16 0925  LATICACIDVEN 1.5 2.0* 1.5  PROCALCITON 13.90  --   --     ABG  Recent Labs Lab 07/06/16 0430  PHART 7.354  PCO2ART 46.5  PO2ART 120*    Liver Enzymes  Recent Labs Lab 07/06/16 1409  ALBUMIN 3.3*    Cardiac Enzymes  Recent Labs Lab 07/06/16 0427 07/06/16 1409 07/06/16 1712  TROPONINI 0.25* 0.28* 0.33*    Glucose  Recent Labs Lab 07/07/16 0424 07/07/16 0527 07/07/16 0631 07/07/16 0807 07/07/16 0859 07/07/16 1004  GLUCAP 159* 203* 199* 178* 204* 252*    Imaging No results found.   STUDIES:  CXR 10/23: Right mid to lower lung field hazy density concerning for infiltrate. UA 10/23: no infectious process  CULTURES: MRSA PCR negative Blood cx 10/23- pending  ANTIBIOTICS: Rocephin 10/22 >> Azithromycin 10/22 >>  SIGNIFICANT  EVENTS: 10/23: admit  10/24: extubated  LINES/TUBES: OETT 10/22>>>10/23 OGT 10/22>>>10/23 Foley 10/22>>> PIV  DISCUSSION: Mr. Pilotti is a 35M with PMH significant for HFpEF, Atrial Fibrillation, CKD IV planning to start dialysis, IDDM, HTN, HLD, CAD s/p PCI and CABG, and PVD, who presented to an outside ED with acute onset shortness of breath, elevated white count, fever to 101.3. Acute hypoxemic/hypercapneic respiratory failure thought to be due to acute decompensated CHF with possible CAP. He was also markedly hyperglycemic on admission and was started on insulin gtt. Patient seems stable to transfer off ICU to stepdown.   ASSESSMENT / PLAN:  PULMONARY A: Acute hypoxemic/hypercapneic respiratory failure - likely 2/2 acute decompensated CHF +/- CAP, improving and extubated 10/23 P:   OOB to chair. IS per RT protocol PT evaluation  CARDIOVASCULAR A:  Troponin leak  Atrial fibrillation - rate controlled - on Eliquis Hx HTN- BP improving  Hx CAD s/p PCI and CABG Hx PVD P:  PRN hydralazine. TTE pending  Cardiology consulted appreciate reccs: increased Lasix 160mg  q 8 hr; f/u ECHO; no further ischemic workup needed Clonidine patch 0.2mg   Continue Eliquis   RENAL A:   CKD IV Mild hyperkalemia- resolved Hyperphosphatemia  Lactic Acidosis, mild  P:   Nephrology consulted, appreciate reccs: no RRT necessary currently,plan for iHD starting 10/30 Replace electrolytes as indicated Repeat Lactate Calcium Carbonate started today   GASTROINTESTINAL A:   No acute issues P:   Heart healthy carb modified diet Stress ulcer ppx  HEMATOLOGIC A:   Leukocytosis Chronic anemia- stable P:  Trend CBC Eliquis for a/f - no dvt ppx necessary at this time  INFECTIOUS A:   Concern for CAP P:   Continue Rocephin / Azithro Follow Blood cultures  ENDOCRINE A:   IDDM with significant hyperglycemia P:   CBG and SSI Insulin gtt  NEUROLOGIC A:   No acute issues P:    Monitor   Jennet Maduro, MD PGY 2 Family Medicine 07/07/2016, 11:06 AM  Rush Farmer, M.D. [facility] Pulmonary/Critical Care Medicine. Pager: [phone]. After hours pager: [phone].

## 2016-07-07 NOTE — Progress Notes (Signed)
  Echocardiogram 2D Echocardiogram has been performed.  Kyle [name] 07/07/2016, 1:39 PM

## 2016-07-07 NOTE — Progress Notes (Signed)
PULMONARY / CRITICAL CARE MEDICINE   Name: Kyle [name] MRN: [id number] DOB: Sep 13, 1941    ADMISSION DATE:  07/06/2016 CONSULTATION DATE:  10/23  REFERRING MD:  [facility]  CHIEF COMPLAINT:  Shortness of Breath   HISTORY OF PRESENT ILLNESS:   Kyle [name] is a 49M with PMH significant for HLD, HTN, CAD s/p PCI and CABG, HFpEF, CKD IV undergoing evaluation for fistula placement, and DM who presented to an outside hospital with complaints of shortness of breath for several hours. In transit he was placed on CPAP. Shortly after arrival to the ED he became unresponsive and was intubated.   Labs from the outside hospital showed elevated WBC at 16.5 with left shift. Creat 3.3, BUN 77, K 5.4, CO2 24, Glucose 592, LFTs within normal limits, BNP 17600, troponin 0.08, INR 1.1, PTT 30.6, initial post-intubation ABG 7.280 / 56 / 70 / 28. Lactate 1.2.   Son reports that his complaints began acutely. He was doing pretty well and became acutely short of breath. This was similar to prior heart failure exacerbations. He may have been running a low grade fever. No report of cough / sputum / hemoptysis leading up to his acute presentation. Lower extremity swelling is moderate for him.   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  PAST SURGICAL HISTORY: He  has a past surgical history that includes Tonsillectomy; Adenoidectomy; Cardiac bypass; Coronary artery bypass graft; Cardiac  catheterization; Eye surgery; and Cardiac catheterization (N/A, 03/24/2016).  Allergies  Allergen Reactions  . Statins Other (See Comments)    Pt has tried 4-5 diff kinds. 03/20/2016 Pt reports no reaction or allergy, but has tried different types of statins to figure out which works best for him.     No current facility-administered medications on file prior to encounter.    Current Outpatient Prescriptions on File Prior to Encounter  Medication Sig  . allopurinol (ZYLOPRIM) 100 MG tablet Take 100 mg by mouth daily.  Marland Kitchen amLODipine (NORVASC) 5 MG tablet Take 1 tablet (5 mg total) by mouth daily.  Marland Kitchen apixaban (ELIQUIS) 2.5 MG TABS tablet Take 2.5 mg by mouth 2 (two) times daily.  . carvedilol (COREG) 25 MG tablet Take 25 mg by mouth 2 (two) times daily with a meal.  . cloNIDine (CATAPRES - DOSED IN MG/24 HR) 0.2 mg/24hr patch Place 1 patch (0.2 mg total) onto the skin once a week.  . clopidogrel (PLAVIX) 75 MG tablet Take 1 tablet (75 mg total) by mouth daily.  . cyanocobalamin (,VITAMIN B-12,) 1000 MCG/ML injection Inject 1,000 mcg into the muscle every 30 (thirty) days.  . hydrALAZINE (APRESOLINE) 50 MG tablet Take 50 mg by mouth 3 (three) times daily.  . insulin aspart (NOVOLOG) 100 UNIT/ML injection Inject 15-30 Units into the skin 3 (three) times daily as needed for high blood sugar. Per sliding scale   . insulin NPH Human (NOVOLIN N) 100 UNIT/ML injection Inject 30 Units into the skin 2 (two) times daily.   . isosorbide mononitrate (IMDUR) 60 MG 24 hr tablet Take 60 mg by mouth daily.  Marland Kitchen omeprazole (PRILOSEC) 20  MG capsule Take 20 mg by mouth daily.  Marland Kitchen oxyCODONE (OXY IR/ROXICODONE) 5 MG immediate release tablet Take 5 mg by mouth every 6 (six) hours as needed for moderate pain or severe pain.  . ranolazine (RANEXA) 500 MG 12 hr tablet Take 1 tablet (500 mg total) by mouth 2 (two) times daily.  Marland Kitchen rOPINIRole (REQUIP) 1 MG tablet Take 1 mg by mouth at bedtime as needed (RLS).  Marland Kitchen rosuvastatin  (CRESTOR) 20 MG tablet Take 20 mg by mouth daily.  . tamsulosin (FLOMAX) 0.4 MG CAPS capsule Take 0.8 mg by mouth at bedtime.   . torsemide (DEMADEX) 20 MG tablet Take 2 tablets (40 mg total) by mouth 2 (two) times daily.  Marland Kitchen aspirin EC 81 MG EC tablet Take 1 tablet (81 mg total) by mouth daily.  . Calcium Carb-Cholecalciferol (CALTRATE 600+D3 SOFT PO) Take 1 tablet by mouth daily.  . ferrous sulfate 325 (65 FE) MG tablet Take 325 mg by mouth daily with breakfast.  . fexofenadine (ALLEGRA) 180 MG tablet Take 180 mg by mouth daily.  . Mupirocin (BACTROBAN EX) Apply 1 application topically daily as needed (irritation).  . Probiotic Product (PROBIOTIC MULTI-ENZYME PO) Take 1 tablet by mouth daily.  Marland Kitchen Ubiquinol 50 MG CAPS Take 50 mg by mouth daily.     FAMILY HISTORY:  His indicated that the status of his mother is unknown. He indicated that the status of his father is unknown. He indicated that the status of his sister is unknown. He indicated that the status of his brother is unknown.    SOCIAL HISTORY: He  reports that he quit smoking about 60 years ago. He has never used smokeless tobacco. He reports that he does not drink alcohol or use drugs.  REVIEW OF SYSTEMS:   Denies chest pain, shortness of breath, palpitations   SUBJECTIVE:  Noted to have elevated anion gap while on insulin drip. AG closed this AM. HTN has improved with SBPs between 105 to 140s. Afebrile over the past 24 hours  VITAL SIGNS: BP 136/66   Pulse (!) 45   Temp 97.7 F (36.5 C) (Oral)   Resp 12   Ht 5\' 8"  (1.727 m)   Wt 177 lb 7.5 oz (80.5 kg)   SpO2 97%   BMI 26.98 kg/m   HEMODYNAMICS:    VENTILATOR SETTINGS: Vent Mode: PRVC FiO2 (%):  [50 %] 50 % Set Rate:  [14 bmp] 14 bmp Vt Set:  [550 mL] 550 mL PEEP:  [5 cmH20] 5 cmH20 Plateau Pressure:  [17 cmH20] 17 cmH20  INTAKE / OUTPUT: I/O last 3 completed shifts: In: 341.4 [I.V.:189.4; Other:20; IV Piggyback:132] Out: 2585 [Urine:2585]  PHYSICAL  EXAMINATION: GEN: NAD HEENT: Atraumatic, normocephalic, neck supple, EOMI, sclera clear  CV: irregularly irregular, normal rate, no murmurs, rubs, or gallops PULM: mildly coarse breath sounds but overall clear, normal effort, on Nasal canula 2L  ABD: Soft, nontender, nondistended, NABS, no organomegaly SKIN: No rash or cyanosis; warm and well-perfused EXTR: No lower extremity edema or calf tenderness PSYCH: Mood and affect euthymic, normal rate and volume of speech NEURO: Awake, alert, no focal deficits grossly, normal speech  LABS:  BMET  Recent Labs Lab 07/06/16 1712 07/06/16 2156 07/07/16 0501  NA 138 136 136  K 3.6 3.3* 4.3  CL 99* 97* 96*  CO2 24 23 26   BUN 80* 84* 88*  CREATININE 3.33* 3.22* 3.32*  GLUCOSE 131* 134* 184*    Electrolytes  Recent Labs Lab 07/06/16 0427 07/06/16  1409 07/06/16 1712 07/06/16 2156 07/07/16 0501 07/07/16 0504  CALCIUM 8.9 10.0 10.2 9.6 9.2  --   MG 2.0  --   --   --   --  2.1  PHOS 4.0 2.4*  --   --   --  7.0*    CBC  Recent Labs Lab 07/07/16 0504  WBC 17.3*  HGB 10.4*  HCT 30.5*  PLT 285    Coag's No results for input(s): APTT, INR in the last 168 hours.  Sepsis Markers  Recent Labs Lab 07/06/16 0427 07/06/16 0758  LATICACIDVEN 1.5 2.0*  PROCALCITON 13.90  --     ABG  Recent Labs Lab 07/06/16 0430  PHART 7.354  PCO2ART 46.5  PO2ART 120*    Liver Enzymes  Recent Labs Lab 07/06/16 1409  ALBUMIN 3.3*    Cardiac Enzymes  Recent Labs Lab 07/06/16 0427 07/06/16 1409 07/06/16 1712  TROPONINI 0.25* 0.28* 0.33*    Glucose  Recent Labs Lab 07/07/16 0101 07/07/16 0211 07/07/16 0335 07/07/16 0424 07/07/16 0527 07/07/16 0631  GLUCAP 153* 160* 176* 159* 203* 199*    Imaging No results found.   STUDIES:  CXR 10/23: Right mid to lower lung field hazy density concerning for infiltrate. UA 10/23: no infectious process  CULTURES: MRSA PCR negative Blood cx 10/23-  pending  ANTIBIOTICS: Rocephin 10/22 >> Azithromycin 10/22 >>  SIGNIFICANT EVENTS: 10/23: admit  10/24: extubated  LINES/TUBES: OETT 10/22>>>10/23 OGT 10/22>>>10/23 Foley 10/22>>> PIV  DISCUSSION: Mr. Cicale is a 66M with PMH significant for HFpEF, Atrial Fibrillation, CKD IV planning to start dialysis, IDDM, HTN, HLD, CAD s/p PCI and CABG, and PVD, who presented to an outside ED with acute onset shortness of breath, elevated white count, fever to 101.3. Acute hypoxemic/hypercapneic respiratory failure thought to be due to acute decompensated CHF with possible CAP. He was also markedly hyperglycemic on admission and was started on insulin gtt. Patient seems stable to transfer off ICU to stepdown.   ASSESSMENT / PLAN:  PULMONARY A: Acute hypoxemic/hypercapneic respiratory failure - likely 2/2 acute decompensated CHF +/- CAP, improving and extubated 10/23 P:   OOB to chair. IS per RT protocol PT evaluation  CARDIOVASCULAR A:  Troponin leak  Atrial fibrillation - rate controlled - on Eliquis Hx HTN- BP improving  Hx CAD s/p PCI and CABG Hx PVD P:  PRN hydralazine. TTE pending  Cardiology consulted appreciate reccs: increased Lasix 160mg  q 8 hr; f/u ECHO; no further ischemic workup needed Clonidine patch 0.2mg   Continue Eliquis   RENAL A:   CKD IV Mild hyperkalemia- resolved Hyperphosphatemia  Lactic Acidosis, mild  P:   Nephrology consulted, appreciate reccs: no RRT necessary currently,plan for iHD starting 10/30 Replace electrolytes as indicated Repeat Lactate Calcium Carbonate started today   GASTROINTESTINAL A:   No acute issues P:   Heart healthy carb modified diet Stress ulcer ppx  HEMATOLOGIC A:   Leukocytosis Chronic anemia- stable P:  Trend CBC Eliquis for a/f - no dvt ppx necessary at this time  INFECTIOUS A:   Concern for CAP P:   Continue Rocephin / Azithro Follow Blood cultures  ENDOCRINE A:   IDDM with significant  hyperglycemia P:   CBG and SSI Insulin gtt  NEUROLOGIC A:   No acute issues P:   Monitor   Smiley Houseman, MD PGY 2 Family Medicine 07/07/2016, 6:53 AM  Rush Farmer, M.D. [facility] Pulmonary/Critical Care Medicine. Pager: [phone]. After hours pager: [phone].

## 2016-07-07 NOTE — Progress Notes (Signed)
PULMONARY / CRITICAL CARE MEDICINE   Name: Kyle [name] MRN: [id number] DOB: 12/21/1940    ADMISSION DATE:  07/06/2016 CONSULTATION DATE:  10/23  REFERRING MD:  [facility]  CHIEF COMPLAINT:  Shortness of Breath   HISTORY OF PRESENT ILLNESS:   Kyle [name] is a 59M with PMH significant for HLD, HTN, CAD s/p PCI and CABG, HFpEF, CKD IV undergoing evaluation for fistula placement, and DM who presented to an outside hospital with complaints of shortness of breath for several hours. In transit he was placed on CPAP. Shortly after arrival to the ED he became unresponsive and was intubated.   Labs from the outside hospital showed elevated WBC at 16.5 with left shift. Creat 3.3, BUN 77, K 5.4, CO2 24, Glucose 592, LFTs within normal limits, BNP 17600, troponin 0.08, INR 1.1, PTT 30.6, initial post-intubation ABG 7.280 / 56 / 70 / 28. Lactate 1.2.   Son reports that his complaints began acutely. He was doing pretty well and became acutely short of breath. This was similar to prior heart failure exacerbations. He may have been running a low grade fever. No report of cough / sputum / hemoptysis leading up to his acute presentation. Lower extremity swelling is moderate for him.   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  PAST SURGICAL HISTORY: He  has a past surgical history that includes Tonsillectomy; Adenoidectomy; Cardiac bypass; Coronary artery bypass graft; Cardiac  catheterization; Eye surgery; and Cardiac catheterization (N/A, 03/24/2016).  Allergies  Allergen Reactions  . Statins Other (See Comments)    Pt has tried 4-5 diff kinds. 03/20/2016 Pt reports no reaction or allergy, but has tried different types of statins to figure out which works best for him.     No current facility-administered medications on file prior to encounter.    Current Outpatient Prescriptions on File Prior to Encounter  Medication Sig  . allopurinol (ZYLOPRIM) 100 MG tablet Take 100 mg by mouth daily.  Marland Kitchen amLODipine (NORVASC) 5 MG tablet Take 1 tablet (5 mg total) by mouth daily.  Marland Kitchen apixaban (ELIQUIS) 2.5 MG TABS tablet Take 2.5 mg by mouth 2 (two) times daily.  . carvedilol (COREG) 25 MG tablet Take 25 mg by mouth 2 (two) times daily with a meal.  . cloNIDine (CATAPRES - DOSED IN MG/24 HR) 0.2 mg/24hr patch Place 1 patch (0.2 mg total) onto the skin once a week.  . clopidogrel (PLAVIX) 75 MG tablet Take 1 tablet (75 mg total) by mouth daily.  . cyanocobalamin (,VITAMIN B-12,) 1000 MCG/ML injection Inject 1,000 mcg into the muscle every 30 (thirty) days.  . hydrALAZINE (APRESOLINE) 50 MG tablet Take 50 mg by mouth 3 (three) times daily.  . insulin aspart (NOVOLOG) 100 UNIT/ML injection Inject 15-30 Units into the skin 3 (three) times daily as needed for high blood sugar. Per sliding scale   . insulin NPH Human (NOVOLIN N) 100 UNIT/ML injection Inject 30 Units into the skin 2 (two) times daily.   . isosorbide mononitrate (IMDUR) 60 MG 24 hr tablet Take 60 mg by mouth daily.  Marland Kitchen omeprazole (PRILOSEC) 20  MG capsule Take 20 mg by mouth daily.  Marland Kitchen oxyCODONE (OXY IR/ROXICODONE) 5 MG immediate release tablet Take 5 mg by mouth every 6 (six) hours as needed for moderate pain or severe pain.  . ranolazine (RANEXA) 500 MG 12 hr tablet Take 1 tablet (500 mg total) by mouth 2 (two) times daily.  Marland Kitchen rOPINIRole (REQUIP) 1 MG tablet Take 1 mg by mouth at bedtime as needed (RLS).  Marland Kitchen rosuvastatin  (CRESTOR) 20 MG tablet Take 20 mg by mouth daily.  . tamsulosin (FLOMAX) 0.4 MG CAPS capsule Take 0.8 mg by mouth at bedtime.   . torsemide (DEMADEX) 20 MG tablet Take 2 tablets (40 mg total) by mouth 2 (two) times daily.  Marland Kitchen aspirin EC 81 MG EC tablet Take 1 tablet (81 mg total) by mouth daily.  . Calcium Carb-Cholecalciferol (CALTRATE 600+D3 SOFT PO) Take 1 tablet by mouth daily.  . ferrous sulfate 325 (65 FE) MG tablet Take 325 mg by mouth daily with breakfast.  . fexofenadine (ALLEGRA) 180 MG tablet Take 180 mg by mouth daily.  . Mupirocin (BACTROBAN EX) Apply 1 application topically daily as needed (irritation).  . Probiotic Product (PROBIOTIC MULTI-ENZYME PO) Take 1 tablet by mouth daily.  Marland Kitchen Ubiquinol 50 MG CAPS Take 50 mg by mouth daily.     FAMILY HISTORY:  His indicated that the status of his mother is unknown. He indicated that the status of his father is unknown. He indicated that the status of his sister is unknown. He indicated that the status of his brother is unknown.    SOCIAL HISTORY: He  reports that he quit smoking about 60 years ago. He has never used smokeless tobacco. He reports that he does not drink alcohol or use drugs.  REVIEW OF SYSTEMS:   Denies chest pain, shortness of breath, palpitations   SUBJECTIVE:  Noted to have elevated anion gap while on insulin drip. AG closed this AM. HTN has improved with SBPs between 105 to 140s. Afebrile over the past 24 hours  VITAL SIGNS: BP (!) 159/64   Pulse 97   Temp 99.8 F (37.7 C) (Oral)   Resp 19   Ht 5\' 8"  (1.727 m)   Wt 80.5 kg (177 lb 7.5 oz)   SpO2 94%   BMI 26.98 kg/m   HEMODYNAMICS:    VENTILATOR SETTINGS:    INTAKE / OUTPUT: I/O last 3 completed shifts: In: 1094.1 [I.V.:490.1; Other:40; IV Piggyback:564] Out: [id number] [Urine:4355]  PHYSICAL EXAMINATION: GEN: NAD HEENT: Atraumatic, normocephalic, neck supple, EOMI, sclera clear  CV: irregularly irregular, normal rate, no murmurs, rubs, or  gallops PULM: mildly coarse breath sounds but overall clear, normal effort, on Nasal canula 2L  ABD: Soft, nontender, nondistended, NABS, no organomegaly SKIN: No rash or cyanosis; warm and well-perfused EXTR: No lower extremity edema or calf tenderness PSYCH: Mood and affect euthymic, normal rate and volume of speech NEURO: Awake, alert, no focal deficits grossly, normal speech  LABS:  BMET  Recent Labs Lab 07/06/16 2156 07/07/16 0501 07/07/16 0925  NA 136 136 135  K 3.3* 4.3 4.3  CL 97* 96* 97*  CO2 23 26 23   BUN 84* 88* 92*  CREATININE 3.22* 3.32* 3.25*  GLUCOSE 134* 184* 244*    Electrolytes  Recent Labs Lab 07/06/16 0427 07/06/16 1409  07/06/16 2156 07/07/16 0501 07/07/16 0504 07/07/16 0925  CALCIUM 8.9 10.0  < > 9.6 9.2  --  9.1  MG 2.0  --   --   --   --  2.1  --   PHOS 4.0 2.4*  --   --   --  7.0* 6.6*  < > = values in this interval not displayed.  CBC  Recent Labs Lab 07/07/16 0504  WBC 17.3*  HGB 10.4*  HCT 30.5*  PLT 285    Coag's No results for input(s): APTT, INR in the last 168 hours.  Sepsis Markers  Recent Labs Lab 07/06/16 0427 07/06/16 0758 07/07/16 0925  LATICACIDVEN 1.5 2.0* 1.5  PROCALCITON 13.90  --   --     ABG  Recent Labs Lab 07/06/16 0430  PHART 7.354  PCO2ART 46.5  PO2ART 120*    Liver Enzymes  Recent Labs Lab 07/06/16 1409  ALBUMIN 3.3*    Cardiac Enzymes  Recent Labs Lab 07/06/16 0427 07/06/16 1409 07/06/16 1712  TROPONINI 0.25* 0.28* 0.33*    Glucose  Recent Labs Lab 07/07/16 0424 07/07/16 0527 07/07/16 0631 07/07/16 0807 07/07/16 0859 07/07/16 1004  GLUCAP 159* 203* 199* 178* 204* 252*    Imaging No results found.   STUDIES:  CXR 10/23: Right mid to lower lung field hazy density concerning for infiltrate. UA 10/23: no infectious process  CULTURES: MRSA PCR negative Blood cx 10/23- pending  ANTIBIOTICS: Rocephin 10/22 >> Azithromycin 10/22 >>  SIGNIFICANT  EVENTS: 10/23: admit  10/24: extubated  LINES/TUBES: OETT 10/22>>>10/23 OGT 10/22>>>10/23 Foley 10/22>>> PIV  I reviewed CXR myself, RLL infiltrate noted.  DISCUSSION: Mr. Suda is a 50M with PMH significant for HFpEF, Atrial Fibrillation, CKD IV planning to start dialysis, IDDM, HTN, HLD, CAD s/p PCI and CABG, and PVD, who presented to an outside ED with acute onset shortness of breath, elevated white count, fever to 101.3. Acute hypoxemic/hypercapneic respiratory failure thought to be due to acute decompensated CHF with possible CAP. He was also markedly hyperglycemic on admission and was started on insulin gtt. Patient seems stable to transfer off ICU to stepdown.   ASSESSMENT / PLAN:  PULMONARY A: Acute hypoxemic/hypercapneic respiratory failure - likely 2/2 acute decompensated CHF +/- CAP, improving and extubated 10/23 P:   OOB to chair. IS per RT protocol PT evaluation  CARDIOVASCULAR A:  Troponin leak  Atrial fibrillation - rate controlled - on Eliquis Hx HTN- BP improving  Hx CAD s/p PCI and CABG Hx PVD P:  PRN hydralazine. TTE pending  Cardiology consulted appreciate reccs: increased Lasix 160mg  q 8 hr; f/u ECHO; no further ischemic workup needed Clonidine patch 0.2mg   Continue Eliquis  Transfer to SDU for insulin drip  RENAL A:   CKD IV Mild hyperkalemia- resolved Hyperphosphatemia  Lactic Acidosis, mild  P:   Nephrology consulted, appreciate reccs: no RRT necessary currently,plan for iHD starting 10/30 Replace electrolytes as indicated Calcium Carbonate started today for hyperphosphatemia Plan HD towards the end of the month ?access  GASTROINTESTINAL A:   No acute issues P:   Heart healthy carb modified diet Stress ulcer ppx Levemir 20 units BID ISS   HEMATOLOGIC A:   Leukocytosis Chronic anemia- stable P:  Trend CBC Eliquis for a/f - no dvt ppx necessary at this time  INFECTIOUS A:   Concern for CAP P:   Continue Rocephin /  Azithro, place stop dates at 5 and 8 respectively Follow Blood cultures  ENDOCRINE A:   IDDM with significant hyperglycemia P:   CBG and SSI Insulin gtt Levemir 20 BID ISS  NEUROLOGIC A:   No acute issues P:   Monitor  Transfer to SDU and to [facility] service with PCCM off  10/25.  Discussed with TRH-MD and FPTS-resident.  Rush Farmer, M.D. [facility] Pulmonary/Critical Care Medicine. Pager: [phone]. After hours pager: [phone].

## 2016-07-07 NOTE — Progress Notes (Signed)
Patient Name: Kyle [name] Date of Encounter: 07/07/2016  Primary Cardiologist: Dr Bettina Gavia, [place] Alert. Dr [place] here  Hospital Problem List     Principal Problem:   Acute on chronic respiratory failure with hypoxia and hypercapnia (HCC) Active Problems:   CKD (chronic kidney disease), stage IV (HCC)   Elevated troponin    Subjective   No chest pain now or PTA, just SOB.  Pt has problems w/ CBGs every time he is hospitalized, does not know A1c  Inpatient Medications    Scheduled Meds: . apixaban  2.5 mg Oral BID  . azithromycin  500 mg Intravenous Q24H  . calcium carbonate  1 tablet Oral Q breakfast  . cefTRIAXone (ROCEPHIN)  IV  1 g Intravenous Q24H  . cloNIDine  0.2 mg Transdermal Weekly  . fentaNYL (SUBLIMAZE) injection  50 mcg Intravenous Once  . furosemide  160 mg Intravenous Q6H  . pantoprazole  40 mg Oral Daily   Continuous Infusions: . fentaNYL infusion INTRAVENOUS Stopped (07/06/16 1110)  . insulin (NOVOLIN-R) infusion 4.2 Units/hr (07/07/16 0631)  . propofol (DIPRIVAN) infusion Stopped (07/06/16 1115)   PRN Meds: sodium chloride, fentaNYL, hydrALAZINE   Vital Signs    Vitals:   07/07/16 0800 07/07/16 0808 07/07/16 0900 07/07/16 1000  BP: (!) 147/58  138/72 (!) 159/64  Pulse: 81  89 97  Resp: 12  17 19   Temp:  99.8 F (37.7 C)    TempSrc:  Oral    SpO2: 98%  98% 94%  Weight:      Height:        Intake/Output Summary (Last 24 hours) at 07/07/16 1025 Last data filed at 07/07/16 0600  Gross per 24 hour  Intake           901.17 ml  Output             3425 ml  Net         -2523.83 ml   Filed Weights   07/06/16 0335 07/07/16 0500  Weight: 182 lb 15.7 oz (83 kg) 177 lb 7.5 oz (80.5 kg)    Physical Exam  GEN: Well nourished, well developed, in no acute distress.  HEENT: Grossly normal.  Neck: Supple, JVD 8 cm, +HJR, no carotid bruits, or masses. Cardiac: Irreg R&R, no murmurs, rubs, or gallops. No clubbing, cyanosis, edema.  Radials/DP/PT 2+  and equal bilaterally.  Respiratory:  Respirations regular and unlabored, some dry rales, decreased BS bases bilaterally. GI: Soft, nontender, nondistended, BS + x 4. MS: no deformity or atrophy. Skin: warm and dry, no rash. Neuro:  Strength and sensation are intact. Psych: AAOx3.  Normal affect.  Labs    CBC  Recent Labs  07/07/16 0504  WBC 17.3*  HGB 10.4*  HCT 30.5*  MCV 87.4  PLT [id number]   Basic Metabolic Panel  Recent Labs  07/06/16 0427 07/06/16 1409  07/06/16 2156 07/07/16 0501 07/07/16 0504  NA 132* 139  < > 136 136  --   K 4.8 3.7  < > 3.3* 4.3  --   CL 96* 99*  < > 97* 96*  --   CO2 24 24  < > 23 26  --   GLUCOSE 513* 209*  < > 134* 184*  --   BUN 80* 81*  < > 84* 88*  --   CREATININE 3.59* 3.63*  < > 3.22* 3.32*  --   CALCIUM 8.9 10.0  < > 9.6 9.2  --   MG 2.0  --   --   --   --  2.1  PHOS 4.0 2.4*  --   --   --  7.0*  < > = values in this interval not displayed. Liver Function Tests  Recent Labs  07/06/16 1409  ALBUMIN 3.3*   Cardiac Enzymes  Recent Labs  07/06/16 0427 07/06/16 1409 07/06/16 1712  TROPONINI 0.25* 0.28* 0.33*   Fasting Lipid Panel  Recent Labs  07/06/16 0427  TRIG 82    Telemetry    Atrial fib, rate generally ok  - Personally Reviewed  ECG    10/23, SR, lateral ST depression similar to 03/2016- Personally Reviewed  Radiology    Dg Chest Port 1 View Result Date: 07/06/2016 CLINICAL DATA:  75 year old male with respiratory failure. EXAM: PORTABLE CHEST 1 VIEW COMPARISON:  Chest radiograph dated 07/05/2016 and CT dated 01/09/2016 FINDINGS: Endotracheal tube above the carina in stable positioning. Enteric tube courses into the left upper abdomen with tip beyond the inferior margin of the image. There is cardiomegaly moderate with mild interstitial prominence. An area of hazy density involving the right mid to lower lung field is concerning for infiltrates. Clinical correlation and follow-up recommended. The left lung is  clear. No pleural effusion or pneumothorax. Median sternotomy wires and CABG vascular clips noted. IMPRESSION: Right mid to lower lung field hazy density concerning for infiltrate. Clinical correlation follow-up recommended. Moderate cardiomegaly. Electronically Signed   By: Anner Crete M.D.   On: 07/06/2016 06:19    Cardiac Studies   Echo ordered  Patient Profile     75 year old male with a past medical history of HTN, HLD, paroxsymal atrial fibrillation (on Eliquis) CAD s/p CABG (21 years ago), CKD stage IV, and chronic diastolic CHF (last Echo did not estimate LVEF). Admitted 10/23 from [facility] ED with respiratory failure, intubated at first, now extubated. Cards following for CHF, elevated troponin, afib  Assessment & Plan    1. Acute on chronic diastolic CHF: Presents with acute onset dyspnea, elevated BNP and 7-8 cm JVD. Clinical picture consistent with volume overload and had recently eaten high salt meal. - On 160mg  IV Lasix q6h with good response. - Right heart cath in July of this year had normal C.O, elevated PAWP of 27.   Echo pending.   2. CAP and respiratory failure: Extubated 10/23, management per primary team.   3. Paroxsymal atrial fibrillation: This patients CHA2DS2-VASc =6. Calculated as 1 point each if present [CHF, HTN, DM, Vascular=MI/PAD/Aortic Plaque, Age if 65-74, or Male], 2 points each if present [Age > 75, or Stroke/TIA/TE]  - Has documented history of PAF, on Eliquis outpatient, continue same.  - In/out SR since admit, HR generally controlled.   4. Chronic kidney disease stage IV: renal following, plan for AVF on 10/30.   Signed, Rosaria Ferries, PA-C  07/07/2016, 10:25 AM  Patient seen and examined and history reviewed. Agree with above findings and plan. Feeling much better today. Dyspnea resolved. Excellent diuretic response. Negative 3.8 liters and weight down 5 lbs. Renal indices stable. Lungs are clear. He is in Afib now with controlled  rate. On Eliquis for anticoagulation at lower dose for renal dosing. Will defer diuretic management to Renal. Echo pending.   Seif Teichert [place], [place] 07/07/2016 10:46 AM

## 2016-07-08 ENCOUNTER — Encounter (HOSPITAL_COMMUNITY): Payer: Self-pay | Admitting: General Practice

## 2016-07-08 ENCOUNTER — Other Ambulatory Visit: Payer: Self-pay | Admitting: Vascular Surgery

## 2016-07-08 DIAGNOSIS — E118 Type 2 diabetes mellitus with unspecified complications: Secondary | ICD-10-CM | POA: Diagnosis not present

## 2016-07-08 DIAGNOSIS — E1165 Type 2 diabetes mellitus with hyperglycemia: Secondary | ICD-10-CM

## 2016-07-08 DIAGNOSIS — J189 Pneumonia, unspecified organism: Secondary | ICD-10-CM

## 2016-07-08 DIAGNOSIS — IMO0002 Reserved for concepts with insufficient information to code with codable children: Secondary | ICD-10-CM

## 2016-07-08 DIAGNOSIS — I5033 Acute on chronic diastolic (congestive) heart failure: Secondary | ICD-10-CM | POA: Diagnosis not present

## 2016-07-08 DIAGNOSIS — E1101 Type 2 diabetes mellitus with hyperosmolarity with coma: Secondary | ICD-10-CM

## 2016-07-08 DIAGNOSIS — J9621 Acute and chronic respiratory failure with hypoxia: Secondary | ICD-10-CM | POA: Diagnosis not present

## 2016-07-08 LAB — URINE MICROSCOPIC-ADD ON: Squamous Epithelial / LPF: NONE SEEN

## 2016-07-08 LAB — CBC
HCT: 32.7 % — ABNORMAL LOW (ref 39.0–52.0)
HEMOGLOBIN: 10.8 g/dL — AB (ref 13.0–17.0)
MCH: 29.3 pg (ref 26.0–34.0)
MCHC: 33 g/dL (ref 30.0–36.0)
MCV: 88.6 fL (ref 78.0–100.0)
Platelets: 343 10*3/uL (ref 150–400)
RBC: 3.69 MIL/uL — ABNORMAL LOW (ref 4.22–5.81)
RDW: 15.3 % (ref 11.5–15.5)
WBC: 14.4 10*3/uL — ABNORMAL HIGH (ref 4.0–10.5)

## 2016-07-08 LAB — BASIC METABOLIC PANEL
Anion gap: 13 (ref 5–15)
BUN: 105 mg/dL — ABNORMAL HIGH (ref 6–20)
CALCIUM: 9.1 mg/dL (ref 8.9–10.3)
CHLORIDE: 94 mmol/L — AB (ref 101–111)
CO2: 28 mmol/L (ref 22–32)
CREATININE: 3.25 mg/dL — AB (ref 0.61–1.24)
GFR, EST AFRICAN AMERICAN: 20 mL/min — AB (ref >60)
GFR, EST NON AFRICAN AMERICAN: 17 mL/min — AB (ref >60)
Glucose, Bld: 197 mg/dL — ABNORMAL HIGH (ref 65–99)
Potassium: 3.8 mmol/L (ref 3.5–5.1)
SODIUM: 135 mmol/L (ref 135–145)

## 2016-07-08 LAB — PHOSPHORUS: PHOSPHORUS: 6.8 mg/dL — AB (ref 2.5–4.6)

## 2016-07-08 LAB — GLUCOSE, CAPILLARY
GLUCOSE-CAPILLARY: 176 mg/dL — AB (ref 65–99)
GLUCOSE-CAPILLARY: 154 mg/dL — AB (ref 65–99)
Glucose-Capillary: 88 mg/dL (ref 65–99)

## 2016-07-08 LAB — URINALYSIS, ROUTINE W REFLEX MICROSCOPIC
Bilirubin Urine: NEGATIVE
Glucose, UA: NEGATIVE mg/dL
Ketones, ur: NEGATIVE mg/dL
LEUKOCYTES UA: NEGATIVE
NITRITE: NEGATIVE
PH: 5.5 (ref 5.0–8.0)
Protein, ur: 100 mg/dL — AB
SPECIFIC GRAVITY, URINE: 1.014 (ref 1.005–1.030)

## 2016-07-08 LAB — MAGNESIUM: MAGNESIUM: 2.2 mg/dL (ref 1.7–2.4)

## 2016-07-08 MED ORDER — HYDRALAZINE HCL 20 MG/ML IJ SOLN
10.0000 mg | INTRAMUSCULAR | Status: DC
  Administered 2016-07-08 – 2016-07-09 (×5): 10 mg via INTRAVENOUS
  Filled 2016-07-08 (×5): qty 1
  Filled 2016-07-08: qty 0.5

## 2016-07-08 MED ORDER — FUROSEMIDE 10 MG/ML IJ SOLN
160.0000 mg | Freq: Two times a day (BID) | INTRAVENOUS | Status: DC
  Administered 2016-07-08 – 2016-07-09 (×2): 160 mg via INTRAVENOUS
  Filled 2016-07-08 (×4): qty 16

## 2016-07-08 NOTE — Progress Notes (Signed)
Admit: 07/06/2016 LOS: 2  79M with CKD4, acute resp failure 2/2 CHF and CAP  Subjective:  5.8L UOP. 8L net negative through admission Stable SCr, K 3.8 Good SpO2 on 2L [place] lasx 160 IV QID Feels good, walking in unit, slept well; no c/o  10/24 0701 - 10/25 0700 In: 634 [I.V.:70; IV Piggyback:564] Out: [id number] [Urine:5800]  Filed Weights   07/06/16 0335 07/07/16 0500 07/08/16 0748  Weight: 83 kg (182 lb 15.7 oz) 80.5 kg (177 lb 7.5 oz) 79 kg (174 lb 2.6 oz)    Scheduled Meds: . apixaban  2.5 mg Oral BID  . azithromycin  500 mg Intravenous Q24H  . cefTRIAXone (ROCEPHIN)  IV  1 g Intravenous Q24H  . cloNIDine  0.2 mg Transdermal Weekly  . furosemide  160 mg Intravenous Q6H  . insulin aspart  0-20 Units Subcutaneous TID WC  . insulin aspart  0-5 Units Subcutaneous QHS  . insulin aspart  4 Units Subcutaneous TID WC  . insulin detemir  20 Units Subcutaneous BID  . pantoprazole  40 mg Oral Daily   Continuous Infusions: . insulin (NOVOLIN-R) infusion 4.2 Units/hr (07/07/16 0631)   PRN Meds:.sodium chloride, hydrALAZINE  Current Labs: reviewed    Physical Exam:  Blood pressure (!) 150/83, pulse 90, temperature 98 F (36.7 C), temperature source Oral, resp. rate 13, height 5\' 8"  (1.727 m), weight 79 kg (174 lb 2.6 oz), SpO2 98 %. GEN: NAD, conversant, pleasant ENT: NCAT EYES: EOMI CV: IRIR PULM: diminished throughout,  ABD: s/n/d, obese SKIN: no rashes/lesiosn [id number] to 1+ LEE  A 1. Stable CKD4, BL SCr mid 3s; planning for iHD with AVF scheduled 10/30; Patel MD 2. Resp Failure, now extubated and stable on 2L [place] 3. CAP, fever on CTX+Azithro; B Cx NGTD 4. CAD s/p CABG 5. HTN on 5 agents as outpatient 6. dHF exacerbation 7. DM2 on insluin 8. AFib on NOAC 9. BPH on  10. Hypokalemia 2/2 diuretics 11. Anemia on outpt ESA  Plan 1. Reduce lasi to BID dosing 2. Cont supportive care, resume BP meds as able if BP high 3. Daily weights, Daily Renal Panel, Strict  I/Os, Avoid nephrotoxins (NSAIDs, judicious IV Contrast)  4. WIll cont to follow closely; need to sort out when AV access to be placed  Pearson Grippe MD 07/08/2016, 8:21 AM   Recent Labs Lab 07/07/16 0504 07/07/16 0925 07/07/16 1606 07/07/16 2141 07/08/16 0208  NA  --  135 133* 135 135  K  --  4.3 4.4 4.1 3.8  CL  --  97* 94* 94* 94*  CO2  --  23 22 27 28   GLUCOSE  --  244* 242* 268* 197*  BUN  --  92* 99* 104* 105*  CREATININE  --  3.25* 3.28* 3.46* 3.25*  CALCIUM  --  9.1 9.1 9.3 9.1  PHOS 7.0* 6.6*  --   --  6.8*    Recent Labs Lab 07/07/16 0504 07/08/16 0208  WBC 17.3* 14.4*  HGB 10.4* 10.8*  HCT 30.5* 32.7*  MCV 87.4 88.6  PLT 285 343

## 2016-07-08 NOTE — Progress Notes (Signed)
PROGRESS NOTE    Kyle [name]  [id number] DOB: 1941/04/02 DOA: 07/06/2016 PCP: Ocie Doyne, MD   Brief Narrative:  59M WM PMHx Depression with anxiety,  HLD, HTN, CAD s/p PCI and CABG, Atrial fibrillation, CAD native artery,Chronic systolic CHF, CKD stage IV undergoing evaluation for fistula placement, DM  type II uncontrolled with complications,   Who presented to an outside hospital with complaints of shortness of breath for several hours. In transit he was placed on CPAP. Shortly after arrival to the ED he became unresponsive and was intubated.   Labs from the outside hospital showed elevated WBC at 16.5 with left shift. Creat 3.3, BUN 77, K 5.4, CO2 24, Glucose 592, LFTs within normal limits, BNP 17600, troponin 0.08, INR 1.1, PTT 30.6, initial post-intubation ABG 7.280 / 56 / 70 / 28. Lactate 1.2.   Son reports that his complaints began acutely. He was doing pretty well and became acutely short of breath. This was similar to prior heart failure exacerbations. He may have been running a low grade fever. No report of cough / sputum / hemoptysis leading up to his acute presentation. Lower extremity swelling is moderate for him.    Subjective: 10/25  A/O 4, patient extremely anxious to be discharged. Patient's sister has terminal illness and would like to spend some time with her. Patient already has appointment next week for placement of fistula for HD. (Believe scheduled for 10/30). Negative CP, negative SOB, negative N/V. Sitting in chair comfortably.     Assessment & Plan:   Principal Problem:   Acute on chronic respiratory failure with hypoxia and hypercapnia (HCC) Active Problems:   CKD (chronic kidney disease), stage IV (HCC)   Elevated troponin   Acute on chronic diastolic CHF (congestive heart failure) (HCC)   Hyperglycemia   HHNC (hyperglycemic hyperosmolar nonketotic coma) ([place])   Uncontrolled type 2 diabetes mellitus with complication ([place])   Community  acquired pneumonia    Acute hypoxemic/hypercapneic respiratory failure/CAP  - likely 2/2 acute decompensated CHF +/- CAP, improving and extubated 10/23 -Complete five-day course antibiotics  Troponin leak  -Multifactorial to include decompensated CHF, Atrial fibrillation, uncontrolled HTN.  - rate controlled  - on Eliquis  CAD s/p PCI and CABG  Chronic Diastolic CHF -[id number] Echocardiogram; see results below -Catapres patch 0.2 mg. Unsure why this regimen chosen however cardiology following patient and aware of regimen and has signed off. -Lasix drip per nephrology. Recommend transitioning to PO in the a.m. for discharge as patient already has scheduled for fistula placement next week for HD. Will also need HD catheter placement.  Paroxsymalatrial fibrillation: This patients CHA2DS2-VASc Score and unadjusted Ischemic Stroke Rate (% per year) is equal to 7.2 % stroke rate/year from a score of 5Above score calculated as 1 point each if present [CHF, HTN, DM,Vascular=MI/PAD/Aortic Plaque, Age if 65-74, or Male], 2 points each if present [Age >75, or Stroke/TIA/TE]  CKD IV -Nephrologyto change patient to PO Lasix dosing for discharge  HHNS/ DM  type II uncontrolled with complications,  -7/6 Hemoglobin A1c= 10.4  -Resolved -Levemir 20 units BID -NovoLog 4 units QAC -Diabetic coordinator consulted 10/25      DVT prophylaxis: Eliquis Code Status: Full Family Communication: None Disposition Plan: Discharge 10/26 when Nephrology clears patient   Consultants:  Dr.Peter M [place] [facility]    Procedures/Significant Events:  CXR 10/23: Right mid to lower lung field hazy density concerning for infiltrate. UA 10/23: no infectious process 10/23: admit  10/24:  extubated 10/24 Echocardiogram:- Left ventricle: moderate LVH. LVEF =50% to 55%. Hypokinesis basal-midinferior myocardium.- Left atrium: moderately dilated. - Pulmonary arteries:  Systolic pressure was mildly increased.  Cultures MRSA PCR negative Blood cx 10/23- pending   Antimicrobials: Rocephin 10/22 >> 10/26 Azithromycin 10/22 >> 10/26   Devices    LINES / TUBES:  OETT 10/22>>>10/23 OGT 10/22>>>10/23 Foley 10/22>>> PIV    Continuous Infusions:     Objective: Vitals:   07/08/16 1500 07/08/16 1600 07/08/16 1700 07/08/16 1833  BP: 140/80 (!) 154/85 (!) 143/73 (!) 157/68  Pulse: 73 88 79 100  Resp: 11 17 17 18   Temp:    98 F (36.7 C)  TempSrc:    Oral  SpO2: 97% 97% 96% 100%  Weight:      Height:        Intake/Output Summary (Last 24 hours) at 07/08/16 1845 Last data filed at 07/08/16 1700  Gross per 24 hour  Intake             1152 ml  Output             3800 ml  Net            -2648 ml   Filed Weights   07/06/16 0335 07/07/16 0500 07/08/16 0748  Weight: 83 kg (182 lb 15.7 oz) 80.5 kg (177 lb 7.5 oz) 79 kg (174 lb 2.6 oz)    Examination:  General: A/O 4, NAD, sitting in chair comfortably No acute respiratory distress Eyes: negative scleral hemorrhage, negative anisocoria, negative icterus ENT: Negative Runny nose, negative gingival bleeding, Neck:  Negative scars, masses, torticollis, lymphadenopathy, JVD Lungs: Clear to auscultation bilaterally without wheezes or crackles Cardiovascular: Irregular irregular rhythm and rate, without murmur gallop or rub normal S1 and S2 Abdomen: negative abdominal pain, nondistended, positive soft, bowel sounds, no rebound, no ascites, no appreciable mass Extremities: No significant cyanosis, clubbing, or edema bilateral lower extremities Skin: Negative rashes, lesions, ulcers Psychiatric:  Negative depression, negative anxiety, negative fatigue, negative mania  Central nervous system:  Cranial nerves II through XII intact, tongue/uvula midline, all extremities muscle strength 5/5, sensation intact throughout,negative dysarthria, negative expressive aphasia, negative receptive aphasia.  .       Data Reviewed: Care during the described time interval was provided by me .  I have reviewed this patient's available data, including medical history, events of note, physical examination, and all test results as part of my evaluation. I have personally reviewed and interpreted all radiology studies.  CBC:  Recent Labs Lab 07/07/16 0504 07/08/16 0208  WBC 17.3* 14.4*  HGB 10.4* 10.8*  HCT 30.5* 32.7*  MCV 87.4 88.6  PLT 285 [id number]   Basic Metabolic Panel:  Recent Labs Lab 07/06/16 0427 07/06/16 1409  07/07/16 0501 07/07/16 0504 07/07/16 0925 07/07/16 1606 07/07/16 2141 07/08/16 0208  NA 132* 139  < > 136  --  135 133* 135 135  K 4.8 3.7  < > 4.3  --  4.3 4.4 4.1 3.8  CL 96* 99*  < > 96*  --  97* 94* 94* 94*  CO2 24 24  < > 26  --  23 22 27 28   GLUCOSE 513* 209*  < > 184*  --  244* 242* 268* 197*  BUN 80* 81*  < > 88*  --  92* 99* 104* 105*  CREATININE 3.59* 3.63*  < > 3.32*  --  3.25* 3.28* 3.46* 3.25*  CALCIUM 8.9 10.0  < > 9.2  --  9.1 9.1 9.3 9.1  MG 2.0  --   --   --  2.1  --   --   --  2.2  PHOS 4.0 2.4*  --   --  7.0* 6.6*  --   --  6.8*  < > = values in this interval not displayed. GFR: Estimated Creatinine Clearance: 18.7 mL/min (by C-G formula based on SCr of 3.25 mg/dL (H)). Liver Function Tests:  Recent Labs Lab 07/06/16 1409  ALBUMIN 3.3*   No results for input(s): LIPASE, AMYLASE in the last 168 hours. No results for input(s): AMMONIA in the last 168 hours. Coagulation Profile: No results for input(s): INR, PROTIME in the last 168 hours. Cardiac Enzymes:  Recent Labs Lab 07/06/16 0427 07/06/16 1409 07/06/16 1712  TROPONINI 0.25* 0.28* 0.33*   BNP (last 3 results) No results for input(s): PROBNP in the last 8760 hours. HbA1C: No results for input(s): HGBA1C in the last 72 hours. CBG:  Recent Labs Lab 07/07/16 1222 07/07/16 1536 07/07/16 2210 07/08/16 0743 07/08/16 1207  GLUCAP 122* 231* 239* 88 176*   Lipid Profile:  Recent  Labs  07/06/16 0427  TRIG 82   Thyroid Function Tests: No results for input(s): TSH, T4TOTAL, FREET4, T3FREE, THYROIDAB in the last 72 hours. Anemia Panel: No results for input(s): VITAMINB12, FOLATE, FERRITIN, TIBC, IRON, RETICCTPCT in the last 72 hours. Urine analysis:    Component Value Date/Time   COLORURINE YELLOW 07/06/2016 0530   APPEARANCEUR CLEAR 07/06/2016 0530   LABSPEC 1.015 07/06/2016 0530   PHURINE 5.5 07/06/2016 0530   GLUCOSEU >1000 (A) 07/06/2016 0530   HGBUR NEGATIVE 07/06/2016 0530   BILIRUBINUR NEGATIVE 07/06/2016 0530   KETONESUR NEGATIVE 07/06/2016 0530   PROTEINUR 100 (A) 07/06/2016 0530   NITRITE NEGATIVE 07/06/2016 0530   LEUKOCYTESUR NEGATIVE 07/06/2016 0530   Sepsis Labs: @LABRCNTIP (procalcitonin:4,lacticidven:4)  ) Recent Results (from the past 240 hour(s))  MRSA PCR Screening     Status: None   Collection Time: 07/06/16  3:41 AM  Result Value Ref Range Status   MRSA by PCR NEGATIVE NEGATIVE Final    Comment:        The GeneXpert MRSA Assay (FDA approved for NASAL specimens only), is one component of a comprehensive MRSA colonization surveillance program. It is not intended to diagnose MRSA infection nor to guide or monitor treatment for MRSA infections.   Culture, blood (Routine X 2) w Reflex to ID Panel     Status: None (Preliminary result)   Collection Time: 07/06/16  8:00 AM  Result Value Ref Range Status   Specimen Description BLOOD RIGHT HAND  Final   Special Requests IN PEDIATRIC BOTTLE  3CC  Final   Culture NO GROWTH 2 DAYS  Final   Report Status PENDING  Incomplete  Culture, blood (Routine X 2) w Reflex to ID Panel     Status: None (Preliminary result)   Collection Time: 07/06/16  8:10 AM  Result Value Ref Range Status   Specimen Description BLOOD LEFT ARM  Final   Special Requests IN PEDIATRIC BOTTLE  3CC  Final   Culture NO GROWTH 2 DAYS  Final   Report Status PENDING  Incomplete         Radiology Studies: No  results found.      Scheduled Meds: . apixaban  2.5 mg Oral BID  . azithromycin  500 mg Intravenous Q24H  . cefTRIAXone (ROCEPHIN)  IV  1 g Intravenous Q24H  . cloNIDine  0.2 mg Transdermal Weekly  .  furosemide  160 mg Intravenous BID  . hydrALAZINE  10 mg Intravenous Q4H  . insulin aspart  0-20 Units Subcutaneous TID WC  . insulin aspart  0-5 Units Subcutaneous QHS  . insulin aspart  4 Units Subcutaneous TID WC  . insulin detemir  20 Units Subcutaneous BID  . pantoprazole  40 mg Oral Daily   Continuous Infusions:     LOS: 2 days    Time spent: 40 minutes   WOODS, Geraldo Docker, MD [facility] Pager [phone]   If 7PM-7AM, please contact night-coverage [email or web address] Password [email or web address] 07/08/2016, 6:45 PM

## 2016-07-08 NOTE — Progress Notes (Signed)
Patient Name: Kyle [name] Date of Encounter: 07/08/2016  Primary Cardiologist: Dr. Bettina Gavia in [place], [place]. [place]   Hospital Problem List     Principal Problem:   Acute on chronic respiratory failure with hypoxia and hypercapnia (HCC) Active Problems:   CKD (chronic kidney disease), stage IV (HCC)   Elevated troponin   Acute on chronic diastolic CHF (congestive heart failure) (HCC)   Hyperglycemia     Subjective   Feels well, denies chest pain and SOB.   Inpatient Medications    Scheduled Meds: . apixaban  2.5 mg Oral BID  . azithromycin  500 mg Intravenous Q24H  . cefTRIAXone (ROCEPHIN)  IV  1 g Intravenous Q24H  . cloNIDine  0.2 mg Transdermal Weekly  . furosemide  160 mg Intravenous Q6H  . insulin aspart  0-20 Units Subcutaneous TID WC  . insulin aspart  0-5 Units Subcutaneous QHS  . insulin aspart  4 Units Subcutaneous TID WC  . insulin detemir  20 Units Subcutaneous BID  . pantoprazole  40 mg Oral Daily   Continuous Infusions: . insulin (NOVOLIN-R) infusion 4.2 Units/hr (07/07/16 0631)   PRN Meds: sodium chloride, hydrALAZINE   Vital Signs    Vitals:   07/08/16 0430 07/08/16 0500 07/08/16 0530 07/08/16 0600  BP: 129/66 (!) 149/66 (!) 147/63 (!) 123/59  Pulse: 63 69 75 72  Resp: 12 15 15 13   Temp:      TempSrc:      SpO2: 99% 100% 99% 100%  Weight:      Height:        Intake/Output Summary (Last 24 hours) at 07/08/16 0635 Last data filed at 07/08/16 0204  Gross per 24 hour  Intake              644 ml  Output             4150 ml  Net            -3506 ml   Filed Weights   07/06/16 0335 07/07/16 0500  Weight: 182 lb 15.7 oz (83 kg) 177 lb 7.5 oz (80.5 kg)    Physical Exam   GEN: Well nourished, well developed, in no acute distress.  HEENT: Grossly normal.  Neck: Supple, minimal JVD, no carotid bruits, or masses. Cardiac: irregular rhythm, no murmurs, rubs, or gallops. No clubbing, cyanosis, edema.  Radials/DP/PT 2+ and equal bilaterally.   Respiratory:  Respirations regular and unlabored, clear to auscultation bilaterally. GI: Soft, nontender, nondistended, BS + x 4. MS: no deformity or atrophy. Skin: warm and dry, no rash. Neuro:  Strength and sensation are intact. Psych: AAOx3.  Normal affect.  Labs    CBC  Recent Labs  07/07/16 0504 07/08/16 0208  WBC 17.3* 14.4*  HGB 10.4* 10.8*  HCT 30.5* 32.7*  MCV 87.4 88.6  PLT 285 [id number]   Basic Metabolic Panel  Recent Labs  07/07/16 0504 07/07/16 0925  07/07/16 2141 07/08/16 0208  NA  --  135  < > 135 135  K  --  4.3  < > 4.1 3.8  CL  --  97*  < > 94* 94*  CO2  --  23  < > 27 28  GLUCOSE  --  244*  < > 268* 197*  BUN  --  92*  < > 104* 105*  CREATININE  --  3.25*  < > 3.46* 3.25*  CALCIUM  --  9.1  < > 9.3 9.1  MG 2.1  --   --   --  2.2  PHOS 7.0* 6.6*  --   --  6.8*  < > = values in this interval not displayed. Liver Function Tests  Recent Labs  07/06/16 1409  ALBUMIN 3.3*   Cardiac Enzymes  Recent Labs  07/06/16 0427 07/06/16 1409 07/06/16 1712  TROPONINI 0.25* 0.28* 0.33*   Fasting Lipid Panel  Recent Labs  07/06/16 0427  TRIG 82     Telemetry     atrial fibrillation, rate controlled- Personally Reviewed  ECG    NSR, lateral ST depression - Personally Reviewed  Radiology    No results found.  Cardiac Studies   Transthoracic Echocardiography Study Conclusions  - Left ventricle: The cavity size was normal. Wall thickness was   increased in a pattern of moderate LVH. Systolic function was   normal. The estimated ejection fraction was in the range of 50%   to 55%. There is hypokinesis of the basal-midinferior myocardium.   Doppler parameters are consistent with high ventricular filling   pressure. - Mitral valve: There was mild regurgitation. - Left atrium: The atrium was moderately dilated. - Pulmonary arteries: Systolic pressure was mildly increased.  Impressions:  - Hypokinesis of the basal inferior wall with  overall preserved LV   systolic function; elevated LV filling pressure; moderate LAE;   mild MR and TR; mildly elevated pulmonary pressure  Patient Profile     75 year old male with a past medical history of HTN, HLD, paroxsymalatrial fibrillation (on Eliquis)CAD s/p CABG (21 years ago), CKD stage IV, and chronic diastolic CHF (last Echo did not estimate LVEF). Admitted 10/23 from [facility] ED with respiratory failure, intubated at first, now extubated. Cards following for CHF, elevated troponin, afib  Assessment & Plan    1. Acute on chronic diastolic CHF: Presents with acute onset dyspnea, elevated BNP and 7-8 cm JVD. Clinical picture consistent with volume overload and had recently eaten high salt meal.   Excellent response to diuresis, with -7.9L for admission.   Right heart cath in July of this year had normal C.O. And elevated PAWP of 27.   Echo shows EF of 50-55%, high ventricular filling pattern. There was also hypokinesis of the basal inferior wall. Findings consistent with diastolic CHF.   2. CAP and respiratory failure: Extubated,management per primary team.   3. Paroxsymal atrial fibrillation: This patients CHA2DS2-VASc Score and unadjusted Ischemic Stroke Rate (% per year) is equal to 7.2 % stroke rate/year from a score of 5 Above score calculated as 1 point each if present [CHF, HTN, DM, Vascular=MI/PAD/Aortic Plaque, Age if 65-74, or Male], 2 points each if present [Age > 75, or Stroke/TIA/TE]  Has documented history of PAF, on Eliquis outpatient, continue same.   4. Chronic kidney disease stage IV: renal following, plan for AVF on 10/30.    Signed, Arbutus Leas, NP  07/08/2016, 6:35 AM  Patient seen and examined and history reviewed. Agree with above findings and plan. Feels great today. No dyspnea. Excellent diuresis. Echo personally reviewed. EF looks good at 50-55%. Some inferior HK consistent with known occlusion of SVG to RCA. At this point he is stable  from a cardiac standpoint. Diuretics managed by Renal. We will sign off. Recommend follow up with his regular cardiologist post DC- Dr. Bettina Gavia in [place].  Peter [place], [place] 07/08/2016 8:19 AM

## 2016-07-09 DIAGNOSIS — J9622 Acute and chronic respiratory failure with hypercapnia: Secondary | ICD-10-CM | POA: Diagnosis not present

## 2016-07-09 DIAGNOSIS — J449 Chronic obstructive pulmonary disease, unspecified: Secondary | ICD-10-CM | POA: Diagnosis not present

## 2016-07-09 DIAGNOSIS — J9621 Acute and chronic respiratory failure with hypoxia: Secondary | ICD-10-CM | POA: Diagnosis not present

## 2016-07-09 LAB — RENAL FUNCTION PANEL
ANION GAP: 12 (ref 5–15)
Albumin: 3 g/dL — ABNORMAL LOW (ref 3.5–5.0)
BUN: 112 mg/dL — ABNORMAL HIGH (ref 6–20)
CHLORIDE: 96 mmol/L — AB (ref 101–111)
CO2: 27 mmol/L (ref 22–32)
Calcium: 8.8 mg/dL — ABNORMAL LOW (ref 8.9–10.3)
Creatinine, Ser: 3.37 mg/dL — ABNORMAL HIGH (ref 0.61–1.24)
GFR, EST AFRICAN AMERICAN: 19 mL/min — AB (ref >60)
GFR, EST NON AFRICAN AMERICAN: 16 mL/min — AB (ref >60)
Glucose, Bld: 140 mg/dL — ABNORMAL HIGH (ref 65–99)
POTASSIUM: 3.5 mmol/L (ref 3.5–5.1)
Phosphorus: 5.5 mg/dL — ABNORMAL HIGH (ref 2.5–4.6)
Sodium: 135 mmol/L (ref 135–145)

## 2016-07-09 LAB — GLUCOSE, CAPILLARY
GLUCOSE-CAPILLARY: 167 mg/dL — AB (ref 65–99)
GLUCOSE-CAPILLARY: 87 mg/dL (ref 65–99)
Glucose-Capillary: 58 mg/dL — ABNORMAL LOW (ref 65–99)
Glucose-Capillary: 81 mg/dL (ref 65–99)

## 2016-07-09 MED ORDER — TORSEMIDE 100 MG PO TABS: 100.0000 mg | ORAL_TABLET | Freq: Every day | ORAL | 0 refills | Status: AC

## 2016-07-09 MED ORDER — TORSEMIDE 20 MG PO TABS: 100.0000 mg | ORAL_TABLET | Freq: Every day | ORAL | Status: DC

## 2016-07-09 MED ORDER — INSULIN DETEMIR 100 UNIT/ML ~~LOC~~ SOLN
18.0000 [IU] | Freq: Two times a day (BID) | SUBCUTANEOUS | Status: DC
  Filled 2016-07-09: qty 0.18

## 2016-07-09 NOTE — Progress Notes (Signed)
Patient states he has surgery with Dr.Early on Monday. MD's note on 10/18 states to hold eliquis. Dr. Thereasa Solo paged to clarify.

## 2016-07-09 NOTE — Progress Notes (Signed)
Inpatient Diabetes Program Recommendations  AACE/ADA: New Consensus Statement on Inpatient Glycemic Control (2015)  Target Ranges:  Prepandial:   less than 140 mg/dL      Peak postprandial:   less than 180 mg/dL (1-2 hours)      Critically ill patients:  140 - 180 mg/dL   Lab Results  Component Value Date   GLUCAP 87 07/09/2016   HGBA1C 10.4 (H) 03/19/2016   Spoke with patient, wife and daughter about diabetes and home regimen for diabetes control. Patient reports that he is followed by Dr. Micheal Likens in [place], [place] for diabetes management. Since last admission patient has seen his PCP with no changes. Patient reports checking glucose 2x/day.  Discussed A1C results (10.4% on 03-19-16) and what it is. Discussed glucose and A1C goals. Discussed importance of checking CBGs and maintaining good CBG control to prevent long-term and short-term complications. Explained how hyperglycemia leads to damage within blood vessels which lead to the common complications seen with uncontrolled diabetes. Stressed to the patient the importance of improving glycemic control to prevent further complications from uncontrolled diabetes. Discussed impact of nutrition, exercise, stress, sickness, and medications on diabetes control. Discussed carbohydrates, carbohydrate goals per day and meal, along with portion sizes. Encouraged patient to check his glucose multiple times a day, at times he regularly does not check. Explained how the doctor can use the information for insulin and medication adjustments. Patient to follow up with his PCP for medication adjustment for better glucose control. Patient verbalized understanding of information discussed and he states that he has no further questions at this time related to diabetes.   Thanks,  Tama Headings RN, MSN, [facility] Inpatient Diabetes Coordinator Team Pager [phone] (8a-5p)

## 2016-07-09 NOTE — Discharge Instructions (Signed)

## 2016-07-09 NOTE — Progress Notes (Addendum)
Admit: 07/06/2016 LOS: 3  75M with CKD4, acute resp failure 2/2 CHF and CAP  Subjective:  1.4L UOP, net even yesterday On RA, 100% SpO2 BP stable  10/25 0701 - 10/26 0700 In: 1485.5 [P.O.:720; I.V.:399.5; IV Piggyback:366] Out: 1400 [Urine:1400]  Filed Weights   07/07/16 0500 07/08/16 0748 07/09/16 0500  Weight: 80.5 kg (177 lb 7.5 oz) 79 kg (174 lb 2.6 oz) 79.4 kg (175 lb)    Scheduled Meds: . apixaban  2.5 mg Oral BID  . azithromycin  500 mg Intravenous Q24H  . cefTRIAXone (ROCEPHIN)  IV  1 g Intravenous Q24H  . cloNIDine  0.2 mg Transdermal Weekly  . furosemide  160 mg Intravenous BID  . hydrALAZINE  10 mg Intravenous Q4H  . insulin aspart  0-20 Units Subcutaneous TID WC  . insulin aspart  0-5 Units Subcutaneous QHS  . insulin aspart  4 Units Subcutaneous TID WC  . insulin detemir  20 Units Subcutaneous BID  . pantoprazole  40 mg Oral Daily   Continuous Infusions:   PRN Meds:.sodium chloride  Current Labs: reviewed    Physical Exam:  Blood pressure (!) 147/68, pulse 70, temperature 98.2 F (36.8 C), temperature source Oral, resp. rate 20, height 5\' 8"  (1.727 m), weight 79.4 kg (175 lb), SpO2 100 %. GEN: NAD, conversant, pleasant ENT: NCAT EYES: EOMI CV: IRIR PULM: diminished throughout,  ABD: s/n/d, obese SKIN: no rashes/lesiosn [id number] to 1+ LEE  A 1. Stable CKD4, BL SCr mid 3s; planning for iHD with AVF scheduled 10/30; Patel MD 2. Resp Failure, now extubated and stable on 2L [place] 3. CAP, fever on CTX+Azithro; B Cx NGTD 4. CAD s/p CABG 5. HTN on 5 agents as outpatient 6. dHF exacerbation 7. DM2 on insluin 8. AFib on NOAC 9. BPH on  10. Hypokalemia 2/2 diuretics 11. Anemia on outpt ESA  Plan 1. Change to PO Torsemide 100mg  QAM, rec IV lasix this AM, can start torsemide tomorrow 2. Await renal panel 3. If SCr stable can plan for discharge today from renal point of view 4. Daily weights, Daily Renal Panel, Strict I/Os, Avoid nephrotoxins (NSAIDs,  judicious IV Contrast)  5. Outpt AVF placement 10/30 still best option  UPDATE: AM RP stable. OK For DC. Will set up f/u appt with Dr. Stefani Dama MD 07/09/2016, 9:50 AM   Recent Labs Lab 07/07/16 [id number] 07/07/16 0925 07/07/16 1606 07/07/16 2141 07/08/16 0208  NA  --  135 133* 135 135  K  --  4.3 4.4 4.1 3.8  CL  --  97* 94* 94* 94*  CO2  --  23 22 27 28   GLUCOSE  --  244* 242* 268* 197*  BUN  --  92* 99* 104* 105*  CREATININE  --  3.25* 3.28* 3.46* 3.25*  CALCIUM  --  9.1 9.1 9.3 9.1  PHOS 7.0* 6.6*  --   --  6.8*    Recent Labs Lab 07/07/16 0504 07/08/16 0208  WBC 17.3* 14.4*  HGB 10.4* 10.8*  HCT 30.5* 32.7*  MCV 87.4 88.6  PLT 285 343

## 2016-07-09 NOTE — Care Management Note (Signed)
Case Management Note  Patient Details  Name: Kyle [name] MRN: [id number] Date of Birth: November 02, 1940  Subjective/Objective:                 Patient from home with spouse has DME walker WC BSC shower seat. PCP Dr Micheal Likens. No CM needs identified at this time.    Action/Plan:  DC to home self care. Expected Discharge Date:  07/13/16               Expected Discharge Plan:  Home/Self Care  In-House Referral:  NA  Discharge planning Services  CM Consult  Post Acute Care Choice:  NA Choice offered to:  NA  DME Arranged:  N/A DME Agency:  NA  HH Arranged:  NA HH Agency:  NA  Status of Service:  Completed, signed off  If discussed at [place] of Stay Meetings, dates discussed:    Additional Comments:  Carles Collet, RN 07/09/2016, 2:26 PM

## 2016-07-09 NOTE — Progress Notes (Addendum)
Patient was discharged home by MD order; discharged instructions  review and give to patient and his daughter with care notes and prescription; IV DIC; Foley D/C (patient voided approximately 150cc after foley was removed); patient will be escorted to the car by nurse tech via wheelchair.

## 2016-07-09 NOTE — Progress Notes (Signed)
Per Dr. Curt Jews, Eliquis was D/C due to intervention that is scheduled to be done on Monday 07/13/2016. MD notified. Will continue to monitor.

## 2016-07-09 NOTE — Progress Notes (Signed)
Hypoglycemic Event  CBG: 58 at 0407  Treatment: 15 GM carbohydrate snack  Symptoms: Nervous/irritable  Follow-up CBG: Time: 0423 CBG Result:81  Possible Reasons for Event: Unknown  Comments/MD notified:Kirby,NP notified    Kyle [name] A Xariah Silvernail

## 2016-07-09 NOTE — Progress Notes (Signed)
Results for NAYTHAN, MORATH (MRN [id number]) as of 07/09/2016 11:08  Ref. Range 07/08/2016 12:07 07/08/2016 22:10 07/09/2016 04:07 07/09/2016 04:23 07/09/2016 08:10  Glucose-Capillary Latest Ref Range: 65 - 99 mg/dL 176 (H) 154 (H) 58 (L) 81 167 (H)   Noted that blood sugars have been less than 70 mg/dl. Recommend decreasing Levemir to 16-18 units BID and decreasing Novolog correction scale to MODERATE TID & HS.  Continue Novolog 4 units TID meal coverage if eating at least 50 % of meals.  Harvel Ricks RN BSN CDE

## 2016-07-09 NOTE — Discharge Summary (Signed)
DISCHARGE SUMMARY  Kyle [name]  MR#: [id number]  DOB:1941/05/27  Date of Admission: 07/06/2016 Date of Discharge: 07/09/2016  Attending Physician:[name],Kyle T  Patient's MT:[id number], Kyle Mole, MD  Consults: Nephrology  Vascular Surgery  Cardiology   Disposition: D/C home   Follow-up Appts: Follow-up Information    Kyle [name], Kyle Mole, MD. Schedule an appointment as soon as possible for a visit in 1 week(s).   Specialty:  Family Medicine Contact information: [place] [address] [phone]        Kyle [name]., MD .   Specialty:  Nephrology Why:  The office will contact you to make a follow up appointment.   Contact information: [place] [address] [phone]          Discharge Diagnoses: Acute hypoxemic/hypercapneic respiratory failure  CAP  Acute exacerbation of Chronic Diastolic CHF Troponin leak - no ACS CAD s/p PCI and CABG Paroxsymalatrial fibrillation CKD IV DM type II uncontrolled   Initial presentation: 80M M Hx Depression with anxiety, HLD, HTN, CAD s/p PCI and CABG, Atrial fibrillation, Chronic systolic CHF, CKD stage IV undergoing evaluation for fistula placement, and DMII who presented to an outside hospital with complaints of acute onset of severe shortness of breath. Shortly after arrival to the ED he became unresponsive and was intubated.  Labs from the outside hospital showed Creat 3.3, BUN 77, K 5.4, CO2 24, Glucose 592.   Hospital Course:  Acute hypoxemic and hypercapneic respiratory failure  - likely 2/2 acute decompensated CHF in setting of CKD +/- CAP - resolved - extubated 10/23 - completed 3 day course antibiotics in hospital   Acute exacerbation of Chronic Diastolic CHF -diuresis directed by Nephrology - well compensated at time of d/c - has been counseled extensively on low salt renal diet   CKD IV -followed by Nephrology during admit - for vasc access surgery 10/30 in anticipation of HD soon -  diuretic dosing as outpt per Neph  Troponin leak  -Multifactorial to include decompensated CHF, Atrial fibrillation, uncontrolled HTN - Cards evaluated   CAD s/p PCI and CABG -no active symptoms - Cards evaluated during hospital stay  Paroxsymalatrial fibrillation CHA2DS2-VASc Score is 5 - managed by Cardiology - eliquis stopped at time of d/c to allow for vascular surgery 10/30 - to resume postop - rate controlled at time of d/c   DM type II uncontrolled with renal complications  -7/6 [id number] - CBG reasonably controlled at time of d/c     Medication List    STOP taking these medications   amLODipine 5 MG tablet Commonly known as:  NORVASC   aspirin 81 MG EC tablet   clopidogrel 75 MG tablet Commonly known as:  PLAVIX   ELIQUIS 2.5 MG Tabs tablet Generic drug:  apixaban   isosorbide mononitrate 60 MG 24 hr tablet Commonly known as:  IMDUR   Ubiquinol 50 MG Caps     TAKE these medications   allopurinol 100 MG tablet Commonly known as:  ZYLOPRIM Take 100 mg by mouth daily.   BACTROBAN EX Apply 1 application topically daily as needed (irritation).   CALTRATE 600+D3 SOFT PO Take 1 tablet by mouth daily.   carvedilol 25 MG tablet Commonly known as:  COREG Take 25 mg by mouth 2 (two) times daily with a meal.   cloNIDine 0.2 mg/24hr patch Commonly known as:  CATAPRES - Dosed in mg/24 hr Place 1 patch (0.2 mg total) onto the skin once a week.  cyanocobalamin 1000 MCG/ML injection Commonly known as:  (VITAMIN B-12) Inject 1,000 mcg into the muscle every 30 (thirty) days.   ferrous sulfate 325 (65 FE) MG tablet Take 325 mg by mouth daily with breakfast.   fexofenadine 180 MG tablet Commonly known as:  ALLEGRA Take 180 mg by mouth daily.   hydrALAZINE 50 MG tablet Commonly known as:  APRESOLINE Take 50 mg by mouth 3 (three) times daily.   insulin aspart 100 UNIT/ML injection Commonly known as:  novoLOG Inject 15-30 Units into the skin 3 (three) times  daily as needed for high blood sugar. Per sliding scale   nitroGLYCERIN 0.4 MG SL tablet Commonly known as:  NITROSTAT Place 0.4 mg under the tongue every 5 (five) minutes as needed for chest pain.   NOVOLIN N 100 UNIT/ML injection Generic drug:  insulin NPH Human Inject 30 Units into the skin 2 (two) times daily.   omeprazole 20 MG capsule Commonly known as:  PRILOSEC Take 20 mg by mouth daily.   ondansetron 4 MG disintegrating tablet Commonly known as:  ZOFRAN-ODT Take 4 mg by mouth every 4 (four) hours as needed for nausea or vomiting.   oxyCODONE 5 MG immediate release tablet Commonly known as:  Oxy IR/ROXICODONE Take 5 mg by mouth every 6 (six) hours as needed for moderate pain or severe pain.   PROBIOTIC MULTI-ENZYME PO Take 1 tablet by mouth daily.   ranolazine 500 MG 12 hr tablet Commonly known as:  RANEXA Take 1 tablet (500 mg total) by mouth 2 (two) times daily.   rOPINIRole 1 MG tablet Commonly known as:  REQUIP Take 1 mg by mouth at bedtime as needed (RLS).   rosuvastatin 20 MG tablet Commonly known as:  CRESTOR Take 20 mg by mouth daily.   tamsulosin 0.4 MG Caps capsule Commonly known as:  FLOMAX Take 0.8 mg by mouth at bedtime.   torsemide 100 MG tablet Commonly known as:  DEMADEX Take 1 tablet (100 mg total) by mouth daily. Start taking on:  07/10/2016 What changed:  medication strength  how much to take  when to take this       Day of Discharge BP (!) 147/68 (BP Location: Left Arm)   Pulse 70   Temp 98.2 F (36.8 C) (Oral)   Resp 20   Ht 5\' 8"  (1.727 m)   Wt 79.4 kg (175 lb)   SpO2 100%   BMI 26.61 kg/m   Physical Exam: General: No acute respiratory distress Lungs: Clear to auscultation bilaterally without wheezes or crackles Cardiovascular: Regular rate without murmur gallop or rub normal S1 and S2 Abdomen: Nontender, nondistended, soft, bowel sounds positive, no rebound, no ascites, no appreciable mass Extremities: No  significant cyanosis, clubbing, or edema bilateral lower extremities  Basic Metabolic Panel:  Recent Labs Lab 07/06/16 0427 07/06/16 1409  07/07/16 0504 07/07/16 0925 07/07/16 1606 07/07/16 2141 07/08/16 0208 07/09/16 0904  NA 132* 139  < >  --  135 133* 135 135 135  K 4.8 3.7  < >  --  4.3 4.4 4.1 3.8 3.5  CL 96* 99*  < >  --  97* 94* 94* 94* 96*  CO2 24 24  < >  --  23 22 27 28 27   GLUCOSE 513* 209*  < >  --  244* 242* 268* 197* 140*  BUN 80* 81*  < >  --  92* 99* 104* 105* 112*  CREATININE 3.59* 3.63*  < >  --  3.25*  3.28* 3.46* 3.25* 3.37*  CALCIUM 8.9 10.0  < >  --  9.1 9.1 9.3 9.1 8.8*  MG 2.0  --   --  2.1  --   --   --  2.2  --   PHOS 4.0 2.4*  --  7.0* 6.6*  --   --  6.8* 5.5*  < > = values in this interval not displayed.  Liver Function Tests:  Recent Labs Lab 07/06/16 1409 07/09/16 0904  ALBUMIN 3.3* 3.0*   CBC:  Recent Labs Lab 07/07/16 0504 07/08/16 0208  WBC 17.3* 14.4*  HGB 10.4* 10.8*  HCT 30.5* 32.7*  MCV 87.4 88.6  PLT 285 343    Cardiac Enzymes:  Recent Labs Lab 07/06/16 0427 07/06/16 1409 07/06/16 1712  TROPONINI 0.25* 0.28* 0.33*    CBG:  Recent Labs Lab 07/08/16 2210 07/09/16 0407 07/09/16 0423 07/09/16 0810 07/09/16 1221  GLUCAP 154* 58* 81 167* 87    Recent Results (from the past 240 hour(s))  MRSA PCR Screening     Status: None   Collection Time: 07/06/16  3:41 AM  Result Value Ref Range Status   MRSA by PCR NEGATIVE NEGATIVE Final    Comment:        The GeneXpert MRSA Assay (FDA approved for NASAL specimens only), is one component of a comprehensive MRSA colonization surveillance program. It is not intended to diagnose MRSA infection nor to guide or monitor treatment for MRSA infections.   Culture, blood (Routine X 2) w Reflex to ID Panel     Status: None (Preliminary result)   Collection Time: 07/06/16  8:00 AM  Result Value Ref Range Status   Specimen Description BLOOD RIGHT HAND  Final   Special  Requests IN PEDIATRIC BOTTLE  3CC  Final   Culture NO GROWTH 2 DAYS  Final   Report Status PENDING  Incomplete  Culture, blood (Routine X 2) w Reflex to ID Panel     Status: None (Preliminary result)   Collection Time: 07/06/16  8:10 AM  Result Value Ref Range Status   Specimen Description BLOOD LEFT ARM  Final   Special Requests IN PEDIATRIC BOTTLE  3CC  Final   Culture NO GROWTH 2 DAYS  Final   Report Status PENDING  Incomplete      Time spent in discharge (includes decision making & examination of pt): >35 minutes  07/09/2016, 1:43 PM   Cherene Altes, MD [facility] Office  [phone] Pager [phone]  On-Call/Text Page:      [email or web address]      password [facility]

## 2016-07-09 NOTE — Consult Note (Signed)
   [facility] Inpatient Consult   07/09/2016  Kyle [name] 08-04-1941 [id number]   Patient was evaluated for [facility] [place] Management services.    Met with the patient, wife , and daughter with patient's permission regarding the benefits of [facility] Care Management services.  Explained that [facility] Management is a covered benefit of his [employer] insurance plan.  Patient endorses Dr. Micheal Likens as his primary care provider and lives in [place], [place].  Review information for [facility] Care Management and a folder was provided with contact information.  Explained that [facility] Management does not interfere with or replace any services arranged by the inpatient care management staff.  Patient declined services with [facility] stating, "I have two or three nurses in my family and they keep a check on me.  I've been a diabetic for 40 years and I think I am doing alright."   For questions, please contact:  Natividad Brood, RN BSN [facility] Liaison  [phone] business mobile phone Toll free office [phone]

## 2016-07-10 ENCOUNTER — Encounter (HOSPITAL_COMMUNITY): Payer: Self-pay | Admitting: *Deleted

## 2016-07-10 ENCOUNTER — Other Ambulatory Visit: Payer: Self-pay

## 2016-07-10 NOTE — Progress Notes (Signed)
Called pt for pre-op call, he answered the phone but wanted daughter to talk to me. I spoke with [name]. Pt just discharged from hospital 07/08/16. Pt was instructed to stop Amlodipine, Aspirin, Plavix and Isosorbide for upcoming surgery. Stephanie at Dr. Luther Parody office called and stated that Dr. Donnetta Hutching wants to have pt on Aspirin and Plavix prior to surgery. I paged Dr. Thereasa Solo who was the physician who discharged the patient to ask if the Amlodipine and the Isosorbide was stopped for good or just for surgery. He has not returned the page. When I spoke with [name], I told her that the Aspirin and Plavix is to be restarted. I told her if Dr. Thereasa Solo does call me back, I will call her and let her know what he said. I told her if she has not heard from me by 7:30 this pm, she would need to just keep him off those meds and follow up with his PCP. She voiced understanding. Pt is diabetic, last A1C in July was 10.4. Fasting blood sugar in the hospital ranged from 58 to over 200. Instructed her to have pt take 1/2 of his regular dose of Novolin N (he's to take 15 units) Sunday PM and Monday AM. Instructed her to have him check his blood sugar Monday AM every 2 hours until he leaves for the hospital. If blood sugar is 220 or > take 1/2 of usual correction dose of Novolog insulin. If blood sugar is 70 or below, treat with 1/2 cup of clear juice (apple or cranberry) and recheck blood sugar 15 minutes after drinking juice. If blood sugar continues to be 70 or below, call the Short Stay department and ask to speak to a nurse.She voiced understanding.

## 2016-07-11 LAB — CULTURE, BLOOD (ROUTINE X 2)
Culture: NO GROWTH
Culture: NO GROWTH

## 2016-07-13 ENCOUNTER — Ambulatory Visit (HOSPITAL_COMMUNITY): Payer: PPO | Admitting: Anesthesiology

## 2016-07-13 ENCOUNTER — Encounter (HOSPITAL_COMMUNITY)
Admission: EM | Disposition: A | Discharge status: Home / Self Care | Payer: Self-pay | Source: Other Acute Inpatient Hospital | Attending: Vascular Surgery

## 2016-07-13 ENCOUNTER — Ambulatory Visit (HOSPITAL_COMMUNITY)
Admission: EM | Admit: 2016-07-13 | Discharge: 2016-07-13 | Disposition: A | Discharge status: Home / Self Care | Payer: PPO | Source: Other Acute Inpatient Hospital | Attending: Vascular Surgery | Admitting: Vascular Surgery

## 2016-07-13 ENCOUNTER — Encounter (HOSPITAL_COMMUNITY): Payer: Self-pay | Admitting: *Deleted

## 2016-07-13 DIAGNOSIS — E1122 Type 2 diabetes mellitus with diabetic chronic kidney disease: Secondary | ICD-10-CM | POA: Insufficient documentation

## 2016-07-13 DIAGNOSIS — I509 Heart failure, unspecified: Secondary | ICD-10-CM | POA: Insufficient documentation

## 2016-07-13 DIAGNOSIS — Z794 Long term (current) use of insulin: Secondary | ICD-10-CM | POA: Insufficient documentation

## 2016-07-13 DIAGNOSIS — Z801 Family history of malignant neoplasm of trachea, bronchus and lung: Secondary | ICD-10-CM | POA: Diagnosis not present

## 2016-07-13 DIAGNOSIS — K219 Gastro-esophageal reflux disease without esophagitis: Secondary | ICD-10-CM | POA: Diagnosis not present

## 2016-07-13 DIAGNOSIS — F418 Other specified anxiety disorders: Secondary | ICD-10-CM | POA: Insufficient documentation

## 2016-07-13 DIAGNOSIS — Z888 Allergy status to other drugs, medicaments and biological substances status: Secondary | ICD-10-CM | POA: Insufficient documentation

## 2016-07-13 DIAGNOSIS — Z7902 Long term (current) use of antithrombotics/antiplatelets: Secondary | ICD-10-CM | POA: Insufficient documentation

## 2016-07-13 DIAGNOSIS — Z7901 Long term (current) use of anticoagulants: Secondary | ICD-10-CM | POA: Diagnosis not present

## 2016-07-13 DIAGNOSIS — I130 Hypertensive heart and chronic kidney disease with heart failure and stage 1 through stage 4 chronic kidney disease, or unspecified chronic kidney disease: Secondary | ICD-10-CM | POA: Diagnosis not present

## 2016-07-13 DIAGNOSIS — N184 Chronic kidney disease, stage 4 (severe): Secondary | ICD-10-CM | POA: Insufficient documentation

## 2016-07-13 DIAGNOSIS — I252 Old myocardial infarction: Secondary | ICD-10-CM | POA: Insufficient documentation

## 2016-07-13 DIAGNOSIS — I4891 Unspecified atrial fibrillation: Secondary | ICD-10-CM | POA: Insufficient documentation

## 2016-07-13 DIAGNOSIS — Z833 Family history of diabetes mellitus: Secondary | ICD-10-CM | POA: Insufficient documentation

## 2016-07-13 DIAGNOSIS — E785 Hyperlipidemia, unspecified: Secondary | ICD-10-CM | POA: Diagnosis not present

## 2016-07-13 DIAGNOSIS — I708 Atherosclerosis of other arteries: Secondary | ICD-10-CM | POA: Diagnosis not present

## 2016-07-13 DIAGNOSIS — D631 Anemia in chronic kidney disease: Secondary | ICD-10-CM | POA: Diagnosis not present

## 2016-07-13 DIAGNOSIS — N400 Enlarged prostate without lower urinary tract symptoms: Secondary | ICD-10-CM | POA: Diagnosis not present

## 2016-07-13 DIAGNOSIS — I10 Essential (primary) hypertension: Secondary | ICD-10-CM | POA: Diagnosis not present

## 2016-07-13 DIAGNOSIS — I2510 Atherosclerotic heart disease of native coronary artery without angina pectoris: Secondary | ICD-10-CM | POA: Insufficient documentation

## 2016-07-13 DIAGNOSIS — Z8249 Family history of ischemic heart disease and other diseases of the circulatory system: Secondary | ICD-10-CM | POA: Diagnosis not present

## 2016-07-13 DIAGNOSIS — Z951 Presence of aortocoronary bypass graft: Secondary | ICD-10-CM | POA: Insufficient documentation

## 2016-07-13 DIAGNOSIS — Z7982 Long term (current) use of aspirin: Secondary | ICD-10-CM | POA: Diagnosis not present

## 2016-07-13 DIAGNOSIS — Z87891 Personal history of nicotine dependence: Secondary | ICD-10-CM | POA: Insufficient documentation

## 2016-07-13 HISTORY — DX: Anemia, unspecified: D64.9

## 2016-07-13 HISTORY — PX: AV FISTULA PLACEMENT: SHX1204

## 2016-07-13 HISTORY — DX: Restless legs syndrome: G25.81

## 2016-07-13 LAB — GLUCOSE, CAPILLARY
GLUCOSE-CAPILLARY: 95 mg/dL (ref 65–99)
GLUCOSE-CAPILLARY: 45 mg/dL — AB (ref 65–99)
GLUCOSE-CAPILLARY: 94 mg/dL (ref 65–99)
Glucose-Capillary: 50 mg/dL — ABNORMAL LOW (ref 65–99)
Glucose-Capillary: 154 mg/dL — ABNORMAL HIGH (ref 65–99)

## 2016-07-13 LAB — POCT I-STAT 4, (NA,K, GLUC, HGB,HCT)
Glucose, Bld: 55 mg/dL — ABNORMAL LOW (ref 65–99)
HEMATOCRIT: 30 % — AB (ref 39.0–52.0)
Hemoglobin: 10.2 g/dL — ABNORMAL LOW (ref 13.0–17.0)
Potassium: 4.2 mmol/L (ref 3.5–5.1)
Sodium: 134 mmol/L — ABNORMAL LOW (ref 135–145)

## 2016-07-13 LAB — PROTIME-INR
INR: 1
Prothrombin Time: 13.2 seconds (ref 11.4–15.2)

## 2016-07-13 SURGERY — ARTERIOVENOUS (AV) FISTULA CREATION
Anesthesia: Monitor Anesthesia Care | Site: Arm Lower | Laterality: Right

## 2016-07-13 MED ORDER — DEXTROSE 50 % IV SOLN
INTRAVENOUS | Status: DC | PRN
  Administered 2016-07-13: 50 mL via INTRAVENOUS

## 2016-07-13 MED ORDER — APIXABAN 2.5 MG PO TABS: 2.5000 mg | ORAL_TABLET | Freq: Two times a day (BID) | ORAL | 0 refills | Status: AC

## 2016-07-13 MED ORDER — DEXTROSE 5 % IV SOLN
INTRAVENOUS | Status: AC
  Filled 2016-07-13: qty 1.5

## 2016-07-13 MED ORDER — OXYCODONE HCL 5 MG PO TABS: 5.0000 mg | ORAL_TABLET | Freq: Four times a day (QID) | ORAL | 0 refills | Status: DC | PRN

## 2016-07-13 MED ORDER — LIDOCAINE-EPINEPHRINE 1 %-1:100000 IJ SOLN
INTRAMUSCULAR | Status: AC
  Filled 2016-07-13: qty 1

## 2016-07-13 MED ORDER — CHLORHEXIDINE GLUCONATE CLOTH 2 % EX PADS: 6.0000 | MEDICATED_PAD | Freq: Once | CUTANEOUS | Status: DC

## 2016-07-13 MED ORDER — FENTANYL CITRATE (PF) 100 MCG/2ML IJ SOLN
INTRAMUSCULAR | Status: DC | PRN
  Administered 2016-07-13: 100 ug via INTRAVENOUS

## 2016-07-13 MED ORDER — FENTANYL CITRATE (PF) 100 MCG/2ML IJ SOLN
INTRAMUSCULAR | Status: AC
  Filled 2016-07-13: qty 2

## 2016-07-13 MED ORDER — SODIUM CHLORIDE 0.9 % IV SOLN
INTRAVENOUS | Status: DC
  Administered 2016-07-13: 08:00:00 via INTRAVENOUS

## 2016-07-13 MED ORDER — LIDOCAINE HCL (CARDIAC) 20 MG/ML IV SOLN
INTRAVENOUS | Status: DC | PRN
  Administered 2016-07-13: 50 mg via INTRATRACHEAL

## 2016-07-13 MED ORDER — 0.9 % SODIUM CHLORIDE (POUR BTL) OPTIME
TOPICAL | Status: DC | PRN
  Administered 2016-07-13: 1000 mL

## 2016-07-13 MED ORDER — LIDOCAINE 2% (20 MG/ML) 5 ML SYRINGE
INTRAMUSCULAR | Status: AC
  Filled 2016-07-13: qty 10

## 2016-07-13 MED ORDER — DEXTROSE 50 % IV SOLN
INTRAVENOUS | Status: AC
  Filled 2016-07-13: qty 50

## 2016-07-13 MED ORDER — DEXTROSE 5 % IV SOLN
1.5000 g | INTRAVENOUS | Status: AC
  Administered 2016-07-13: 1.5 g via INTRAVENOUS

## 2016-07-13 MED ORDER — HYDROMORPHONE HCL 1 MG/ML IJ SOLN: 0.2500 mg | INTRAMUSCULAR | Status: DC | PRN

## 2016-07-13 MED ORDER — PROPOFOL 500 MG/50ML IV EMUL
INTRAVENOUS | Status: DC | PRN
  Administered 2016-07-13: 25 ug/kg/min via INTRAVENOUS

## 2016-07-13 MED ORDER — EPHEDRINE 5 MG/ML INJ
INTRAVENOUS | Status: AC
  Filled 2016-07-13: qty 10

## 2016-07-13 MED ORDER — SODIUM CHLORIDE 0.9 % IV SOLN
INTRAVENOUS | Status: DC | PRN
  Administered 2016-07-13: 500 mL

## 2016-07-13 MED ORDER — DIPHENHYDRAMINE HCL 50 MG/ML IJ SOLN
INTRAMUSCULAR | Status: AC
  Filled 2016-07-13: qty 1

## 2016-07-13 MED ORDER — DEXTROSE 50 % IV SOLN
1.0000 | Freq: Once | INTRAVENOUS | Status: AC
  Administered 2016-07-13: 50 mL via INTRAVENOUS
  Filled 2016-07-13: qty 50

## 2016-07-13 MED ORDER — DIPHENHYDRAMINE HCL 50 MG/ML IJ SOLN
INTRAMUSCULAR | Status: DC | PRN
  Administered 2016-07-13: 25 mg via INTRAVENOUS

## 2016-07-13 MED ORDER — ONDANSETRON HCL 4 MG/2ML IJ SOLN
INTRAMUSCULAR | Status: AC
  Filled 2016-07-13: qty 2

## 2016-07-13 MED ORDER — LIDOCAINE-EPINEPHRINE 0.5 %-1:200000 IJ SOLN
INTRAMUSCULAR | Status: DC | PRN
  Administered 2016-07-13: 30 mL

## 2016-07-13 MED ORDER — PHENYLEPHRINE 40 MCG/ML (10ML) SYRINGE FOR IV PUSH (FOR BLOOD PRESSURE SUPPORT)
PREFILLED_SYRINGE | INTRAVENOUS | Status: AC
  Filled 2016-07-13: qty 10

## 2016-07-13 SURGICAL SUPPLY — 38 items
ARMBAND PINK RESTRICT EXTREMIT (MISCELLANEOUS) ×3 IMPLANT
BENZOIN TINCTURE PRP APPL 2/3 (GAUZE/BANDAGES/DRESSINGS) ×3 IMPLANT
CANISTER SUCTION 2500CC (MISCELLANEOUS) ×3 IMPLANT
CANNULA VESSEL 3MM 2 BLNT TIP (CANNULA) ×3 IMPLANT
CLIP LIGATING EXTRA MED SLVR (CLIP) ×3 IMPLANT
CLIP LIGATING EXTRA SM BLUE (MISCELLANEOUS) ×3 IMPLANT
CLIP TI MEDIUM 6 (CLIP) ×3 IMPLANT
CLOSURE STERI-STRIP 1/2X4 (GAUZE/BANDAGES/DRESSINGS) ×1
CLOSURE WOUND 1/2 X4 (GAUZE/BANDAGES/DRESSINGS) ×1
CLSR STERI-STRIP ANTIMIC 1/2X4 (GAUZE/BANDAGES/DRESSINGS) ×2 IMPLANT
COVER PROBE W GEL 5X96 (DRAPES) ×3 IMPLANT
DECANTER SPIKE VIAL GLASS SM (MISCELLANEOUS) ×3 IMPLANT
ELECT REM PT RETURN 9FT ADLT (ELECTROSURGICAL) ×3
ELECTRODE REM PT RTRN 9FT ADLT (ELECTROSURGICAL) ×1 IMPLANT
GAUZE SPONGE 4X4 12PLY STRL (GAUZE/BANDAGES/DRESSINGS) IMPLANT
GEL ULTRASOUND 20GR AQUASONIC (MISCELLANEOUS) IMPLANT
GLOVE BIO SURGEON STRL SZ 6.5 (GLOVE) ×2 IMPLANT
GLOVE BIO SURGEONS STRL SZ 6.5 (GLOVE) ×1
GLOVE BIOGEL PI IND STRL 6.5 (GLOVE) ×3 IMPLANT
GLOVE BIOGEL PI INDICATOR 6.5 (GLOVE) ×6
GLOVE ECLIPSE 6.5 STRL STRAW (GLOVE) ×3 IMPLANT
GLOVE SS BIOGEL STRL SZ 7.5 (GLOVE) ×2 IMPLANT
GLOVE SUPERSENSE BIOGEL SZ 7.5 (GLOVE) ×4
GOWN STRL NON-REIN LRG LVL3 (GOWN DISPOSABLE) ×3 IMPLANT
GOWN STRL REUS W/ TWL LRG LVL3 (GOWN DISPOSABLE) ×4 IMPLANT
GOWN STRL REUS W/TWL LRG LVL3 (GOWN DISPOSABLE) ×8
KIT BASIN OR (CUSTOM PROCEDURE TRAY) ×3 IMPLANT
KIT ROOM TURNOVER OR (KITS) ×3 IMPLANT
NS IRRIG 1000ML POUR BTL (IV SOLUTION) ×3 IMPLANT
PACK CV ACCESS (CUSTOM PROCEDURE TRAY) ×3 IMPLANT
PAD ARMBOARD 7.5X6 YLW CONV (MISCELLANEOUS) ×6 IMPLANT
SPONGE GAUZE 4X4 12PLY STER LF (GAUZE/BANDAGES/DRESSINGS) ×3 IMPLANT
STRIP CLOSURE SKIN 1/2X4 (GAUZE/BANDAGES/DRESSINGS) ×2 IMPLANT
SUT PROLENE 6 0 CC (SUTURE) ×6 IMPLANT
SUT VIC AB 3-0 SH 27 (SUTURE) ×2
SUT VIC AB 3-0 SH 27X BRD (SUTURE) ×1 IMPLANT
UNDERPAD 30X30 (UNDERPADS AND DIAPERS) ×3 IMPLANT
WATER STERILE IRR 1000ML POUR (IV SOLUTION) ×3 IMPLANT

## 2016-07-13 NOTE — Anesthesia Preprocedure Evaluation (Signed)
Anesthesia Evaluation  Patient identified by MRN, date of birth, ID band Patient awake    Reviewed: Allergy & Precautions, NPO status , Patient's Chart, lab work & pertinent test results  History of Anesthesia Complications (+) PONV  Airway Mallampati: II       Dental   Pulmonary shortness of breath, pneumonia, former smoker,    breath sounds clear to auscultation       Cardiovascular hypertension, + CAD, + Past MI and +CHF   Rhythm:Regular Rate:Normal     Neuro/Psych    GI/Hepatic Neg liver ROS, GERD  ,  Endo/Other  diabetes  Renal/GU Renal disease     Musculoskeletal   Abdominal   Peds  Hematology   Anesthesia Other Findings   Reproductive/Obstetrics                             Anesthesia Physical Anesthesia Plan  ASA: III  Anesthesia Plan: General and MAC   Post-op Pain Management:    Induction: Intravenous  Airway Management Planned: Simple Face Mask  Additional Equipment:   Intra-op Plan:   Post-operative Plan:   Informed Consent: I have reviewed the patients History and Physical, chart, labs and discussed the procedure including the risks, benefits and alternatives for the proposed anesthesia with the patient or authorized representative who has indicated his/her understanding and acceptance.   Dental advisory given  Plan Discussed with: CRNA and Anesthesiologist  Anesthesia Plan Comments:         Anesthesia Quick Evaluation

## 2016-07-13 NOTE — Transfer of Care (Signed)
Immediate Anesthesia Transfer of Care Note  Patient: Kyle [name]  Procedure(s) Performed: Procedure(s): ARTERIOVENOUS (AV) FISTULA CREATION RIGHT LOWER ARM (Right)  Patient Location: PACU  Anesthesia Type:MAC  Level of Consciousness: awake, alert  and oriented  Airway & Oxygen Therapy: Patient Spontanous Breathing and Patient connected to nasal cannula oxygen  Post-op Assessment: Report given to RN, Post -op Vital signs reviewed and stable and Patient moving all extremities X 4  Post vital signs: Reviewed and stable  Last Vitals:  Vitals:   07/13/16 0809 07/13/16 1125  BP: (!) 154/39 (!) (P) 151/57  Pulse: (!) 102   Resp: 18   Temp: 36.8 C (P) 36.4 C    Last Pain:  Vitals:   07/13/16 1125  TempSrc:   PainSc: (P) 0-No pain         Complications: No apparent anesthesia complications

## 2016-07-13 NOTE — H&P (View-Only) (Signed)
[facility]ist of [place]  Patient name: Kyle [name] MRN: [id number] DOB: Oct 10, 1940 Sex: male  REASON FOR CONSULT: Discuss hemodialysis access  HPI: Kyle [name] is a 75 y.o. male, who is her today for discussion of AV access for hemodialysis. He is here today with his wife and daughter. He reports progressive renal insufficiency. He is not currently on hemodialysis. Does have a history of coronary bypass grafting in the past and had left radial artery harvest for this. He also has anemia and is undergoing Procrit injections  Past Medical History:  Diagnosis Date  . Atrial fibrillation ([place])   . BPH (benign prostatic hyperplasia)   . CAD (coronary artery disease)   . Chronic systolic (congestive) heart failure   . CKD (chronic kidney disease), stage IV ([place])   . Depression with anxiety   . 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 Essential hypertension   . GERD (gastroesophageal reflux disease)   . GERD (gastroesophageal reflux disease)   . HLD (hyperlipidemia)   . Myocardial infarction   . Pneumonia   . PONV (postoperative nausea and vomiting)   . Shortness of breath dyspnea     Family History  Problem Relation Age of Onset  . Heart disease Mother   . Heart disease Father   . Lung cancer Brother   . Diabetes Sister     SOCIAL HISTORY: Social History   Social History  . Marital status: Married    Spouse name: N/A  . Number of children: N/A  . Years of education: N/A   Occupational History  . Not on file.   Social History Main Topics  . Smoking status: Former Smoker    Quit date: 03/20/1956  . Smokeless tobacco: Never Used  . Alcohol use No  . Drug use: No  . Sexual activity: Not on file   Other Topics Concern  . Not on file   Social History Narrative  . No narrative on file    Allergies  Allergen Reactions  . Statins Other (See Comments)    Pt has tried 4-5  diff kinds. 03/20/2016 Pt reports no reaction or allergy, but has tried different types of statins to figure out which works best for him.     Current Outpatient Prescriptions  Medication Sig Dispense Refill  . allopurinol (ZYLOPRIM) 100 MG tablet Take 100 mg by mouth daily.    Marland Kitchen amLODipine (NORVASC) 5 MG tablet Take 1 tablet (5 mg total) by mouth daily. 30 tablet 0  . apixaban (ELIQUIS) 2.5 MG TABS tablet Take 2.5 mg by mouth 2 (two) times daily.    Marland Kitchen aspirin EC 81 MG EC tablet Take 1 tablet (81 mg total) by mouth daily.    . Calcium Carb-Cholecalciferol (CALTRATE 600+D3 SOFT PO) Take 1 tablet by mouth daily.    . carvedilol (COREG) 25 MG tablet Take 25 mg by mouth 2 (two) times daily with a meal.    . cloNIDine (CATAPRES - DOSED IN MG/24 HR) 0.2 mg/24hr patch Place 1 patch (0.2 mg total) onto the skin once a week. 3 patch 0  . clopidogrel (PLAVIX) 75 MG tablet Take 1 tablet (75 mg total) by mouth daily. 30 tablet 1  . cyanocobalamin (,VITAMIN B-12,) 1000 MCG/ML injection Inject 1,000 mcg into the muscle every 30 (thirty) days.    . ferrous sulfate 325 (65 FE) MG tablet Take 325 mg by mouth daily with breakfast.    .  fexofenadine (ALLEGRA) 180 MG tablet Take 180 mg by mouth daily.    . hydrALAZINE (APRESOLINE) 50 MG tablet Take 50 mg by mouth 3 (three) times daily.    . insulin aspart (NOVOLOG) 100 UNIT/ML injection Inject 5 Units into the skin See admin instructions. Per sliding scale    . insulin NPH Human (NOVOLIN N) 100 UNIT/ML injection Inject 35 Units into the skin 2 (two) times daily.    . isosorbide mononitrate (IMDUR) 60 MG 24 hr tablet Take 60 mg by mouth daily.    . Mupirocin (BACTROBAN EX) Apply 1 application topically daily as needed (irritation).    Marland Kitchen omeprazole (PRILOSEC) 20 MG capsule Take 20 mg by mouth daily.    Marland Kitchen oxyCODONE (OXY IR/ROXICODONE) 5 MG immediate release tablet Take 5 mg by mouth every 6 (six) hours as needed for moderate pain or severe pain.    . Probiotic Product  (PROBIOTIC MULTI-ENZYME PO) Take 1 tablet by mouth daily.    . ranolazine (RANEXA) 500 MG 12 hr tablet Take 1 tablet (500 mg total) by mouth 2 (two) times daily. 60 tablet 0  . rOPINIRole (REQUIP) 1 MG tablet Take 1 mg by mouth at bedtime as needed (RLS).    Marland Kitchen rosuvastatin (CRESTOR) 20 MG tablet Take 20 mg by mouth daily.    . tamsulosin (FLOMAX) 0.4 MG CAPS capsule Take 0.4 mg by mouth at bedtime.    . torsemide (DEMADEX) 20 MG tablet Take 2 tablets (40 mg total) by mouth 2 (two) times daily. 60 tablet 0  . Ubiquinol 50 MG CAPS Take 1 capsule by mouth daily.     No current facility-administered medications for this visit.     REVIEW OF SYSTEMS:  [X]  denotes positive finding, [ ]  denotes negative finding Cardiac  Comments:  Chest pain or chest pressure:     Shortness of breath upon exertion:    Short of breath when lying flat:    Irregular heart rhythm:        Vascular    Pain in calf, thigh, or hip brought on by ambulation:    Pain in feet at night that wakes you up from your sleep:     Blood clot in your veins:    Leg swelling:         Pulmonary    Oxygen at home:    Productive cough:     Wheezing:         Neurologic    Sudden weakness in arms or legs:     Sudden numbness in arms or legs:     Sudden onset of difficulty speaking or slurred speech:    Temporary loss of vision in one eye:     Problems with dizziness:         Gastrointestinal    Blood in stool:     Vomited blood:         Genitourinary    Burning when urinating:     Blood in urine:        Psychiatric    Major depression:         Hematologic    Bleeding problems:    Problems with blood clotting too easily:        Skin    Rashes or ulcers:        Constitutional    Fever or chills:      PHYSICAL EXAM: Vitals:   07/01/16 1232 07/01/16 1235  BP: (!) 150/60 (!) 149/71  Pulse: 60  Resp: 18   Temp: 97.3 F (36.3 C)   TempSrc: Oral   SpO2: 98%   Weight: 188 lb (85.3 kg)   Height: 5\' 8"   (1.727 m)     GENERAL: The patient is a well-nourished male, in no acute distress. The vital signs are documented above. CARDIOVASCULAR: 2+ radial pulses bilaterally. He does have a radial harvest incision on the left but does have a 2+ radial pulse related to collaterals from his ulnar. Rotted arteries without bruits bilaterally Relatively small surface veins although his right cephalic vein at the wrist and antecubital are patent and visible. PULMONARY: There is good air exchange  ABDOMEN: Soft and non-tender  MUSCULOSKELETAL: There are no major deformities or cyanosis. NEUROLOGIC: No focal weakness or paresthesias are detected. SKIN: There are no ulcers or rashes noted. PSYCHIATRIC: The patient has a normal affect.  DATA:  Did undergo venous and arterial studies in his upper extremities. This does show normal arterial flow bilaterally. He has very small cephalic vein on the left and moderate cephalic vein on the right  MEDICAL ISSUES: Long discussion with patient and his family regarding options for hemodialysis. Discussed catheter for short-term access and AV fistula and AV graft for long-term access. I feel that he is a candidate for fistula attempt. He does have moderate size right cephalic vein and therefore would recommend right arm fistula attempt. He is right-handed. He has had the left radial artery harvest for heart bypass. We'll schedule this at his earliest convenience as an outpatient. Will stop hisEliquis for surgery and resume immediately thereafter   [name], MD [facility] V[facility] and Vein Specialists of [facility] Tel [phone] Pager [phone]

## 2016-07-13 NOTE — Anesthesia Postprocedure Evaluation (Signed)
Anesthesia Post Note  Patient: Kyle [name]  Procedure(s) Performed: Procedure(s) (LRB): ARTERIOVENOUS (AV) FISTULA CREATION RIGHT LOWER ARM (Right)  Patient location during evaluation: PACU Anesthesia Type: MAC Level of consciousness: awake Pain management: pain level controlled Vital Signs Assessment: post-procedure vital signs reviewed and stable Cardiovascular status: stable Anesthetic complications: no    Last Vitals:  Vitals:   07/13/16 1145 07/13/16 1153  BP: (!) 161/57 (!) 160/54  Pulse: 60 64  Resp: 10 16  Temp: 36.6 C     Last Pain:  Vitals:   07/13/16 1145  TempSrc:   PainSc: 0-No pain                 EDWARDS,Grayce Budden

## 2016-07-13 NOTE — Interval H&P Note (Signed)
History and Physical Interval Note:  07/13/2016 7:56 AM  Kyle [name]  has presented today for surgery, with the diagnosis of Chronic Renal Disease stage 4  The various methods of treatment have been discussed with the patient and family. After consideration of risks, benefits and other options for treatment, the patient has consented to  Procedure(s): ARTERIOVENOUS (AV) FISTULA CREATION (Right) as a surgical intervention .  The patient's history has been reviewed, patient examined, no change in status, stable for surgery.  I have reviewed the patient's chart and labs.  Questions were answered to the patient's satisfaction.     Curt Jews

## 2016-07-13 NOTE — Op Note (Signed)
    OPERATIVE REPORT  DATE OF SURGERY: 07/13/2016  PATIENT: Kyle [name], 75 y.o. male MRN: [id number]  DOB: March 05, 1941  PRE-OPERATIVE DIAGNOSIS: Chronic renal insufficiency  POST-OPERATIVE DIAGNOSIS:  Same  PROCEDURE: Right upper arm brachiocephalic AV fistula creation  SURGEON:  Curt Jews, M.D.  PHYSICIAN ASSISTANT: Silva Bandy PA-C  ANESTHESIA:  Local with sedation  EBL: Minimal ml  Total I/O In: -  Out: 50 [Other:25; Blood:25]  BLOOD ADMINISTERED: None  DRAINS: None  SPECIMEN: None  COUNTS CORRECT:  YES  PLAN OF CARE: PACU   PATIENT DISPOSITION:  PACU - hemodynamically stable  PROCEDURE DETAILS: Patient was taken to the operative placed supine position where the area of the right arm prepped in sterile fashion. Incision was made over the antecubital space ^ M isolate the cephalic vein and the radial artery. The artery was of good caliber. The cephalic vein is been used for IV access and was somewhat thickened but was patent. The vein was mobilized and the superior branches distally were ligated and divided. The vein was divided and was mobilized to the level of the brachial artery. The brachial artery was occluded proximally and distally was opened with 11 blade and some assuming with Potts scissors. The vein was cut to appropriate length and was spatulated and sewn end-to-side to the artery with a running 6-0 Prolene suture. Clamps removed and excellent thrill was noted. The patient continued to have a palpable right radial pulse. The wound irrigated with saline. Hemostasis tablet cautery. Wounds were closed with 3-0 Vicryl in the subcutaneous and subcuticular tissue. Benzoin and Steri-Strip for applied and the patient was transferred to the recovery room in stable condition   [name], M.D., [facility] 07/13/2016 11:23 AM

## 2016-07-14 ENCOUNTER — Telehealth: Payer: Self-pay | Admitting: Vascular Surgery

## 2016-07-14 ENCOUNTER — Encounter (HOSPITAL_COMMUNITY): Payer: Self-pay | Admitting: Vascular Surgery

## 2016-07-14 NOTE — Telephone Encounter (Signed)
Spoke to spouse about f/u appt on 12/12 w/ [place]  mailing letter

## 2016-07-14 NOTE — Telephone Encounter (Signed)
-----   Message from Mena Goes, RN sent at 07/13/2016  2:00 PM EDT ----- Regarding: 4-6 weeks w/ duplex   ----- Message ----- From: Alvia Grove, PA-C Sent: 07/13/2016  11:26 AM To: Vvs Charge Pool  S/p left brachial-cephalic AVF [id number]  F/u with Dr. Donnetta Hutching in 4-6 weeks with duplex.  Thanks Maudie Mercury

## 2016-07-16 DIAGNOSIS — Z6825 Body mass index (BMI) 25.0-25.9, adult: Secondary | ICD-10-CM | POA: Diagnosis not present

## 2016-07-16 DIAGNOSIS — E538 Deficiency of other specified B group vitamins: Secondary | ICD-10-CM | POA: Diagnosis not present

## 2016-07-16 DIAGNOSIS — N184 Chronic kidney disease, stage 4 (severe): Secondary | ICD-10-CM | POA: Diagnosis not present

## 2016-07-16 DIAGNOSIS — J189 Pneumonia, unspecified organism: Secondary | ICD-10-CM | POA: Diagnosis not present

## 2016-07-16 DIAGNOSIS — J449 Chronic obstructive pulmonary disease, unspecified: Secondary | ICD-10-CM | POA: Diagnosis not present

## 2016-07-16 DIAGNOSIS — Z79899 Other long term (current) drug therapy: Secondary | ICD-10-CM | POA: Diagnosis not present

## 2016-07-24 DIAGNOSIS — J449 Chronic obstructive pulmonary disease, unspecified: Secondary | ICD-10-CM | POA: Diagnosis not present

## 2016-07-28 DIAGNOSIS — E669 Obesity, unspecified: Secondary | ICD-10-CM | POA: Diagnosis not present

## 2016-07-28 DIAGNOSIS — D631 Anemia in chronic kidney disease: Secondary | ICD-10-CM | POA: Diagnosis not present

## 2016-07-28 DIAGNOSIS — N2581 Secondary hyperparathyroidism of renal origin: Secondary | ICD-10-CM | POA: Diagnosis not present

## 2016-07-28 DIAGNOSIS — N185 Chronic kidney disease, stage 5: Secondary | ICD-10-CM | POA: Diagnosis not present

## 2016-07-28 DIAGNOSIS — I10 Essential (primary) hypertension: Secondary | ICD-10-CM | POA: Diagnosis not present

## 2016-07-30 DIAGNOSIS — N184 Chronic kidney disease, stage 4 (severe): Secondary | ICD-10-CM | POA: Diagnosis not present

## 2016-07-30 DIAGNOSIS — E119 Type 2 diabetes mellitus without complications: Secondary | ICD-10-CM | POA: Diagnosis not present

## 2016-07-30 DIAGNOSIS — E785 Hyperlipidemia, unspecified: Secondary | ICD-10-CM | POA: Diagnosis not present

## 2016-07-30 DIAGNOSIS — I4891 Unspecified atrial fibrillation: Secondary | ICD-10-CM | POA: Diagnosis not present

## 2016-07-30 DIAGNOSIS — I509 Heart failure, unspecified: Secondary | ICD-10-CM | POA: Diagnosis not present

## 2016-07-30 DIAGNOSIS — J449 Chronic obstructive pulmonary disease, unspecified: Secondary | ICD-10-CM | POA: Diagnosis not present

## 2016-07-30 DIAGNOSIS — Z9181 History of falling: Secondary | ICD-10-CM | POA: Diagnosis not present

## 2016-07-30 DIAGNOSIS — I2510 Atherosclerotic heart disease of native coronary artery without angina pectoris: Secondary | ICD-10-CM | POA: Diagnosis not present

## 2016-08-03 DIAGNOSIS — N184 Chronic kidney disease, stage 4 (severe): Secondary | ICD-10-CM | POA: Diagnosis not present

## 2016-08-03 DIAGNOSIS — D509 Iron deficiency anemia, unspecified: Secondary | ICD-10-CM | POA: Diagnosis not present

## 2016-08-03 DIAGNOSIS — D638 Anemia in other chronic diseases classified elsewhere: Secondary | ICD-10-CM | POA: Diagnosis not present

## 2016-08-05 DIAGNOSIS — D509 Iron deficiency anemia, unspecified: Secondary | ICD-10-CM | POA: Diagnosis not present

## 2016-08-09 DIAGNOSIS — J449 Chronic obstructive pulmonary disease, unspecified: Secondary | ICD-10-CM | POA: Diagnosis not present

## 2016-08-10 DIAGNOSIS — D631 Anemia in chronic kidney disease: Secondary | ICD-10-CM | POA: Diagnosis not present

## 2016-08-10 DIAGNOSIS — N184 Chronic kidney disease, stage 4 (severe): Secondary | ICD-10-CM | POA: Diagnosis not present

## 2016-08-17 DIAGNOSIS — D638 Anemia in other chronic diseases classified elsewhere: Secondary | ICD-10-CM | POA: Diagnosis not present

## 2016-08-17 DIAGNOSIS — N184 Chronic kidney disease, stage 4 (severe): Secondary | ICD-10-CM | POA: Diagnosis not present

## 2016-08-19 ENCOUNTER — Encounter: Payer: Self-pay | Admitting: Vascular Surgery

## 2016-08-20 DIAGNOSIS — E785 Hyperlipidemia, unspecified: Secondary | ICD-10-CM | POA: Diagnosis not present

## 2016-08-20 DIAGNOSIS — E538 Deficiency of other specified B group vitamins: Secondary | ICD-10-CM | POA: Diagnosis not present

## 2016-08-20 DIAGNOSIS — D649 Anemia, unspecified: Secondary | ICD-10-CM | POA: Diagnosis not present

## 2016-08-20 DIAGNOSIS — I509 Heart failure, unspecified: Secondary | ICD-10-CM | POA: Diagnosis not present

## 2016-08-20 DIAGNOSIS — N189 Chronic kidney disease, unspecified: Secondary | ICD-10-CM | POA: Diagnosis not present

## 2016-08-20 DIAGNOSIS — Z6825 Body mass index (BMI) 25.0-25.9, adult: Secondary | ICD-10-CM | POA: Diagnosis not present

## 2016-08-20 DIAGNOSIS — N401 Enlarged prostate with lower urinary tract symptoms: Secondary | ICD-10-CM | POA: Diagnosis not present

## 2016-08-20 DIAGNOSIS — K210 Gastro-esophageal reflux disease with esophagitis: Secondary | ICD-10-CM | POA: Diagnosis not present

## 2016-08-20 DIAGNOSIS — G2581 Restless legs syndrome: Secondary | ICD-10-CM | POA: Diagnosis not present

## 2016-08-20 DIAGNOSIS — M1990 Unspecified osteoarthritis, unspecified site: Secondary | ICD-10-CM | POA: Diagnosis not present

## 2016-08-20 DIAGNOSIS — E119 Type 2 diabetes mellitus without complications: Secondary | ICD-10-CM | POA: Diagnosis not present

## 2016-08-20 DIAGNOSIS — I2510 Atherosclerotic heart disease of native coronary artery without angina pectoris: Secondary | ICD-10-CM | POA: Diagnosis not present

## 2016-08-20 DIAGNOSIS — I4891 Unspecified atrial fibrillation: Secondary | ICD-10-CM | POA: Diagnosis not present

## 2016-08-21 ENCOUNTER — Other Ambulatory Visit: Payer: Self-pay

## 2016-08-21 DIAGNOSIS — N184 Chronic kidney disease, stage 4 (severe): Secondary | ICD-10-CM

## 2016-08-23 DIAGNOSIS — J449 Chronic obstructive pulmonary disease, unspecified: Secondary | ICD-10-CM | POA: Diagnosis not present

## 2016-08-24 DIAGNOSIS — N184 Chronic kidney disease, stage 4 (severe): Secondary | ICD-10-CM | POA: Diagnosis not present

## 2016-08-24 DIAGNOSIS — D638 Anemia in other chronic diseases classified elsewhere: Secondary | ICD-10-CM | POA: Diagnosis not present

## 2016-08-25 ENCOUNTER — Ambulatory Visit (INDEPENDENT_AMBULATORY_CARE_PROVIDER_SITE_OTHER): Payer: Self-pay | Admitting: Vascular Surgery

## 2016-08-25 ENCOUNTER — Ambulatory Visit (HOSPITAL_COMMUNITY)
Admission: RE | Admit: 2016-08-25 | Discharge: 2016-08-25 | Disposition: A | Discharge status: Home / Self Care | Payer: PPO | Source: Ambulatory Visit | Attending: Vascular Surgery | Admitting: Vascular Surgery

## 2016-08-25 ENCOUNTER — Encounter: Payer: Self-pay | Admitting: Vascular Surgery

## 2016-08-25 VITALS — BP 130/50 | HR 68 | Temp 97.4°F | Resp 16 | Ht 68.0 in | Wt 185.0 lb

## 2016-08-25 DIAGNOSIS — N184 Chronic kidney disease, stage 4 (severe): Secondary | ICD-10-CM | POA: Insufficient documentation

## 2016-08-25 DIAGNOSIS — Z9889 Other specified postprocedural states: Secondary | ICD-10-CM | POA: Insufficient documentation

## 2016-08-25 DIAGNOSIS — Z95828 Presence of other vascular implants and grafts: Secondary | ICD-10-CM | POA: Diagnosis not present

## 2016-08-25 NOTE — Progress Notes (Signed)
Patient name: Kyle [name] MRN: [id number] DOB: 1941-05-15 Sex: male  REASON FOR VISIT: Follow-up right brachiocephalic AV fistula placement on 07/13/2016  HPI: Kyle [name] is a 75 y.o. male here for follow-up. He is not on hemodialysis currently. He has had no difficulty regarding his right upper arm AV fistula. He did have left radial harvest for coronary bypass.  Current Outpatient Prescriptions  Medication Sig Dispense Refill  . allopurinol (ZYLOPRIM) 100 MG tablet Take 100 mg by mouth daily.    Marland Kitchen apixaban (ELIQUIS) 2.5 MG TABS tablet Take 1 tablet (2.5 mg total) by mouth 2 (two) times daily. 60 tablet 0  . aspirin EC 81 MG tablet Take 81 mg by mouth daily.    . Calcium Carb-Cholecalciferol (CALTRATE 600+D3 SOFT PO) Take 1 tablet by mouth daily.    . carvedilol (COREG) 25 MG tablet Take 25 mg by mouth 2 (two) times daily with a meal.    . cloNIDine (CATAPRES - DOSED IN MG/24 HR) 0.2 mg/24hr patch Place 1 patch (0.2 mg total) onto the skin once a week. 3 patch 0  . clopidogrel (PLAVIX) 75 MG tablet Take 75 mg by mouth daily.    . cyanocobalamin (,VITAMIN B-12,) 1000 MCG/ML injection Inject 1,000 mcg into the muscle every 30 (thirty) days.    . ferrous sulfate 325 (65 FE) MG tablet Take 325 mg by mouth daily with breakfast.    . fexofenadine (ALLEGRA) 180 MG tablet Take 180 mg by mouth daily.    . hydrALAZINE (APRESOLINE) 50 MG tablet Take 50 mg by mouth 3 (three) times daily.    . insulin aspart (NOVOLOG) 100 UNIT/ML injection Inject 15-30 Units into the skin 3 (three) times daily as needed for high blood sugar. Per sliding scale     . insulin NPH Human (NOVOLIN N) 100 UNIT/ML injection Inject 30 Units into the skin 2 (two) times daily.     . Mupirocin (BACTROBAN EX) Apply 1 application topically daily as needed (irritation).    . nitroGLYCERIN (NITROSTAT) 0.4 MG SL tablet Place 0.4 mg under the tongue every 5 (five) minutes as needed for chest pain.     Marland Kitchen omeprazole (PRILOSEC) 20 MG capsule Take 20 mg by mouth daily.    . ondansetron (ZOFRAN-ODT) 4 MG disintegrating tablet Take 4 mg by mouth every 4 (four) hours as needed for nausea or vomiting.    Marland Kitchen oxyCODONE (OXY IR/ROXICODONE) 5 MG immediate release tablet Take 1 tablet (5 mg total) by mouth every 6 (six) hours as needed for moderate pain or severe pain. 6 tablet 0  . Probiotic Product (PROBIOTIC MULTI-ENZYME PO) Take 1 tablet by mouth daily.    . ranolazine (RANEXA) 500 MG 12 hr tablet Take 1 tablet (500 mg total) by mouth 2 (two) times daily. 60 tablet 0  . rOPINIRole (REQUIP) 1 MG tablet Take 1 mg by mouth at bedtime as needed (RLS).    Marland Kitchen rosuvastatin (CRESTOR) 20 MG tablet Take 20 mg by mouth daily.    . tamsulosin (FLOMAX) 0.4 MG CAPS capsule Take 0.8 mg by mouth at bedtime.     . torsemide (DEMADEX) 100 MG tablet Take 1 tablet (100 mg total) by mouth daily. 30 tablet 0   No current facility-administered medications for this visit.      PHYSICAL EXAM: Vitals:   08/25/16 0910  BP: (!) 130/50  Pulse: 68  Resp: 16  Temp: 97.4 F (36.3 C)  TempSrc: Oral  SpO2: 98%  Weight: 185  lb (83.9 kg)  Height: 5\' 8"  (1.727 m)    GENERAL: The patient is a well-nourished male, in no acute distress. The vital signs are documented above. His right antecubital incision is completely healed. He has excellent thrill in his fistula with a nice maturation throughout his upper arm. The fistula does work run relatively close to the surface.  MEDICAL ISSUES: Stable 5 weeks out from radiocephalic fistula. He did undergo duplex imaging of this today. Shows good diameter maturation throughout its course. There is one area in the mid upper arm where the diameter is approximate 4 mm as compared to 6 mm on either side. Appears that there may be some chronic thrombus in an adjacent branch. Do not feel that this puts his fistula at risk. No evidence of steal. We'll continue using his arm I routinely and  hopefully this will provide access for hemodialysis should he progressed to renal failure. He will see [place] again on as-needed basis   [name], MD [facility] Vascular and Vein Specialists of [facility] Tel [phone] Pager [phone]

## 2016-08-31 DIAGNOSIS — N184 Chronic kidney disease, stage 4 (severe): Secondary | ICD-10-CM | POA: Diagnosis not present

## 2016-08-31 DIAGNOSIS — D631 Anemia in chronic kidney disease: Secondary | ICD-10-CM | POA: Diagnosis not present

## 2016-09-01 DIAGNOSIS — I447 Left bundle-branch block, unspecified: Secondary | ICD-10-CM | POA: Diagnosis not present

## 2016-09-01 DIAGNOSIS — J9690 Respiratory failure, unspecified, unspecified whether with hypoxia or hypercapnia: Secondary | ICD-10-CM | POA: Diagnosis not present

## 2016-09-01 DIAGNOSIS — N178 Other acute kidney failure: Secondary | ICD-10-CM | POA: Diagnosis not present

## 2016-09-01 DIAGNOSIS — I5033 Acute on chronic diastolic (congestive) heart failure: Secondary | ICD-10-CM | POA: Diagnosis not present

## 2016-09-01 DIAGNOSIS — Z7901 Long term (current) use of anticoagulants: Secondary | ICD-10-CM | POA: Diagnosis not present

## 2016-09-01 DIAGNOSIS — J9601 Acute respiratory failure with hypoxia: Secondary | ICD-10-CM | POA: Diagnosis not present

## 2016-09-01 DIAGNOSIS — R069 Unspecified abnormalities of breathing: Secondary | ICD-10-CM | POA: Diagnosis not present

## 2016-09-01 DIAGNOSIS — I110 Hypertensive heart disease with heart failure: Secondary | ICD-10-CM

## 2016-09-01 DIAGNOSIS — Z8249 Family history of ischemic heart disease and other diseases of the circulatory system: Secondary | ICD-10-CM | POA: Diagnosis not present

## 2016-09-01 DIAGNOSIS — E119 Type 2 diabetes mellitus without complications: Secondary | ICD-10-CM

## 2016-09-01 DIAGNOSIS — J189 Pneumonia, unspecified organism: Secondary | ICD-10-CM | POA: Diagnosis not present

## 2016-09-01 DIAGNOSIS — I252 Old myocardial infarction: Secondary | ICD-10-CM | POA: Diagnosis not present

## 2016-09-01 DIAGNOSIS — J449 Chronic obstructive pulmonary disease, unspecified: Secondary | ICD-10-CM | POA: Diagnosis not present

## 2016-09-01 DIAGNOSIS — N400 Enlarged prostate without lower urinary tract symptoms: Secondary | ICD-10-CM | POA: Diagnosis not present

## 2016-09-01 DIAGNOSIS — E785 Hyperlipidemia, unspecified: Secondary | ICD-10-CM | POA: Diagnosis not present

## 2016-09-01 DIAGNOSIS — D638 Anemia in other chronic diseases classified elsewhere: Secondary | ICD-10-CM | POA: Diagnosis not present

## 2016-09-01 DIAGNOSIS — Z79899 Other long term (current) drug therapy: Secondary | ICD-10-CM | POA: Diagnosis not present

## 2016-09-01 DIAGNOSIS — R0602 Shortness of breath: Secondary | ICD-10-CM | POA: Diagnosis not present

## 2016-09-01 DIAGNOSIS — Z7902 Long term (current) use of antithrombotics/antiplatelets: Secondary | ICD-10-CM | POA: Diagnosis not present

## 2016-09-01 DIAGNOSIS — Z7982 Long term (current) use of aspirin: Secondary | ICD-10-CM | POA: Diagnosis not present

## 2016-09-01 DIAGNOSIS — Z951 Presence of aortocoronary bypass graft: Secondary | ICD-10-CM | POA: Diagnosis not present

## 2016-09-01 DIAGNOSIS — I25118 Atherosclerotic heart disease of native coronary artery with other forms of angina pectoris: Secondary | ICD-10-CM | POA: Diagnosis not present

## 2016-09-01 DIAGNOSIS — Z794 Long term (current) use of insulin: Secondary | ICD-10-CM | POA: Diagnosis not present

## 2016-09-01 DIAGNOSIS — I10 Essential (primary) hypertension: Secondary | ICD-10-CM | POA: Diagnosis not present

## 2016-09-01 DIAGNOSIS — I214 Non-ST elevation (NSTEMI) myocardial infarction: Secondary | ICD-10-CM | POA: Diagnosis not present

## 2016-09-01 DIAGNOSIS — N184 Chronic kidney disease, stage 4 (severe): Secondary | ICD-10-CM | POA: Diagnosis not present

## 2016-09-01 DIAGNOSIS — I130 Hypertensive heart and chronic kidney disease with heart failure and stage 1 through stage 4 chronic kidney disease, or unspecified chronic kidney disease: Secondary | ICD-10-CM | POA: Diagnosis not present

## 2016-09-01 DIAGNOSIS — K219 Gastro-esophageal reflux disease without esophagitis: Secondary | ICD-10-CM | POA: Diagnosis not present

## 2016-09-01 DIAGNOSIS — I4891 Unspecified atrial fibrillation: Secondary | ICD-10-CM | POA: Diagnosis not present

## 2016-09-01 DIAGNOSIS — I2510 Atherosclerotic heart disease of native coronary artery without angina pectoris: Secondary | ICD-10-CM | POA: Diagnosis not present

## 2016-09-01 DIAGNOSIS — I5023 Acute on chronic systolic (congestive) heart failure: Secondary | ICD-10-CM | POA: Diagnosis not present

## 2016-09-01 DIAGNOSIS — E1122 Type 2 diabetes mellitus with diabetic chronic kidney disease: Secondary | ICD-10-CM | POA: Diagnosis not present

## 2016-09-02 DIAGNOSIS — I214 Non-ST elevation (NSTEMI) myocardial infarction: Secondary | ICD-10-CM | POA: Diagnosis not present

## 2016-09-02 DIAGNOSIS — I25118 Atherosclerotic heart disease of native coronary artery with other forms of angina pectoris: Secondary | ICD-10-CM | POA: Diagnosis not present

## 2016-09-02 DIAGNOSIS — I509 Heart failure, unspecified: Secondary | ICD-10-CM | POA: Diagnosis not present

## 2016-09-02 DIAGNOSIS — R748 Abnormal levels of other serum enzymes: Secondary | ICD-10-CM | POA: Diagnosis not present

## 2016-09-02 DIAGNOSIS — N178 Other acute kidney failure: Secondary | ICD-10-CM | POA: Diagnosis not present

## 2016-09-02 DIAGNOSIS — I5033 Acute on chronic diastolic (congestive) heart failure: Secondary | ICD-10-CM | POA: Diagnosis not present

## 2016-09-02 DIAGNOSIS — E784 Other hyperlipidemia: Secondary | ICD-10-CM | POA: Diagnosis not present

## 2016-09-02 DIAGNOSIS — I4891 Unspecified atrial fibrillation: Secondary | ICD-10-CM | POA: Diagnosis not present

## 2016-09-03 DIAGNOSIS — I5033 Acute on chronic diastolic (congestive) heart failure: Secondary | ICD-10-CM | POA: Diagnosis not present

## 2016-09-03 DIAGNOSIS — I4891 Unspecified atrial fibrillation: Secondary | ICD-10-CM | POA: Diagnosis not present

## 2016-09-03 DIAGNOSIS — E784 Other hyperlipidemia: Secondary | ICD-10-CM | POA: Diagnosis not present

## 2016-09-03 DIAGNOSIS — R748 Abnormal levels of other serum enzymes: Secondary | ICD-10-CM | POA: Diagnosis not present

## 2016-09-03 DIAGNOSIS — N178 Other acute kidney failure: Secondary | ICD-10-CM | POA: Diagnosis not present

## 2016-09-03 DIAGNOSIS — I25118 Atherosclerotic heart disease of native coronary artery with other forms of angina pectoris: Secondary | ICD-10-CM | POA: Diagnosis not present

## 2016-09-03 DIAGNOSIS — I214 Non-ST elevation (NSTEMI) myocardial infarction: Secondary | ICD-10-CM

## 2016-09-08 DIAGNOSIS — D638 Anemia in other chronic diseases classified elsewhere: Secondary | ICD-10-CM | POA: Diagnosis not present

## 2016-09-08 DIAGNOSIS — N184 Chronic kidney disease, stage 4 (severe): Secondary | ICD-10-CM | POA: Diagnosis not present

## 2016-09-08 DIAGNOSIS — J449 Chronic obstructive pulmonary disease, unspecified: Secondary | ICD-10-CM | POA: Diagnosis not present

## 2016-09-10 DIAGNOSIS — I509 Heart failure, unspecified: Secondary | ICD-10-CM | POA: Diagnosis not present

## 2016-09-10 DIAGNOSIS — K210 Gastro-esophageal reflux disease with esophagitis: Secondary | ICD-10-CM | POA: Diagnosis not present

## 2016-09-10 DIAGNOSIS — I2510 Atherosclerotic heart disease of native coronary artery without angina pectoris: Secondary | ICD-10-CM | POA: Diagnosis not present

## 2016-09-10 DIAGNOSIS — Z6825 Body mass index (BMI) 25.0-25.9, adult: Secondary | ICD-10-CM | POA: Diagnosis not present

## 2016-09-10 DIAGNOSIS — Z79899 Other long term (current) drug therapy: Secondary | ICD-10-CM | POA: Diagnosis not present

## 2016-09-10 DIAGNOSIS — I4891 Unspecified atrial fibrillation: Secondary | ICD-10-CM | POA: Diagnosis not present

## 2016-09-10 DIAGNOSIS — E785 Hyperlipidemia, unspecified: Secondary | ICD-10-CM | POA: Diagnosis not present

## 2016-09-10 DIAGNOSIS — I10 Essential (primary) hypertension: Secondary | ICD-10-CM | POA: Diagnosis not present

## 2016-09-10 DIAGNOSIS — L570 Actinic keratosis: Secondary | ICD-10-CM | POA: Diagnosis not present

## 2016-09-10 DIAGNOSIS — E119 Type 2 diabetes mellitus without complications: Secondary | ICD-10-CM | POA: Diagnosis not present

## 2016-09-10 DIAGNOSIS — N401 Enlarged prostate with lower urinary tract symptoms: Secondary | ICD-10-CM | POA: Diagnosis not present

## 2016-09-15 ENCOUNTER — Encounter: Payer: Self-pay | Admitting: *Deleted

## 2016-09-15 ENCOUNTER — Other Ambulatory Visit: Payer: Self-pay | Admitting: *Deleted

## 2016-09-15 DIAGNOSIS — N184 Chronic kidney disease, stage 4 (severe): Secondary | ICD-10-CM | POA: Diagnosis not present

## 2016-09-15 DIAGNOSIS — D638 Anemia in other chronic diseases classified elsewhere: Secondary | ICD-10-CM | POA: Diagnosis not present

## 2016-09-15 NOTE — Patient Outreach (Signed)
[facility]) Care Management  09/15/2016  Kyle [name] Jun 30, 1941 [id number]  Referral from HTA member discharged from [place] facility: Admission from 12/19-12/21/2017-[facility].   Telephone call to patient who was advised of reason for call & of [facility] care management services.  HIPPA verification received from patient. Patient voices that he was recently hospitalized at [facility] with fluid build up & difficulty breathing. States he does have heart failure but is doing well now. States he has seen primary care provider for follow up visit since discharged. States he is independent in his care and has no limitations on activity. States his daughter takes him to MD appointments. States no problems getting prescriptions filled & that he is taking medications as prescribed by his doctors. Patient is weighing daily & is aware of reportable signs & symptoms to MD.   Patient voices that he has oxygen to use if needed. Patient voices that he has very strong family support. Voices he does not have any case management needs & or concerns at this time..    Plan: Send [facility] contact information to patient. Send MD closure letter. Close case/send to care management assistant.  Sherrin Daisy, RN BSN [place] Management Coordinator [facility] Care Management  [phone]

## 2016-09-21 DIAGNOSIS — D631 Anemia in chronic kidney disease: Secondary | ICD-10-CM | POA: Diagnosis not present

## 2016-09-21 DIAGNOSIS — N184 Chronic kidney disease, stage 4 (severe): Secondary | ICD-10-CM | POA: Diagnosis not present

## 2016-09-23 DIAGNOSIS — J449 Chronic obstructive pulmonary disease, unspecified: Secondary | ICD-10-CM | POA: Diagnosis not present

## 2016-09-24 DIAGNOSIS — N401 Enlarged prostate with lower urinary tract symptoms: Secondary | ICD-10-CM | POA: Diagnosis not present

## 2016-09-24 DIAGNOSIS — K210 Gastro-esophageal reflux disease with esophagitis: Secondary | ICD-10-CM | POA: Diagnosis not present

## 2016-09-24 DIAGNOSIS — I509 Heart failure, unspecified: Secondary | ICD-10-CM | POA: Diagnosis not present

## 2016-09-24 DIAGNOSIS — Z6825 Body mass index (BMI) 25.0-25.9, adult: Secondary | ICD-10-CM | POA: Diagnosis not present

## 2016-09-24 DIAGNOSIS — I4891 Unspecified atrial fibrillation: Secondary | ICD-10-CM | POA: Diagnosis not present

## 2016-09-24 DIAGNOSIS — I10 Essential (primary) hypertension: Secondary | ICD-10-CM | POA: Diagnosis not present

## 2016-09-24 DIAGNOSIS — Z1389 Encounter for screening for other disorder: Secondary | ICD-10-CM | POA: Diagnosis not present

## 2016-09-24 DIAGNOSIS — N184 Chronic kidney disease, stage 4 (severe): Secondary | ICD-10-CM | POA: Diagnosis not present

## 2016-09-24 DIAGNOSIS — E119 Type 2 diabetes mellitus without complications: Secondary | ICD-10-CM | POA: Diagnosis not present

## 2016-09-24 DIAGNOSIS — E663 Overweight: Secondary | ICD-10-CM | POA: Diagnosis not present

## 2016-09-24 DIAGNOSIS — D649 Anemia, unspecified: Secondary | ICD-10-CM | POA: Diagnosis not present

## 2016-09-24 DIAGNOSIS — I2510 Atherosclerotic heart disease of native coronary artery without angina pectoris: Secondary | ICD-10-CM | POA: Diagnosis not present

## 2016-09-28 DIAGNOSIS — L299 Pruritus, unspecified: Secondary | ICD-10-CM | POA: Diagnosis not present

## 2016-09-28 DIAGNOSIS — N2581 Secondary hyperparathyroidism of renal origin: Secondary | ICD-10-CM | POA: Diagnosis not present

## 2016-09-28 DIAGNOSIS — D631 Anemia in chronic kidney disease: Secondary | ICD-10-CM | POA: Diagnosis not present

## 2016-09-28 DIAGNOSIS — E669 Obesity, unspecified: Secondary | ICD-10-CM | POA: Diagnosis not present

## 2016-09-28 DIAGNOSIS — I10 Essential (primary) hypertension: Secondary | ICD-10-CM | POA: Diagnosis not present

## 2016-09-28 DIAGNOSIS — N185 Chronic kidney disease, stage 5: Secondary | ICD-10-CM | POA: Diagnosis not present

## 2016-09-29 DIAGNOSIS — D638 Anemia in other chronic diseases classified elsewhere: Secondary | ICD-10-CM | POA: Diagnosis not present

## 2016-09-29 DIAGNOSIS — N184 Chronic kidney disease, stage 4 (severe): Secondary | ICD-10-CM | POA: Diagnosis not present

## 2016-10-06 DIAGNOSIS — N184 Chronic kidney disease, stage 4 (severe): Secondary | ICD-10-CM | POA: Diagnosis not present

## 2016-10-06 DIAGNOSIS — D638 Anemia in other chronic diseases classified elsewhere: Secondary | ICD-10-CM | POA: Diagnosis not present

## 2016-10-08 DIAGNOSIS — E119 Type 2 diabetes mellitus without complications: Secondary | ICD-10-CM | POA: Diagnosis not present

## 2016-10-08 DIAGNOSIS — I10 Essential (primary) hypertension: Secondary | ICD-10-CM | POA: Diagnosis not present

## 2016-10-08 DIAGNOSIS — E785 Hyperlipidemia, unspecified: Secondary | ICD-10-CM | POA: Diagnosis not present

## 2016-10-08 DIAGNOSIS — I4891 Unspecified atrial fibrillation: Secondary | ICD-10-CM | POA: Diagnosis not present

## 2016-10-08 DIAGNOSIS — I2510 Atherosclerotic heart disease of native coronary artery without angina pectoris: Secondary | ICD-10-CM | POA: Diagnosis not present

## 2016-10-08 DIAGNOSIS — N401 Enlarged prostate with lower urinary tract symptoms: Secondary | ICD-10-CM | POA: Diagnosis not present

## 2016-10-08 DIAGNOSIS — K210 Gastro-esophageal reflux disease with esophagitis: Secondary | ICD-10-CM | POA: Diagnosis not present

## 2016-10-08 DIAGNOSIS — M109 Gout, unspecified: Secondary | ICD-10-CM | POA: Diagnosis not present

## 2016-10-08 DIAGNOSIS — N184 Chronic kidney disease, stage 4 (severe): Secondary | ICD-10-CM | POA: Diagnosis not present

## 2016-10-08 DIAGNOSIS — D649 Anemia, unspecified: Secondary | ICD-10-CM | POA: Diagnosis not present

## 2016-10-08 DIAGNOSIS — J449 Chronic obstructive pulmonary disease, unspecified: Secondary | ICD-10-CM | POA: Diagnosis not present

## 2016-10-08 DIAGNOSIS — I509 Heart failure, unspecified: Secondary | ICD-10-CM | POA: Diagnosis not present

## 2016-10-09 DIAGNOSIS — J449 Chronic obstructive pulmonary disease, unspecified: Secondary | ICD-10-CM | POA: Diagnosis not present

## 2016-10-13 DIAGNOSIS — D631 Anemia in chronic kidney disease: Secondary | ICD-10-CM | POA: Diagnosis not present

## 2016-10-13 DIAGNOSIS — N184 Chronic kidney disease, stage 4 (severe): Secondary | ICD-10-CM | POA: Diagnosis not present

## 2016-10-24 DIAGNOSIS — J449 Chronic obstructive pulmonary disease, unspecified: Secondary | ICD-10-CM | POA: Diagnosis not present

## 2016-10-26 DIAGNOSIS — I5032 Chronic diastolic (congestive) heart failure: Secondary | ICD-10-CM | POA: Diagnosis not present

## 2016-10-26 DIAGNOSIS — I482 Chronic atrial fibrillation: Secondary | ICD-10-CM | POA: Diagnosis not present

## 2016-10-26 DIAGNOSIS — I110 Hypertensive heart disease with heart failure: Secondary | ICD-10-CM | POA: Diagnosis not present

## 2016-10-26 DIAGNOSIS — N184 Chronic kidney disease, stage 4 (severe): Secondary | ICD-10-CM | POA: Diagnosis not present

## 2016-10-26 DIAGNOSIS — I25119 Atherosclerotic heart disease of native coronary artery with unspecified angina pectoris: Secondary | ICD-10-CM | POA: Diagnosis not present

## 2016-10-26 DIAGNOSIS — Z7901 Long term (current) use of anticoagulants: Secondary | ICD-10-CM | POA: Diagnosis not present

## 2016-10-27 DIAGNOSIS — D638 Anemia in other chronic diseases classified elsewhere: Secondary | ICD-10-CM | POA: Diagnosis not present

## 2016-10-27 DIAGNOSIS — N184 Chronic kidney disease, stage 4 (severe): Secondary | ICD-10-CM | POA: Diagnosis not present

## 2016-11-03 DIAGNOSIS — D631 Anemia in chronic kidney disease: Secondary | ICD-10-CM | POA: Diagnosis not present

## 2016-11-03 DIAGNOSIS — N184 Chronic kidney disease, stage 4 (severe): Secondary | ICD-10-CM | POA: Diagnosis not present

## 2016-11-09 DIAGNOSIS — Z6825 Body mass index (BMI) 25.0-25.9, adult: Secondary | ICD-10-CM | POA: Diagnosis not present

## 2016-11-09 DIAGNOSIS — G2581 Restless legs syndrome: Secondary | ICD-10-CM | POA: Diagnosis not present

## 2016-11-09 DIAGNOSIS — E538 Deficiency of other specified B group vitamins: Secondary | ICD-10-CM | POA: Diagnosis not present

## 2016-11-09 DIAGNOSIS — E785 Hyperlipidemia, unspecified: Secondary | ICD-10-CM | POA: Diagnosis not present

## 2016-11-09 DIAGNOSIS — I2510 Atherosclerotic heart disease of native coronary artery without angina pectoris: Secondary | ICD-10-CM | POA: Diagnosis not present

## 2016-11-09 DIAGNOSIS — N184 Chronic kidney disease, stage 4 (severe): Secondary | ICD-10-CM | POA: Diagnosis not present

## 2016-11-09 DIAGNOSIS — J449 Chronic obstructive pulmonary disease, unspecified: Secondary | ICD-10-CM | POA: Diagnosis not present

## 2016-11-09 DIAGNOSIS — E119 Type 2 diabetes mellitus without complications: Secondary | ICD-10-CM | POA: Diagnosis not present

## 2016-11-09 DIAGNOSIS — I509 Heart failure, unspecified: Secondary | ICD-10-CM | POA: Diagnosis not present

## 2016-11-09 DIAGNOSIS — M109 Gout, unspecified: Secondary | ICD-10-CM | POA: Diagnosis not present

## 2016-11-09 DIAGNOSIS — K210 Gastro-esophageal reflux disease with esophagitis: Secondary | ICD-10-CM | POA: Diagnosis not present

## 2016-11-09 DIAGNOSIS — N401 Enlarged prostate with lower urinary tract symptoms: Secondary | ICD-10-CM | POA: Diagnosis not present

## 2016-11-09 DIAGNOSIS — D649 Anemia, unspecified: Secondary | ICD-10-CM | POA: Diagnosis not present

## 2016-11-17 DIAGNOSIS — D631 Anemia in chronic kidney disease: Secondary | ICD-10-CM | POA: Diagnosis not present

## 2016-11-17 DIAGNOSIS — N184 Chronic kidney disease, stage 4 (severe): Secondary | ICD-10-CM | POA: Diagnosis not present

## 2016-11-21 DIAGNOSIS — J449 Chronic obstructive pulmonary disease, unspecified: Secondary | ICD-10-CM | POA: Diagnosis not present

## 2016-11-30 DIAGNOSIS — N184 Chronic kidney disease, stage 4 (severe): Secondary | ICD-10-CM | POA: Diagnosis not present

## 2016-11-30 DIAGNOSIS — D631 Anemia in chronic kidney disease: Secondary | ICD-10-CM | POA: Diagnosis not present

## 2016-12-01 DIAGNOSIS — D631 Anemia in chronic kidney disease: Secondary | ICD-10-CM | POA: Diagnosis not present

## 2016-12-01 DIAGNOSIS — I10 Essential (primary) hypertension: Secondary | ICD-10-CM | POA: Diagnosis not present

## 2016-12-01 DIAGNOSIS — N2581 Secondary hyperparathyroidism of renal origin: Secondary | ICD-10-CM | POA: Diagnosis not present

## 2016-12-01 DIAGNOSIS — E669 Obesity, unspecified: Secondary | ICD-10-CM | POA: Diagnosis not present

## 2016-12-01 DIAGNOSIS — N185 Chronic kidney disease, stage 5: Secondary | ICD-10-CM | POA: Diagnosis not present

## 2016-12-07 DIAGNOSIS — J449 Chronic obstructive pulmonary disease, unspecified: Secondary | ICD-10-CM | POA: Diagnosis not present

## 2016-12-09 DIAGNOSIS — N186 End stage renal disease: Secondary | ICD-10-CM | POA: Diagnosis not present

## 2016-12-09 DIAGNOSIS — I871 Compression of vein: Secondary | ICD-10-CM | POA: Diagnosis not present

## 2016-12-09 DIAGNOSIS — T82858A Stenosis of vascular prosthetic devices, implants and grafts, initial encounter: Secondary | ICD-10-CM | POA: Diagnosis not present

## 2016-12-09 DIAGNOSIS — Z992 Dependence on renal dialysis: Secondary | ICD-10-CM | POA: Diagnosis not present

## 2016-12-10 DIAGNOSIS — E538 Deficiency of other specified B group vitamins: Secondary | ICD-10-CM | POA: Diagnosis not present

## 2016-12-10 DIAGNOSIS — E785 Hyperlipidemia, unspecified: Secondary | ICD-10-CM | POA: Diagnosis not present

## 2016-12-10 DIAGNOSIS — K210 Gastro-esophageal reflux disease with esophagitis: Secondary | ICD-10-CM | POA: Diagnosis not present

## 2016-12-10 DIAGNOSIS — G2581 Restless legs syndrome: Secondary | ICD-10-CM | POA: Diagnosis not present

## 2016-12-10 DIAGNOSIS — M109 Gout, unspecified: Secondary | ICD-10-CM | POA: Diagnosis not present

## 2016-12-10 DIAGNOSIS — D649 Anemia, unspecified: Secondary | ICD-10-CM | POA: Diagnosis not present

## 2016-12-10 DIAGNOSIS — N184 Chronic kidney disease, stage 4 (severe): Secondary | ICD-10-CM | POA: Diagnosis not present

## 2016-12-10 DIAGNOSIS — E119 Type 2 diabetes mellitus without complications: Secondary | ICD-10-CM | POA: Diagnosis not present

## 2016-12-10 DIAGNOSIS — N401 Enlarged prostate with lower urinary tract symptoms: Secondary | ICD-10-CM | POA: Diagnosis not present

## 2016-12-10 DIAGNOSIS — I509 Heart failure, unspecified: Secondary | ICD-10-CM | POA: Diagnosis not present

## 2016-12-10 DIAGNOSIS — I2510 Atherosclerotic heart disease of native coronary artery without angina pectoris: Secondary | ICD-10-CM | POA: Diagnosis not present

## 2016-12-10 DIAGNOSIS — I4891 Unspecified atrial fibrillation: Secondary | ICD-10-CM | POA: Diagnosis not present

## 2016-12-15 DIAGNOSIS — D631 Anemia in chronic kidney disease: Secondary | ICD-10-CM | POA: Diagnosis not present

## 2016-12-15 DIAGNOSIS — N184 Chronic kidney disease, stage 4 (severe): Secondary | ICD-10-CM | POA: Diagnosis not present

## 2016-12-22 DIAGNOSIS — J449 Chronic obstructive pulmonary disease, unspecified: Secondary | ICD-10-CM | POA: Diagnosis not present

## 2016-12-22 DIAGNOSIS — D631 Anemia in chronic kidney disease: Secondary | ICD-10-CM | POA: Diagnosis not present

## 2016-12-22 DIAGNOSIS — N184 Chronic kidney disease, stage 4 (severe): Secondary | ICD-10-CM | POA: Diagnosis not present

## 2017-01-05 ENCOUNTER — Encounter: Payer: Self-pay | Admitting: Vascular Surgery

## 2017-01-05 DIAGNOSIS — N184 Chronic kidney disease, stage 4 (severe): Secondary | ICD-10-CM | POA: Diagnosis not present

## 2017-01-05 DIAGNOSIS — D631 Anemia in chronic kidney disease: Secondary | ICD-10-CM | POA: Diagnosis not present

## 2017-01-07 DIAGNOSIS — J449 Chronic obstructive pulmonary disease, unspecified: Secondary | ICD-10-CM | POA: Diagnosis not present

## 2017-01-12 ENCOUNTER — Encounter: Payer: Self-pay | Admitting: Vascular Surgery

## 2017-01-12 ENCOUNTER — Ambulatory Visit (INDEPENDENT_AMBULATORY_CARE_PROVIDER_SITE_OTHER): Payer: PPO | Admitting: Vascular Surgery

## 2017-01-12 VITALS — BP 149/68 | HR 65 | Temp 97.4°F | Resp 20 | Ht 68.0 in | Wt 185.0 lb

## 2017-01-12 DIAGNOSIS — N184 Chronic kidney disease, stage 4 (severe): Secondary | ICD-10-CM

## 2017-01-12 NOTE — Progress Notes (Signed)
Vascular and Vein Specialist of [place]  Patient name: Kyle [name] MRN: [id number] DOB: 01/27/1941 Sex: male  REASON FOR VISIT: Discuss options for hemodialysis  HPI: Kyle [name] is a 76 y.o. male known to me from prior right upper arm brachiocephalic fistula creation on 07/13/2016. He reports that his renal function has remained stable. When I last seen him he did have a patent fistula and appeared as he was having very nice early maturation. He is subsequently occluded the upper portion of his fistula. He did undergo fistulogram at [facility] on 12/09/2016 showing outflow occlusion. He is seen today for discussion of new access. He is right-handed. He had a prior left radial harvest for coronary bypass. He does report some numbness and tingling in his entire left arm on occasion. This does appear to be positional and is not related to activity. Does not appear to be left arm ischemia.  Past Medical History:  Diagnosis Date  . Anemia   . Arthritis    "I'm eat up w/it" (07/08/2016)  . Atrial fibrillation ([place])   . BPH (benign prostatic hyperplasia)   . CAD (coronary artery disease)   . Chronic systolic (congestive) heart failure ([place])   . CKD (chronic kidney disease), stage IV ([place])   . Depression with anxiety   . 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 Essential hypertension   . GERD (gastroesophageal reflux disease)   . GERD (gastroesophageal reflux disease)   . Gout   . HLD (hyperlipidemia)   . Myocardial infarction ([place])    "I've had several light heart attacks over this past year; might have had one this time" (07/08/2016)  . On home oxygen therapy    "2L prn" (07/08/2016)  . Pneumonia   . PONV (postoperative nausea and vomiting)   . Restless leg syndrome   . Shortness of breath dyspnea     Family History  Problem Relation Age of Onset  . Heart disease Mother   . Heart disease  Father   . Lung cancer Brother   . Diabetes Sister     SOCIAL HISTORY: Social History  Substance Use Topics  . Smoking status: Former Smoker    Packs/day: 2.00    Years: 12.00    Types: Cigarettes    Quit date: 51  . Smokeless tobacco: Never Used  . Alcohol use 0.0 oz/week     Comment: 07/08/2016 nothing since 1970"    Allergies  Allergen Reactions  . Statins Other (See Comments)    Pt has tried 4-5 diff kinds. 03/20/2016 Pt reports no reaction or allergy, but has tried different types of statins to figure out which works best for him.     Current Outpatient Prescriptions  Medication Sig Dispense Refill  . allopurinol (ZYLOPRIM) 100 MG tablet Take 100 mg by mouth daily.    Marland Kitchen apixaban (ELIQUIS) 2.5 MG TABS tablet Take 1 tablet (2.5 mg total) by mouth 2 (two) times daily. 60 tablet 0  . aspirin EC 81 MG tablet Take 81 mg by mouth daily.    . Calcium Carb-Cholecalciferol (CALTRATE 600+D3 SOFT PO) Take 1 tablet by mouth daily.    . carvedilol (COREG) 25 MG tablet Take 25 mg by mouth 2 (two) times daily with a meal.    . cloNIDine (CATAPRES - DOSED IN MG/24 HR) 0.2 mg/24hr patch Place 1 patch (0.2 mg total) onto the skin once a week. 3 patch  0  . clopidogrel (PLAVIX) 75 MG tablet Take 75 mg by mouth daily.    . cyanocobalamin (,VITAMIN B-12,) 1000 MCG/ML injection Inject 1,000 mcg into the muscle every 30 (thirty) days.    . ferrous sulfate 325 (65 FE) MG tablet Take 325 mg by mouth daily with breakfast.    . fexofenadine (ALLEGRA) 180 MG tablet Take 180 mg by mouth daily.    . hydrALAZINE (APRESOLINE) 50 MG tablet Take 50 mg by mouth 3 (three) times daily.    . insulin aspart (NOVOLOG) 100 UNIT/ML injection Inject 15-30 Units into the skin 3 (three) times daily as needed for high blood sugar. Per sliding scale     . insulin NPH Human (NOVOLIN N) 100 UNIT/ML injection Inject 30 Units into the skin 2 (two) times daily.     . Mupirocin (BACTROBAN EX) Apply 1 application topically  daily as needed (irritation).    . nitroGLYCERIN (NITROSTAT) 0.4 MG SL tablet Place 0.4 mg under the tongue every 5 (five) minutes as needed for chest pain.    Marland Kitchen omeprazole (PRILOSEC) 20 MG capsule Take 20 mg by mouth daily.    . ondansetron (ZOFRAN-ODT) 4 MG disintegrating tablet Take 4 mg by mouth every 4 (four) hours as needed for nausea or vomiting.    Marland Kitchen oxyCODONE (OXY IR/ROXICODONE) 5 MG immediate release tablet Take 1 tablet (5 mg total) by mouth every 6 (six) hours as needed for moderate pain or severe pain. 6 tablet 0  . Probiotic Product (PROBIOTIC MULTI-ENZYME PO) Take 1 tablet by mouth daily.    . ranolazine (RANEXA) 500 MG 12 hr tablet Take 1 tablet (500 mg total) by mouth 2 (two) times daily. 60 tablet 0  . rOPINIRole (REQUIP) 1 MG tablet Take 1 mg by mouth at bedtime as needed (RLS).    Marland Kitchen rosuvastatin (CRESTOR) 20 MG tablet Take 20 mg by mouth daily.    . tamsulosin (FLOMAX) 0.4 MG CAPS capsule Take 0.8 mg by mouth at bedtime.     . torsemide (DEMADEX) 100 MG tablet Take 1 tablet (100 mg total) by mouth daily. 30 tablet 0   No current facility-administered medications for this visit.     REVIEW OF SYSTEMS:  [X]  denotes positive finding, [ ]  denotes negative finding Cardiac  Comments:  Chest pain or chest pressure:    Shortness of breath upon exertion:    Short of breath when lying flat:    Irregular heart rhythm:        Vascular    Pain in calf, thigh, or hip brought on by ambulation:    Pain in feet at night that wakes you up from your sleep:     Blood clot in your veins:    Leg swelling:           PHYSICAL EXAM: Vitals:   01/12/17 0919  BP: (!) 149/68  Pulse: 65  Resp: 20  Temp: 97.4 F (36.3 C)  TempSrc: Oral  SpO2: 98%  Weight: 185 lb (83.9 kg)  Height: 5\' 8"  (1.727 m)    GENERAL: The patient is a well-nourished male, in no acute distress. The vital signs are documented above. CARDIOVASCULAR: Sent left radial pulse with the scar from radial artery  harvest. On the right he has large size of his antecubital cephalic vein fistula. This does have a more pulsatile nature than a thrill  PULMONARY: There is good air exchange  MUSCULOSKELETAL: There are no major deformities or cyanosis. NEUROLOGIC: No focal weakness  or paresthesias are detected. SKIN: There are no ulcers or rashes noted. PSYCHIATRIC: The patient has a normal affect.  DATA:  I reviewed his fistulogram from [facility]. Also image his veins with SonoSite ultrasound. He does have a nice caliber basilic vein in his right upper arm. I do feel he would be a candidate for basilic vein fistula.   Medical issues:  I discussed this at length with the patient his wife and daughter present. I explained that this would be either a 1 stage transposition or could be 2 stages with initial fistula creation and subsequent transposition and that we would make this decision based on findings at time of surgery. The patient reports that they have an appointment with Dr. Posey Pronto later this month and would like to defer scheduling until they have had a chance to talk with him to determine if this can be delayed or whether this time is appropriate for fistula creation. We can schedule right arm basilic vein fistula if felt to be appropriate from a timing standpoint. Will await further communication from Dr. Chip Boer, MD [facility] Vascular and Vein Specialists of [facility] [phone] Pager [phone]

## 2017-01-14 DIAGNOSIS — I10 Essential (primary) hypertension: Secondary | ICD-10-CM | POA: Diagnosis not present

## 2017-01-14 DIAGNOSIS — E785 Hyperlipidemia, unspecified: Secondary | ICD-10-CM | POA: Diagnosis not present

## 2017-01-14 DIAGNOSIS — G2581 Restless legs syndrome: Secondary | ICD-10-CM | POA: Diagnosis not present

## 2017-01-14 DIAGNOSIS — M109 Gout, unspecified: Secondary | ICD-10-CM | POA: Diagnosis not present

## 2017-01-14 DIAGNOSIS — D649 Anemia, unspecified: Secondary | ICD-10-CM | POA: Diagnosis not present

## 2017-01-14 DIAGNOSIS — N184 Chronic kidney disease, stage 4 (severe): Secondary | ICD-10-CM | POA: Diagnosis not present

## 2017-01-14 DIAGNOSIS — I4891 Unspecified atrial fibrillation: Secondary | ICD-10-CM | POA: Diagnosis not present

## 2017-01-14 DIAGNOSIS — E119 Type 2 diabetes mellitus without complications: Secondary | ICD-10-CM | POA: Diagnosis not present

## 2017-01-14 DIAGNOSIS — I509 Heart failure, unspecified: Secondary | ICD-10-CM | POA: Diagnosis not present

## 2017-01-14 DIAGNOSIS — I2510 Atherosclerotic heart disease of native coronary artery without angina pectoris: Secondary | ICD-10-CM | POA: Diagnosis not present

## 2017-01-14 DIAGNOSIS — K210 Gastro-esophageal reflux disease with esophagitis: Secondary | ICD-10-CM | POA: Diagnosis not present

## 2017-01-14 DIAGNOSIS — N401 Enlarged prostate with lower urinary tract symptoms: Secondary | ICD-10-CM | POA: Diagnosis not present

## 2017-01-19 DIAGNOSIS — D631 Anemia in chronic kidney disease: Secondary | ICD-10-CM | POA: Diagnosis not present

## 2017-01-19 DIAGNOSIS — N184 Chronic kidney disease, stage 4 (severe): Secondary | ICD-10-CM | POA: Diagnosis not present

## 2017-01-21 DIAGNOSIS — J449 Chronic obstructive pulmonary disease, unspecified: Secondary | ICD-10-CM | POA: Diagnosis not present

## 2017-02-02 ENCOUNTER — Other Ambulatory Visit: Payer: Self-pay | Admitting: *Deleted

## 2017-02-02 DIAGNOSIS — I10 Essential (primary) hypertension: Secondary | ICD-10-CM | POA: Diagnosis not present

## 2017-02-02 DIAGNOSIS — D631 Anemia in chronic kidney disease: Secondary | ICD-10-CM | POA: Diagnosis not present

## 2017-02-02 DIAGNOSIS — N2581 Secondary hyperparathyroidism of renal origin: Secondary | ICD-10-CM | POA: Diagnosis not present

## 2017-02-02 DIAGNOSIS — N185 Chronic kidney disease, stage 5: Secondary | ICD-10-CM | POA: Diagnosis not present

## 2017-02-02 DIAGNOSIS — E669 Obesity, unspecified: Secondary | ICD-10-CM | POA: Diagnosis not present

## 2017-02-04 DIAGNOSIS — D631 Anemia in chronic kidney disease: Secondary | ICD-10-CM | POA: Diagnosis not present

## 2017-02-04 DIAGNOSIS — N184 Chronic kidney disease, stage 4 (severe): Secondary | ICD-10-CM | POA: Diagnosis not present

## 2017-02-06 DIAGNOSIS — J449 Chronic obstructive pulmonary disease, unspecified: Secondary | ICD-10-CM | POA: Diagnosis not present

## 2017-02-15 DIAGNOSIS — E1165 Type 2 diabetes mellitus with hyperglycemia: Secondary | ICD-10-CM | POA: Diagnosis not present

## 2017-02-15 DIAGNOSIS — E785 Hyperlipidemia, unspecified: Secondary | ICD-10-CM | POA: Diagnosis not present

## 2017-02-15 DIAGNOSIS — N184 Chronic kidney disease, stage 4 (severe): Secondary | ICD-10-CM | POA: Diagnosis not present

## 2017-02-15 DIAGNOSIS — Z6825 Body mass index (BMI) 25.0-25.9, adult: Secondary | ICD-10-CM | POA: Diagnosis not present

## 2017-02-16 ENCOUNTER — Other Ambulatory Visit: Payer: Self-pay

## 2017-02-16 ENCOUNTER — Encounter (HOSPITAL_COMMUNITY): Payer: Self-pay | Admitting: *Deleted

## 2017-02-16 NOTE — Progress Notes (Signed)
Pt SDW-Pre-op call completed by both pt and pt spouse Pamala Hurry with pt verbal consent. Spouse denies any acute cardiopulmonary issues. Spouse stated that pt had a stress test > 10 years ago. Spouse made aware to have pt stop taking vitamins, fish oil and herbal medications. Do not take any NSAIDs ie: Ibuprofen, Advil, Naproxen, BC and Goody Powder. Spouse stated that pt last dose of Eliquis was 02/11/17 as instructed. Spouse stated that MD advised that pt take half dose of Novolin N at night and no insulin DOS. Pt made aware to check BG every 2 hours prior to arrival to hospital on DOS. Pt made aware to treat a BG < 70 with or 4 ounces of apple  juice, wait 15 minutes after intervention to recheck BG, if BG remains < 70, call Short Stay unit to speak with a nurse.Souse verbalized understanding of all pre-op instructions.

## 2017-02-17 ENCOUNTER — Ambulatory Visit (HOSPITAL_COMMUNITY): Payer: PPO | Admitting: Anesthesiology

## 2017-02-17 ENCOUNTER — Encounter (HOSPITAL_COMMUNITY): Payer: Self-pay | Admitting: *Deleted

## 2017-02-17 ENCOUNTER — Ambulatory Visit (HOSPITAL_COMMUNITY)
Admission: RE | Admit: 2017-02-17 | Discharge: 2017-02-17 | Disposition: A | Discharge status: Home / Self Care | Payer: PPO | Source: Ambulatory Visit | Attending: Vascular Surgery | Admitting: Vascular Surgery

## 2017-02-17 ENCOUNTER — Encounter (HOSPITAL_COMMUNITY)
Admission: RE | Disposition: A | Discharge status: Home / Self Care | Payer: Self-pay | Source: Ambulatory Visit | Attending: Vascular Surgery

## 2017-02-17 DIAGNOSIS — E1122 Type 2 diabetes mellitus with diabetic chronic kidney disease: Secondary | ICD-10-CM | POA: Diagnosis not present

## 2017-02-17 DIAGNOSIS — R0600 Dyspnea, unspecified: Secondary | ICD-10-CM | POA: Diagnosis not present

## 2017-02-17 DIAGNOSIS — I5022 Chronic systolic (congestive) heart failure: Secondary | ICD-10-CM | POA: Insufficient documentation

## 2017-02-17 DIAGNOSIS — Z794 Long term (current) use of insulin: Secondary | ICD-10-CM | POA: Insufficient documentation

## 2017-02-17 DIAGNOSIS — Z9981 Dependence on supplemental oxygen: Secondary | ICD-10-CM | POA: Insufficient documentation

## 2017-02-17 DIAGNOSIS — I120 Hypertensive chronic kidney disease with stage 5 chronic kidney disease or end stage renal disease: Secondary | ICD-10-CM | POA: Diagnosis not present

## 2017-02-17 DIAGNOSIS — N400 Enlarged prostate without lower urinary tract symptoms: Secondary | ICD-10-CM | POA: Insufficient documentation

## 2017-02-17 DIAGNOSIS — I252 Old myocardial infarction: Secondary | ICD-10-CM | POA: Insufficient documentation

## 2017-02-17 DIAGNOSIS — Z79899 Other long term (current) drug therapy: Secondary | ICD-10-CM | POA: Insufficient documentation

## 2017-02-17 DIAGNOSIS — Z8249 Family history of ischemic heart disease and other diseases of the circulatory system: Secondary | ICD-10-CM | POA: Diagnosis not present

## 2017-02-17 DIAGNOSIS — G2581 Restless legs syndrome: Secondary | ICD-10-CM | POA: Insufficient documentation

## 2017-02-17 DIAGNOSIS — Z7982 Long term (current) use of aspirin: Secondary | ICD-10-CM | POA: Insufficient documentation

## 2017-02-17 DIAGNOSIS — I132 Hypertensive heart and chronic kidney disease with heart failure and with stage 5 chronic kidney disease, or end stage renal disease: Secondary | ICD-10-CM | POA: Insufficient documentation

## 2017-02-17 DIAGNOSIS — I4891 Unspecified atrial fibrillation: Secondary | ICD-10-CM | POA: Diagnosis not present

## 2017-02-17 DIAGNOSIS — K219 Gastro-esophageal reflux disease without esophagitis: Secondary | ICD-10-CM | POA: Insufficient documentation

## 2017-02-17 DIAGNOSIS — I2510 Atherosclerotic heart disease of native coronary artery without angina pectoris: Secondary | ICD-10-CM | POA: Insufficient documentation

## 2017-02-17 DIAGNOSIS — Z992 Dependence on renal dialysis: Secondary | ICD-10-CM | POA: Insufficient documentation

## 2017-02-17 DIAGNOSIS — Z87891 Personal history of nicotine dependence: Secondary | ICD-10-CM | POA: Diagnosis not present

## 2017-02-17 DIAGNOSIS — Z888 Allergy status to other drugs, medicaments and biological substances status: Secondary | ICD-10-CM | POA: Diagnosis not present

## 2017-02-17 DIAGNOSIS — E785 Hyperlipidemia, unspecified: Secondary | ICD-10-CM | POA: Diagnosis not present

## 2017-02-17 DIAGNOSIS — M109 Gout, unspecified: Secondary | ICD-10-CM | POA: Insufficient documentation

## 2017-02-17 DIAGNOSIS — Z833 Family history of diabetes mellitus: Secondary | ICD-10-CM | POA: Insufficient documentation

## 2017-02-17 DIAGNOSIS — M1990 Unspecified osteoarthritis, unspecified site: Secondary | ICD-10-CM | POA: Insufficient documentation

## 2017-02-17 DIAGNOSIS — N186 End stage renal disease: Secondary | ICD-10-CM | POA: Diagnosis not present

## 2017-02-17 DIAGNOSIS — F418 Other specified anxiety disorders: Secondary | ICD-10-CM | POA: Diagnosis not present

## 2017-02-17 DIAGNOSIS — N185 Chronic kidney disease, stage 5: Secondary | ICD-10-CM | POA: Diagnosis not present

## 2017-02-17 DIAGNOSIS — Z801 Family history of malignant neoplasm of trachea, bronchus and lung: Secondary | ICD-10-CM | POA: Insufficient documentation

## 2017-02-17 HISTORY — PX: BASCILIC VEIN TRANSPOSITION: SHX5742

## 2017-02-17 HISTORY — DX: Headache, unspecified: R51.9

## 2017-02-17 HISTORY — DX: Headache: R51

## 2017-02-17 LAB — POCT I-STAT 4, (NA,K, GLUC, HGB,HCT)
Glucose, Bld: 276 mg/dL — ABNORMAL HIGH (ref 65–99)
HCT: 31 % — ABNORMAL LOW (ref 39.0–52.0)
HEMOGLOBIN: 10.5 g/dL — AB (ref 13.0–17.0)
Potassium: 4.8 mmol/L (ref 3.5–5.1)
Sodium: 137 mmol/L (ref 135–145)

## 2017-02-17 LAB — GLUCOSE, CAPILLARY
Glucose-Capillary: 269 mg/dL — ABNORMAL HIGH (ref 65–99)
Glucose-Capillary: 195 mg/dL — ABNORMAL HIGH (ref 65–99)

## 2017-02-17 SURGERY — TRANSPOSITION, VEIN, BASILIC
Anesthesia: Monitor Anesthesia Care | Site: Arm Upper | Laterality: Right

## 2017-02-17 MED ORDER — ONDANSETRON HCL 4 MG/2ML IJ SOLN
INTRAMUSCULAR | Status: DC | PRN
  Administered 2017-02-17: 4 mg via INTRAVENOUS

## 2017-02-17 MED ORDER — FENTANYL CITRATE (PF) 250 MCG/5ML IJ SOLN
INTRAMUSCULAR | Status: AC
  Filled 2017-02-17: qty 5

## 2017-02-17 MED ORDER — PROTAMINE SULFATE 10 MG/ML IV SOLN
INTRAVENOUS | Status: AC
  Filled 2017-02-17: qty 5

## 2017-02-17 MED ORDER — HEPARIN SODIUM (PORCINE) 1000 UNIT/ML IJ SOLN
INTRAMUSCULAR | Status: AC
  Filled 2017-02-17: qty 1

## 2017-02-17 MED ORDER — FENTANYL CITRATE (PF) 100 MCG/2ML IJ SOLN: 25.0000 ug | INTRAMUSCULAR | Status: DC | PRN

## 2017-02-17 MED ORDER — LIDOCAINE-EPINEPHRINE (PF) 1 %-1:200000 IJ SOLN
INTRAMUSCULAR | Status: AC
  Filled 2017-02-17: qty 30

## 2017-02-17 MED ORDER — INSULIN ASPART 100 UNIT/ML ~~LOC~~ SOLN
6.0000 [IU] | Freq: Once | SUBCUTANEOUS | Status: AC
  Administered 2017-02-17: 6 [IU] via SUBCUTANEOUS

## 2017-02-17 MED ORDER — HEPARIN SODIUM (PORCINE) 5000 UNIT/ML IJ SOLN
INTRAMUSCULAR | Status: DC | PRN
  Administered 2017-02-17: 12:00:00 500 mL

## 2017-02-17 MED ORDER — CHLORHEXIDINE GLUCONATE 4 % EX LIQD: 60.0000 mL | Freq: Once | CUTANEOUS | Status: DC

## 2017-02-17 MED ORDER — LIDOCAINE-EPINEPHRINE (PF) 1 %-1:200000 IJ SOLN
INTRAMUSCULAR | Status: DC | PRN
  Administered 2017-02-17: 21 mL

## 2017-02-17 MED ORDER — PHENYLEPHRINE HCL 10 MG/ML IJ SOLN
INTRAMUSCULAR | Status: DC | PRN
Start: 2017-02-17 — End: 2017-02-17
  Administered 2017-02-17: 80 ug via INTRAVENOUS

## 2017-02-17 MED ORDER — STERILE WATER FOR IRRIGATION IR SOLN
Status: DC | PRN
  Administered 2017-02-17: 1000 mL

## 2017-02-17 MED ORDER — SODIUM CHLORIDE 0.9 % IV SOLN
INTRAVENOUS | Status: DC
  Administered 2017-02-17: 11:00:00 via INTRAVENOUS

## 2017-02-17 MED ORDER — PROPOFOL 10 MG/ML IV BOLUS
INTRAVENOUS | Status: AC
  Filled 2017-02-17: qty 20

## 2017-02-17 MED ORDER — 0.9 % SODIUM CHLORIDE (POUR BTL) OPTIME
TOPICAL | Status: DC | PRN
  Administered 2017-02-17: 1000 mL

## 2017-02-17 MED ORDER — DEXTROSE 5 % IV SOLN
1.5000 g | INTRAVENOUS | Status: AC
  Administered 2017-02-17: 1.5 g via INTRAVENOUS
  Filled 2017-02-17: qty 1.5

## 2017-02-17 MED ORDER — PROPOFOL 10 MG/ML IV BOLUS
INTRAVENOUS | Status: DC | PRN
  Administered 2017-02-17: 20 mg via INTRAVENOUS

## 2017-02-17 MED ORDER — ONDANSETRON HCL 4 MG/2ML IJ SOLN
INTRAMUSCULAR | Status: AC
  Filled 2017-02-17: qty 2

## 2017-02-17 MED ORDER — OXYCODONE HCL 10 MG PO TABS: 5.0000 mg | ORAL_TABLET | Freq: Four times a day (QID) | ORAL | 0 refills | Status: DC | PRN

## 2017-02-17 MED ORDER — FENTANYL CITRATE (PF) 100 MCG/2ML IJ SOLN
INTRAMUSCULAR | Status: DC | PRN
  Administered 2017-02-17: 25 ug via INTRAVENOUS
  Administered 2017-02-17: 100 ug via INTRAVENOUS

## 2017-02-17 MED ORDER — PHENYLEPHRINE HCL 10 MG/ML IJ SOLN
INTRAMUSCULAR | Status: DC | PRN
  Administered 2017-02-17: 25 ug/min via INTRAVENOUS

## 2017-02-17 MED ORDER — PROPOFOL 500 MG/50ML IV EMUL
INTRAVENOUS | Status: DC | PRN
  Administered 2017-02-17: 90 ug/kg/min via INTRAVENOUS

## 2017-02-17 SURGICAL SUPPLY — 31 items
ARMBAND PINK RESTRICT EXTREMIT (MISCELLANEOUS) ×3 IMPLANT
CANISTER SUCT 3000ML PPV (MISCELLANEOUS) ×3 IMPLANT
CANNULA VESSEL 3MM 2 BLNT TIP (CANNULA) ×3 IMPLANT
CLIP LIGATING EXTRA MED SLVR (CLIP) ×3 IMPLANT
CLIP LIGATING EXTRA SM BLUE (MISCELLANEOUS) ×3 IMPLANT
COVER PROBE W GEL 5X96 (DRAPES) ×3 IMPLANT
DECANTER SPIKE VIAL GLASS SM (MISCELLANEOUS) ×3 IMPLANT
DERMABOND ADVANCED (GAUZE/BANDAGES/DRESSINGS) ×2
DERMABOND ADVANCED .7 DNX12 (GAUZE/BANDAGES/DRESSINGS) ×1 IMPLANT
ELECT REM PT RETURN 9FT ADLT (ELECTROSURGICAL) ×3
ELECTRODE REM PT RTRN 9FT ADLT (ELECTROSURGICAL) ×1 IMPLANT
GLOVE SS BIOGEL STRL SZ 7.5 (GLOVE) ×1 IMPLANT
GLOVE SUPERSENSE BIOGEL SZ 7.5 (GLOVE) ×2
GOWN STRL REUS W/ TWL LRG LVL3 (GOWN DISPOSABLE) ×3 IMPLANT
GOWN STRL REUS W/TWL LRG LVL3 (GOWN DISPOSABLE) ×6
KIT BASIN OR (CUSTOM PROCEDURE TRAY) ×3 IMPLANT
KIT ROOM TURNOVER OR (KITS) ×3 IMPLANT
NS IRRIG 1000ML POUR BTL (IV SOLUTION) ×3 IMPLANT
PACK CV ACCESS (CUSTOM PROCEDURE TRAY) ×3 IMPLANT
PAD ARMBOARD 7.5X6 YLW CONV (MISCELLANEOUS) ×6 IMPLANT
SUT PROLENE 6 0 CC (SUTURE) ×3 IMPLANT
SUT SILK 2 0 SH (SUTURE) ×3 IMPLANT
SUT SILK 3 0 (SUTURE) ×2
SUT SILK 3-0 18XBRD TIE 12 (SUTURE) ×1 IMPLANT
SUT SILK 4 0 (SUTURE) ×2
SUT SILK 4-0 18XBRD TIE 12 (SUTURE) ×1 IMPLANT
SUT VIC AB 3-0 SH 27 (SUTURE) ×8
SUT VIC AB 3-0 SH 27X BRD (SUTURE) ×4 IMPLANT
SUT VICRYL 4-0 PS2 18IN ABS (SUTURE) ×3 IMPLANT
UNDERPAD 30X30 (UNDERPADS AND DIAPERS) ×3 IMPLANT
WATER STERILE IRR 1000ML POUR (IV SOLUTION) ×3 IMPLANT

## 2017-02-17 NOTE — Transfer of Care (Signed)
Immediate Anesthesia Transfer of Care Note  Patient: Kyle [name]  Procedure(s) Performed: Procedure(s): BASILIC VEIN TRANSPOSITION RIGHT ARM (Right)  Patient Location: PACU  Anesthesia Type:MAC  Level of Consciousness: sedated  Airway & Oxygen Therapy: Patient Spontanous Breathing  Post-op Assessment: Report given to RN, Post -op Vital signs reviewed and stable and Patient moving all extremities  Post vital signs: Reviewed and stable  Last Vitals:  Vitals:   02/17/17 0925  BP: (!) 126/52  Pulse: 60  Resp: 20  Temp: 36.6 C    Last Pain: There were no vitals filed for this visit.    Patients Stated Pain Goal: 1 (09/40/76 8088)  Complications: No apparent anesthesia complications

## 2017-02-17 NOTE — Anesthesia Preprocedure Evaluation (Addendum)
Anesthesia Evaluation  Patient identified by MRN, date of birth, ID band Patient awake    Reviewed: Allergy & Precautions, H&P , NPO status , Patient's Chart, lab work & pertinent test results  History of Anesthesia Complications (+) PONV  Airway Mallampati: II  TM Distance: >3 FB Neck ROM: Full    Dental no notable dental hx. (+) Teeth Intact, Dental Advisory Given   Pulmonary neg pulmonary ROS, former smoker,    Pulmonary exam normal breath sounds clear to auscultation       Cardiovascular hypertension, Pt. on medications and Pt. on home beta blockers + CAD, + Past MI, + Cardiac Stents and +CHF   Rhythm:Regular Rate:Normal     Neuro/Psych  Headaches, negative psych ROS   GI/Hepatic Neg liver ROS, GERD  Medicated and Controlled,  Endo/Other  diabetes, Insulin Dependent  Renal/GU Renal InsufficiencyRenal disease  negative genitourinary   Musculoskeletal  (+) Arthritis , Osteoarthritis,    Abdominal   Peds  Hematology negative hematology ROS (+) anemia ,   Anesthesia Other Findings   Reproductive/Obstetrics negative OB ROS                            Anesthesia Physical Anesthesia Plan  ASA: III  Anesthesia Plan: MAC   Post-op Pain Management:    Induction: Intravenous  PONV Risk Score and Plan: 3 and Ondansetron, Propofol and Treatment may vary due to age  Airway Management Planned: Simple Face Mask  Additional Equipment:   Intra-op Plan:   Post-operative Plan:   Informed Consent: I have reviewed the patients History and Physical, chart, labs and discussed the procedure including the risks, benefits and alternatives for the proposed anesthesia with the patient or authorized representative who has indicated his/her understanding and acceptance.   Dental advisory given  Plan Discussed with: CRNA  Anesthesia Plan Comments:       Anesthesia Quick Evaluation

## 2017-02-17 NOTE — Op Note (Signed)
    OPERATIVE REPORT  DATE OF SURGERY: 02/17/2017  PATIENT: Kyle [name], 76 y.o. male MRN: [id number]  DOB: 1941-02-24  PRE-OPERATIVE DIAGNOSIS: Chronic renal insufficiency  POST-OPERATIVE DIAGNOSIS:  Same  PROCEDURE: Right basilic vein transposition fistula  SURGEON:  Curt Jews, M.D.  PHYSICIAN ASSISTANT: Samantha Rhyne PA-C  ANESTHESIA:  Local with sedation  EBL: Minimal ml  Total I/O In: 240 [P.O.:240] Out: -   BLOOD ADMINISTERED: None  DRAINS: None  SPECIMEN: None  COUNTS CORRECT:  YES  PLAN OF CARE: PACU   PATIENT DISPOSITION:  PACU - hemodynamically stable  PROCEDURE DETAILS: The patient was taken to the operating placed supine position where the area of the right arm from [facility] sterile fashion. The patient had a prior upper arm brachiocephalic fistula. This is thrombosed and there was flow in the area near the antecubital space but the vein was occluded proximally. The anastomosis was exposed. SonoSite ultrasound was used to visualize the location of the basilic vein which is good caliber. The vein was isolated at the antecubital incision to the same incision and trochanter branches were ligated with 3 or 4 silk ties and divided. The vein was adequate caliber to proceed with the basilic vein transposition in one versus 2 stage procedure. 3 additional incisions were made in the medial aspect of the upper arm to expose the basilic vein from the antecubital space to the axilla. Temperature branches were ligated with 3 or 4 silk ties and divided. The solid vein was ligated below the level of antecubital space and was brought out through the incision at the axilla. The vein is been marked while he was in place to reduce the risk of twisting. The vein was gently dilated was of excellent size. A tunnel was created from the level of the antecubital space to the axilla and the vein was brought through the tunnel. The brachial artery was occluded proximal and distal to the old  anastomosis the old anastomosis was taken down in its entirety. The basilic vein was cut to appropriate length and was spatulated and sewn end-to-side to the brachial artery with a running except Prolene suture. Clamps were removed and excellent thrill was noted through the fistula. The wounds irrigated with saline. Hemostasis tablet cautery. Wounds were closed with 3-0 Vicryl in the subcutaneous and subcuticular tissue. Sterile dressing was applied the patient was transferred to the recovery room stable condition   [name], M.D., [facility] 02/17/2017 2:56 PM

## 2017-02-17 NOTE — Anesthesia Postprocedure Evaluation (Signed)
Anesthesia Post Note  Patient: Kyle [name]  Procedure(s) Performed: Procedure(s) (LRB): BASILIC VEIN TRANSPOSITION RIGHT ARM (Right)     Patient location during evaluation: PACU Anesthesia Type: MAC Level of consciousness: awake and alert Pain management: pain level controlled Vital Signs Assessment: post-procedure vital signs reviewed and stable Respiratory status: spontaneous breathing, nonlabored ventilation and respiratory function stable Cardiovascular status: stable and blood pressure returned to baseline Anesthetic complications: no    Last Vitals:  Vitals:   02/17/17 1350 02/17/17 1405  BP: (!) 134/46 (!) 134/50  Pulse: 73 72  Resp: 14 15  Temp:  36.6 C    Last Pain:  Vitals:   02/17/17 1405  PainSc: 0-No pain                 Vibha Ferdig,W. EDMOND

## 2017-02-17 NOTE — H&P (Signed)
Office Visit   01/12/2017 Vascular and Vein Specialists -Judge Stall, Arvilla Meres, MD  Vascular Surgery   Chronic renal insufficiency, stage 4 (severe) ([place])  Dx   Re-evaluation ; Referred by Ocie Doyne., MD  Reason for Visit   Additional Documentation   Vitals:   BP  149/68 (BP Location: Left Wrist, Patient Position: Sitting, Cuff Size: Normal)   Pulse 65   Temp 97.4 F (36.3 C) (Oral)   Resp 20   Ht 5\' 8"  (1.727 m)   Wt 185 lb (83.9 kg)   SpO2 98%   BMI 28.13 kg/m   BSA 2.01 m   Flowsheets:   Infectious Disease Screening,   Healthcare Directives,   Clinical Intake,   Custom Formula Data,   Vital Signs,   MEWS Score,   Anthropometrics     Encounter Info:   Billing Info,   History,   Allergies,   Detailed Report     All Notes   Progress Notes by [name], MD at 01/12/2017 9:15 AM   Author: [name], MD Author Type: Physician Filed: 01/12/2017 12:30 PM  Note Status: Signed Cosign: Cosign Not Required Encounter Date: 01/12/2017  Editor: [name], MD (Physician)                                          Vascular and Vein Specialist of [facility]  Patient name: Kyle [name]            MRN: [id number]        DOB: March 02, 1941          Sex: male  REASON FOR VISIT: Discuss options for hemodialysis  HPI: Kyle [name] is a 76 y.o. male known to me from prior right upper arm brachiocephalic fistula creation on 07/13/2016. He reports that his renal function has remained stable. When I last seen him he did have a patent fistula and appeared as he was having very nice Harkirat Orozco maturation. He is subsequently occluded the upper portion of his fistula. He did undergo fistul[facility] at [facility] on 12/09/2016 showing outflow occlusion. He is seen today for discussion of new access. He is right-handed. He had a prior left radial harvest for coronary bypass. He does report some numbness and tingling in his entire left arm on occasion. This does appear to be positional  and is not related to activity. Does not appear to be left arm ischemia.      Past Medical History:  Diagnosis Date  . Anemia   . Arthritis    "I'm eat up w/it" (07/08/2016)  . Atrial fibrillation ([place])   . BPH (benign prostatic hyperplasia)   . CAD (coronary artery disease)   . Chronic systolic (congestive) heart failure ([place])   . CKD (chronic kidney disease), stage IV ([place])   . Depression with anxiety   . 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 Essential hypertension   . GERD (gastroesophageal reflux disease)   . GERD (gastroesophageal reflux disease)   . Gout   . HLD (hyperlipidemia)   . Myocardial infarction ([place])    "I've had several light heart attacks over this past year; might have had one this time" (07/08/2016)  . On home oxygen therapy    "2L prn" (07/08/2016)  . Pneumonia   . PONV (postoperative  nausea and vomiting)   . Restless leg syndrome   . Shortness of breath dyspnea          Family History  Problem Relation Age of Onset  . Heart disease Mother   . Heart disease Father   . Lung cancer Brother   . Diabetes Sister     SOCIAL HISTORY:        Social History  Substance Use Topics  . Smoking status: Former Smoker    Packs/day: 2.00    Years: 12.00    Types: Cigarettes    Quit date: 59  . Smokeless tobacco: Never Used  . Alcohol use 0.0 oz/week      Comment: 07/08/2016 nothing since 1970"         Allergies  Allergen Reactions  . Statins Other (See Comments)    Pt has tried 4-5 diff kinds. 03/20/2016 Pt reports no reaction or allergy, but has tried different types of statins to figure out which works best for him.           Current Outpatient Prescriptions  Medication Sig Dispense Refill  . allopurinol (ZYLOPRIM) 100 MG tablet Take 100 mg by mouth daily.    Marland Kitchen apixaban (ELIQUIS) 2.5 MG TABS tablet Take 1 tablet (2.5 mg total) by mouth 2 (two)  times daily. 60 tablet 0  . aspirin EC 81 MG tablet Take 81 mg by mouth daily.    . Calcium Carb-Cholecalciferol (CALTRATE 600+D3 SOFT PO) Take 1 tablet by mouth daily.    . carvedilol (COREG) 25 MG tablet Take 25 mg by mouth 2 (two) times daily with a meal.    . cloNIDine (CATAPRES - DOSED IN MG/24 HR) 0.2 mg/24hr patch Place 1 patch (0.2 mg total) onto the skin once a week. 3 patch 0  . clopidogrel (PLAVIX) 75 MG tablet Take 75 mg by mouth daily.    . cyanocobalamin (,VITAMIN B-12,) 1000 MCG/ML injection Inject 1,000 mcg into the muscle every 30 (thirty) days.    . ferrous sulfate 325 (65 FE) MG tablet Take 325 mg by mouth daily with breakfast.    . fexofenadine (ALLEGRA) 180 MG tablet Take 180 mg by mouth daily.    . hydrALAZINE (APRESOLINE) 50 MG tablet Take 50 mg by mouth 3 (three) times daily.    . insulin aspart (NOVOLOG) 100 UNIT/ML injection Inject 15-30 Units into the skin 3 (three) times daily as needed for high blood sugar. Per sliding scale     . insulin NPH Human (NOVOLIN N) 100 UNIT/ML injection Inject 30 Units into the skin 2 (two) times daily.     . Mupirocin (BACTROBAN EX) Apply 1 application topically daily as needed (irritation).    . nitroGLYCERIN (NITROSTAT) 0.4 MG SL tablet Place 0.4 mg under the tongue every 5 (five) minutes as needed for chest pain.    Marland Kitchen omeprazole (PRILOSEC) 20 MG capsule Take 20 mg by mouth daily.    . ondansetron (ZOFRAN-ODT) 4 MG disintegrating tablet Take 4 mg by mouth every 4 (four) hours as needed for nausea or vomiting.    Marland Kitchen oxyCODONE (OXY IR/ROXICODONE) 5 MG immediate release tablet Take 1 tablet (5 mg total) by mouth every 6 (six) hours as needed for moderate pain or severe pain. 6 tablet 0  . Probiotic Product (PROBIOTIC MULTI-ENZYME PO) Take 1 tablet by mouth daily.    . ranolazine (RANEXA) 500 MG 12 hr tablet Take 1 tablet (500 mg total) by mouth 2 (two) times daily. 60 tablet 0  .  rOPINIRole (REQUIP) 1 MG tablet  Take 1 mg by mouth at bedtime as needed (RLS).    Marland Kitchen rosuvastatin (CRESTOR) 20 MG tablet Take 20 mg by mouth daily.    . tamsulosin (FLOMAX) 0.4 MG CAPS capsule Take 0.8 mg by mouth at bedtime.     . torsemide (DEMADEX) 100 MG tablet Take 1 tablet (100 mg total) by mouth daily. 30 tablet 0   No current facility-administered medications for this visit.     REVIEW OF SYSTEMS:  [X]  denotes positive finding, [ ]  denotes negative finding Cardiac  Comments:  Chest pain or chest pressure:    Shortness of breath upon exertion:    Short of breath when lying flat:    Irregular heart rhythm:        Vascular    Pain in calf, thigh, or hip brought on by ambulation:    Pain in feet at night that wakes you up from your sleep:     Blood clot in your veins:    Leg swelling:           PHYSICAL EXAM:    Vitals:   01/12/17 0919  BP: (!) 149/68  Pulse: 65  Resp: 20  Temp: 97.4 F (36.3 C)  TempSrc: Oral  SpO2: 98%  Weight: 185 lb (83.9 kg)  Height: 5\' 8"  (1.727 m)    GENERAL: The patient is a well-nourished male, in no acute distress. The vital signs are documented above. CARDIOVASCULAR: Sent left radial pulse with the scar from radial artery harvest. On the right he has large size of his antecubital cephalic vein fistula. This does have a more pulsatile nature than a thrill  PULMONARY: There is good air exchange  MUSCULOSKELETAL: There are no major deformities or cyanosis. NEUROLOGIC: No focal weakness or paresthesias are detected. SKIN: There are no ulcers or rashes noted. PSYCHIATRIC: The patient has a normal affect.  DATA:  I reviewed his fistulogram from [facility]. Also image his veins with SonoSite ultrasound. He does have a nice caliber basilic vein in his right upper arm. I do feel he would be a candidate for basilic vein fistula.   Medical issues:  I discussed this at length with the patient his wife and daughter present. I explained  that this would be either a 1 stage transposition or could be 2 stages with initial fistula creation and subsequent transposition and that we would make this decision based on findings at time of surgery. The patient reports that they have an appointment with Dr. Posey Pronto later this month and would like to defer scheduling until they have had a chance to talk with him to determine if this can be delayed or whether this time is appropriate for fistula creation. We can schedule right arm basilic vein fistula if felt to be appropriate from a timing standpoint. Will await further communication from Dr. Chip Boer, MD [facility] Vascular and Vein Specialists of [facility] [phone] Pager [phone]      Addendum:  The patient has been re-examined and re-evaluated.  The patient's history and physical has been reviewed and is unchanged.    RADEN BYINGTON is a 76 y.o. male is being admitted with Chronic kidney disease stage 5. All the risks, benefits and other treatment options have been discussed with the patient. The patient has consented to proceed with Procedure(s): BASILIC VEIN TRANSPOSITION RIGHT ARM as a surgical intervention.  Curt Jews 02/17/2017 10:47 AM  Vascular and Vein Surgery

## 2017-02-17 NOTE — Discharge Instructions (Signed)
° ° °  02/17/2017 Kyle [name] [id number] June 20, 1941  Surgeon(s): Early, Arvilla Meres, MD  Procedure(s): BASILIC VEIN TRANSPOSITION RIGHT ARM  x Do not stick fistula for 12 weeks

## 2017-02-17 NOTE — Anesthesia Procedure Notes (Signed)
Procedure Name: MAC Date/Time: 02/17/2017 11:05 AM Performed by: Trixie Deis A Pre-anesthesia Checklist: Patient identified, Emergency Drugs available, Suction available, Patient being monitored and Timeout performed Oxygen Delivery Method: Simple face mask Placement Confirmation: positive ETCO2

## 2017-02-18 ENCOUNTER — Encounter (HOSPITAL_COMMUNITY): Payer: Self-pay | Admitting: Vascular Surgery

## 2017-02-18 ENCOUNTER — Telehealth: Payer: Self-pay | Admitting: Vascular Surgery

## 2017-02-18 NOTE — Telephone Encounter (Signed)
-----   Message from Mena Goes, RN sent at 02/17/2017  1:43 PM EDT ----- Regarding: 4-6 weeks    ----- Message ----- From: Gabriel Earing, PA-C Sent: 02/17/2017   1:34 PM To: Vvs Charge Pool  S/p right BVT.  F/u with Dr. Donnetta Hutching in 4-6 weeks.  Thanks, Aldona Bar

## 2017-02-18 NOTE — Telephone Encounter (Signed)
Sched appt 03/30/17 at 3:00. Spoke to pt and mailed appt letter as per's pt's request.

## 2017-02-21 DIAGNOSIS — J449 Chronic obstructive pulmonary disease, unspecified: Secondary | ICD-10-CM | POA: Diagnosis not present

## 2017-02-25 DIAGNOSIS — E663 Overweight: Secondary | ICD-10-CM | POA: Diagnosis not present

## 2017-02-25 DIAGNOSIS — Z6826 Body mass index (BMI) 26.0-26.9, adult: Secondary | ICD-10-CM | POA: Diagnosis not present

## 2017-02-25 DIAGNOSIS — J301 Allergic rhinitis due to pollen: Secondary | ICD-10-CM | POA: Diagnosis not present

## 2017-02-25 DIAGNOSIS — R5383 Other fatigue: Secondary | ICD-10-CM | POA: Diagnosis not present

## 2017-02-25 DIAGNOSIS — N184 Chronic kidney disease, stage 4 (severe): Secondary | ICD-10-CM | POA: Diagnosis not present

## 2017-03-02 DIAGNOSIS — N184 Chronic kidney disease, stage 4 (severe): Secondary | ICD-10-CM | POA: Diagnosis not present

## 2017-03-02 DIAGNOSIS — D631 Anemia in chronic kidney disease: Secondary | ICD-10-CM | POA: Diagnosis not present

## 2017-03-09 DIAGNOSIS — J449 Chronic obstructive pulmonary disease, unspecified: Secondary | ICD-10-CM | POA: Diagnosis not present

## 2017-03-19 ENCOUNTER — Encounter: Payer: Self-pay | Admitting: Vascular Surgery

## 2017-03-23 DIAGNOSIS — N184 Chronic kidney disease, stage 4 (severe): Secondary | ICD-10-CM | POA: Diagnosis not present

## 2017-03-23 DIAGNOSIS — J449 Chronic obstructive pulmonary disease, unspecified: Secondary | ICD-10-CM | POA: Diagnosis not present

## 2017-03-23 DIAGNOSIS — D631 Anemia in chronic kidney disease: Secondary | ICD-10-CM | POA: Diagnosis not present

## 2017-03-24 DIAGNOSIS — D631 Anemia in chronic kidney disease: Secondary | ICD-10-CM | POA: Diagnosis not present

## 2017-03-24 DIAGNOSIS — I120 Hypertensive chronic kidney disease with stage 5 chronic kidney disease or end stage renal disease: Secondary | ICD-10-CM | POA: Diagnosis not present

## 2017-03-24 DIAGNOSIS — N2581 Secondary hyperparathyroidism of renal origin: Secondary | ICD-10-CM | POA: Diagnosis not present

## 2017-03-24 DIAGNOSIS — N185 Chronic kidney disease, stage 5: Secondary | ICD-10-CM | POA: Diagnosis not present

## 2017-03-30 ENCOUNTER — Ambulatory Visit (INDEPENDENT_AMBULATORY_CARE_PROVIDER_SITE_OTHER): Payer: Self-pay | Admitting: Vascular Surgery

## 2017-03-30 ENCOUNTER — Encounter: Payer: Self-pay | Admitting: Vascular Surgery

## 2017-03-30 VITALS — BP 136/61 | HR 70 | Temp 98.3°F | Resp 18 | Ht 68.0 in | Wt 182.0 lb

## 2017-03-30 DIAGNOSIS — N184 Chronic kidney disease, stage 4 (severe): Secondary | ICD-10-CM

## 2017-03-30 NOTE — Progress Notes (Signed)
Patient name: Kyle [name] MRN: [id number] DOB: 1941-03-22 Sex: male  REASON FOR VISIT: Follow-up basilic vein transposition fistula  HPI: Kyle [name] is a 76 y.o. male here today follow-up. He underwent a single stage right basilic vein transposition fistula by myself on 02/17/2017. He had a failed brachiocephalic fistula on the right side. He is here today with his wife and daughter. He looks quite good. He reports that his renal function has remained stable with no plans for hemodialysis currently  Current Outpatient Prescriptions  Medication Sig Dispense Refill  . allopurinol (ZYLOPRIM) 100 MG tablet Take 100 mg by mouth every evening.     Marland Kitchen amLODipine (NORVASC) 5 MG tablet Take 5 mg by mouth every evening.    Marland Kitchen apixaban (ELIQUIS) 2.5 MG TABS tablet Take 1 tablet (2.5 mg total) by mouth 2 (two) times daily. 60 tablet 0  . aspirin EC 81 MG tablet Take 81 mg by mouth every evening.     . Calcium Carb-Cholecalciferol (CALTRATE 600+D3 SOFT PO) Take 1 tablet by mouth every evening.     . carvedilol (COREG) 25 MG tablet Take 25 mg by mouth 2 (two) times daily with a meal.    . Cholecalciferol (VITAMIN D3) 2000 units capsule Take 2,000 Units by mouth at bedtime.    . clopidogrel (PLAVIX) 75 MG tablet Take 75 mg by mouth every evening.     . cyanocobalamin (,VITAMIN B-12,) 1000 MCG/ML injection Inject 1,000 mcg into the muscle every 30 (thirty) days.    . ferrous sulfate 325 (65 FE) MG tablet Take 325 mg by mouth daily with breakfast.    . hydrALAZINE (APRESOLINE) 50 MG tablet Take 50 mg by mouth 2 (two) times daily.     . insulin aspart (NOVOLOG) 100 UNIT/ML injection Inject 10-15 Units into the skin 3 (three) times daily as needed for high blood sugar. Per sliding scale     . insulin NPH Human (NOVOLIN N) 100 UNIT/ML injection Inject 30 Units into the skin 2 (two) times daily.     . isosorbide mononitrate (IMDUR) 30 MG 24 hr tablet Take 90 mg by mouth  daily at 6 (six) AM.    . nitroGLYCERIN (NITROSTAT) 0.4 MG SL tablet Place 0.4 mg under the tongue every 5 (five) minutes as needed for chest pain.    Marland Kitchen omeprazole (PRILOSEC) 20 MG capsule Take 20 mg by mouth daily.    . Oxycodone HCl 10 MG TABS Take 0.5 tablets (5 mg total) by mouth every 6 (six) hours as needed (pain). 8 tablet 0  . Probiotic Product (PROBIOTIC MULTI-ENZYME PO) Take 1 tablet by mouth daily.    . ranolazine (RANEXA) 500 MG 12 hr tablet Take 1 tablet (500 mg total) by mouth 2 (two) times daily. 60 tablet 0  . rOPINIRole (REQUIP) 1 MG tablet Take 1 mg by mouth 2 (two) times daily.     . rosuvastatin (CRESTOR) 20 MG tablet Take 10 mg by mouth every evening.     . tamsulosin (FLOMAX) 0.4 MG CAPS capsule Take 0.4 mg by mouth at bedtime.     . torsemide (DEMADEX) 100 MG tablet Take 1 tablet (100 mg total) by mouth daily. 30 tablet 0  . torsemide (DEMADEX) 20 MG tablet Take 20 mg by mouth daily as needed (swelling).     No current facility-administered medications for this visit.      PHYSICAL EXAM: Vitals:   03/30/17 1452  BP: 136/61  Pulse: 70  Resp: 18  Temp: 98.3 F (36.8 C)  SpO2: 90%  Weight: 182 lb (82.6 kg)  Height: 5\' 8"  (1.727 m)    GENERAL: The patient is a well-nourished male, in no acute distress. The vital signs are documented above. He has very nice healing of his surgical incisions. Has excellent thrill throughout his fistula. The vein has nice maturation in size and isn't very straight course and is very superficial.  MEDICAL ISSUES: Very nice Dalylah Ramey result from single stage right basilic vein transposition fistula on 02/17/2017. He will see [place] again on an as-needed basis. I feel that he has an excellent chance for the use of this fistula should he come to hemodialysis. Would like to wait a total of 3 months of possible which would be Kain Milosevic September.   [name], MD FACS [facility] Tel [phone] Pager  [phone]

## 2017-04-05 DIAGNOSIS — N189 Chronic kidney disease, unspecified: Secondary | ICD-10-CM | POA: Diagnosis not present

## 2017-04-05 DIAGNOSIS — D631 Anemia in chronic kidney disease: Secondary | ICD-10-CM | POA: Diagnosis not present

## 2017-04-06 DIAGNOSIS — D631 Anemia in chronic kidney disease: Secondary | ICD-10-CM | POA: Diagnosis not present

## 2017-04-06 DIAGNOSIS — N184 Chronic kidney disease, stage 4 (severe): Secondary | ICD-10-CM | POA: Diagnosis not present

## 2017-04-20 DIAGNOSIS — D631 Anemia in chronic kidney disease: Secondary | ICD-10-CM | POA: Diagnosis not present

## 2017-04-20 DIAGNOSIS — N184 Chronic kidney disease, stage 4 (severe): Secondary | ICD-10-CM | POA: Diagnosis not present

## 2017-05-20 DIAGNOSIS — I2510 Atherosclerotic heart disease of native coronary artery without angina pectoris: Secondary | ICD-10-CM | POA: Diagnosis not present

## 2017-05-20 DIAGNOSIS — S41119A Laceration without foreign body of unspecified upper arm, initial encounter: Secondary | ICD-10-CM | POA: Diagnosis not present

## 2017-05-20 DIAGNOSIS — Z6825 Body mass index (BMI) 25.0-25.9, adult: Secondary | ICD-10-CM | POA: Diagnosis not present

## 2017-05-20 DIAGNOSIS — M1990 Unspecified osteoarthritis, unspecified site: Secondary | ICD-10-CM | POA: Diagnosis not present

## 2017-05-20 DIAGNOSIS — E538 Deficiency of other specified B group vitamins: Secondary | ICD-10-CM | POA: Diagnosis not present

## 2017-05-20 DIAGNOSIS — N184 Chronic kidney disease, stage 4 (severe): Secondary | ICD-10-CM | POA: Diagnosis not present

## 2017-05-20 DIAGNOSIS — I509 Heart failure, unspecified: Secondary | ICD-10-CM | POA: Diagnosis not present

## 2017-05-20 DIAGNOSIS — I4891 Unspecified atrial fibrillation: Secondary | ICD-10-CM | POA: Diagnosis not present

## 2017-05-20 DIAGNOSIS — N401 Enlarged prostate with lower urinary tract symptoms: Secondary | ICD-10-CM | POA: Diagnosis not present

## 2017-05-20 DIAGNOSIS — G2581 Restless legs syndrome: Secondary | ICD-10-CM | POA: Diagnosis not present

## 2017-05-20 DIAGNOSIS — E785 Hyperlipidemia, unspecified: Secondary | ICD-10-CM | POA: Diagnosis not present

## 2017-05-20 DIAGNOSIS — E1165 Type 2 diabetes mellitus with hyperglycemia: Secondary | ICD-10-CM | POA: Diagnosis not present

## 2017-06-03 DIAGNOSIS — N185 Chronic kidney disease, stage 5: Secondary | ICD-10-CM | POA: Diagnosis not present

## 2017-06-03 DIAGNOSIS — I120 Hypertensive chronic kidney disease with stage 5 chronic kidney disease or end stage renal disease: Secondary | ICD-10-CM | POA: Diagnosis not present

## 2017-06-03 DIAGNOSIS — D631 Anemia in chronic kidney disease: Secondary | ICD-10-CM | POA: Diagnosis not present

## 2017-06-03 DIAGNOSIS — N2581 Secondary hyperparathyroidism of renal origin: Secondary | ICD-10-CM | POA: Diagnosis not present

## 2017-06-09 IMAGING — CR DG CHEST 2V
2 series · 2 of 2 positions shown · non-contrast
Comparison: None in PACs

CLINICAL DATA: Shortness of breath, weakness, acute respiratory
failure and pneumonia, CHF, chronic renal insufficiency

EXAM:
CHEST  2 VIEW

[chest pa]
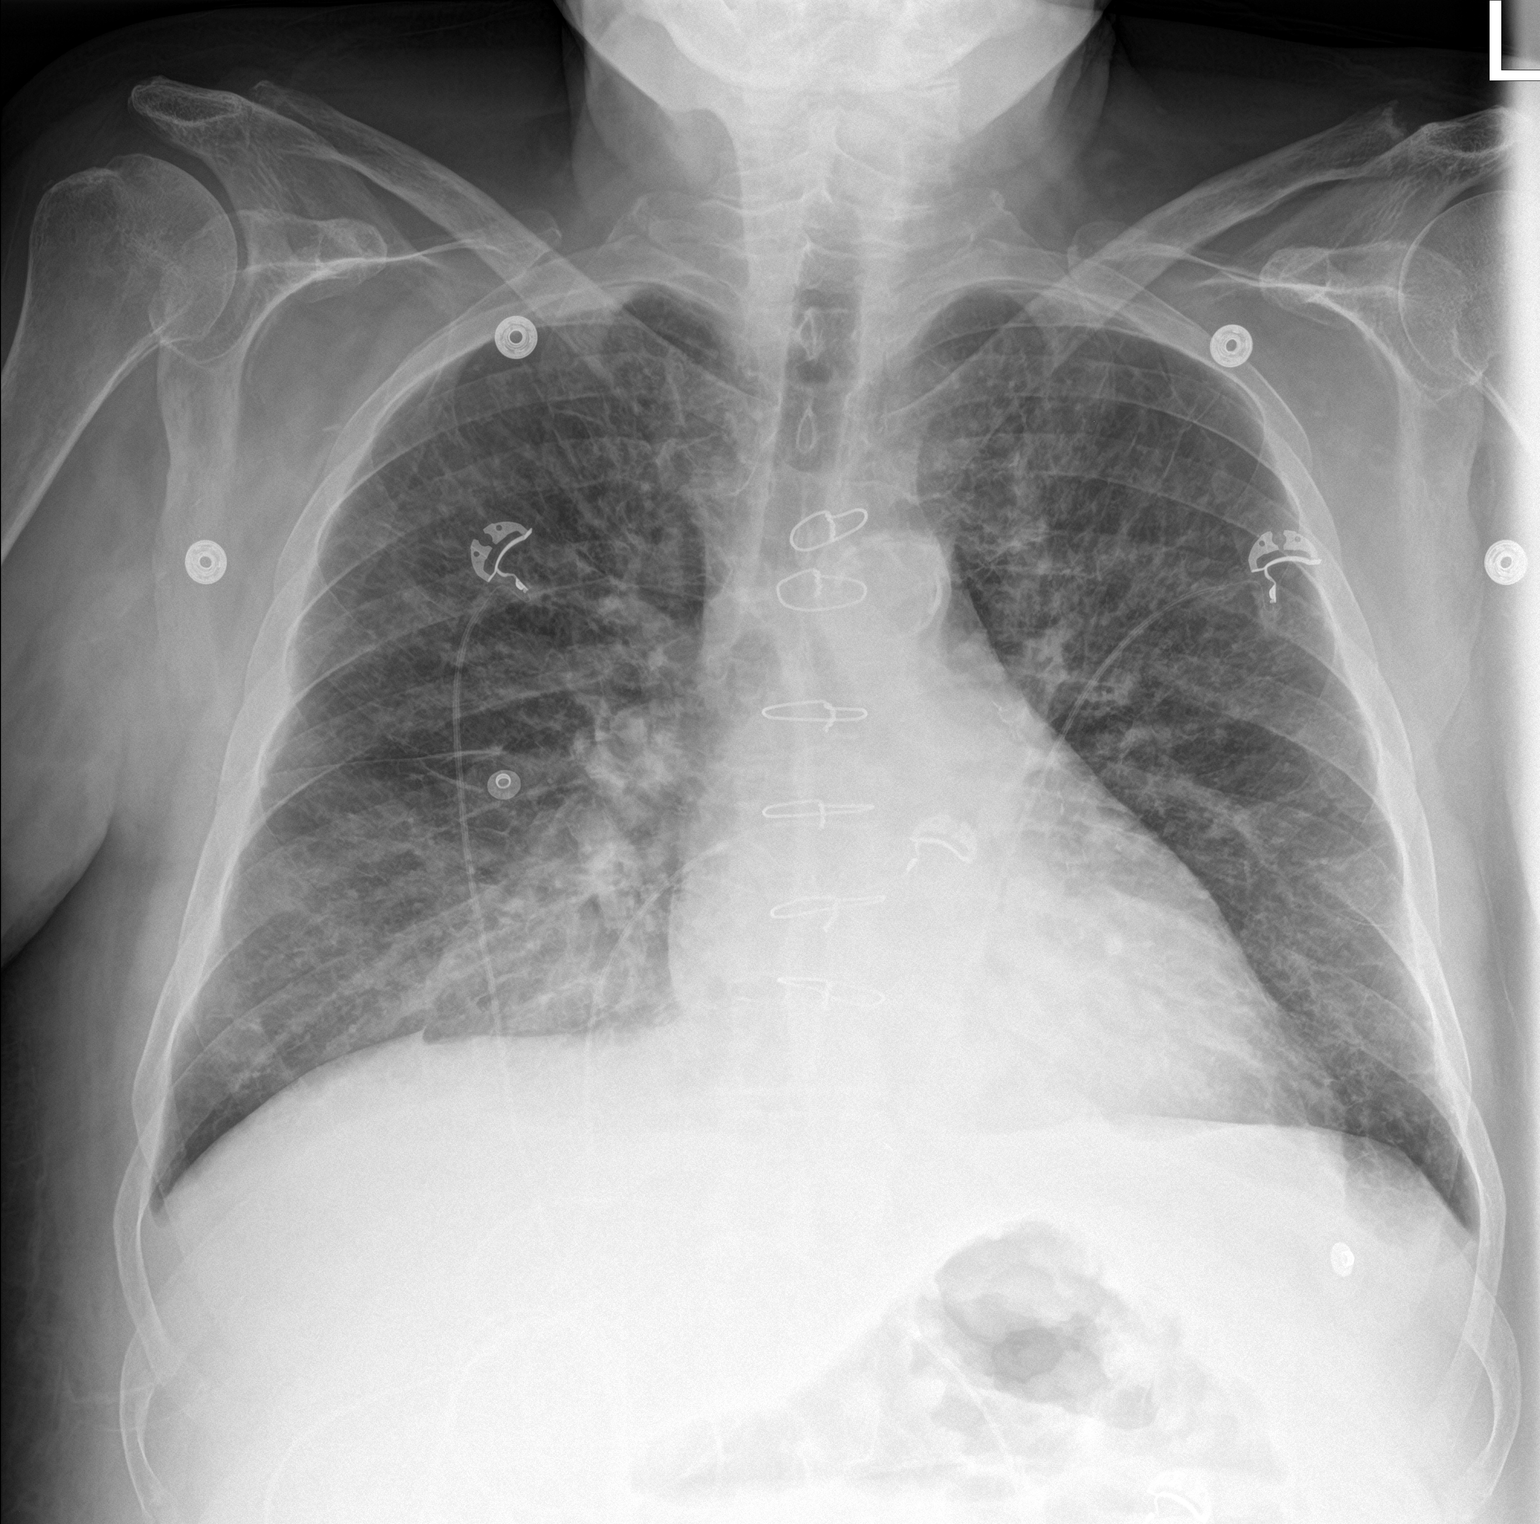

[chest lat]
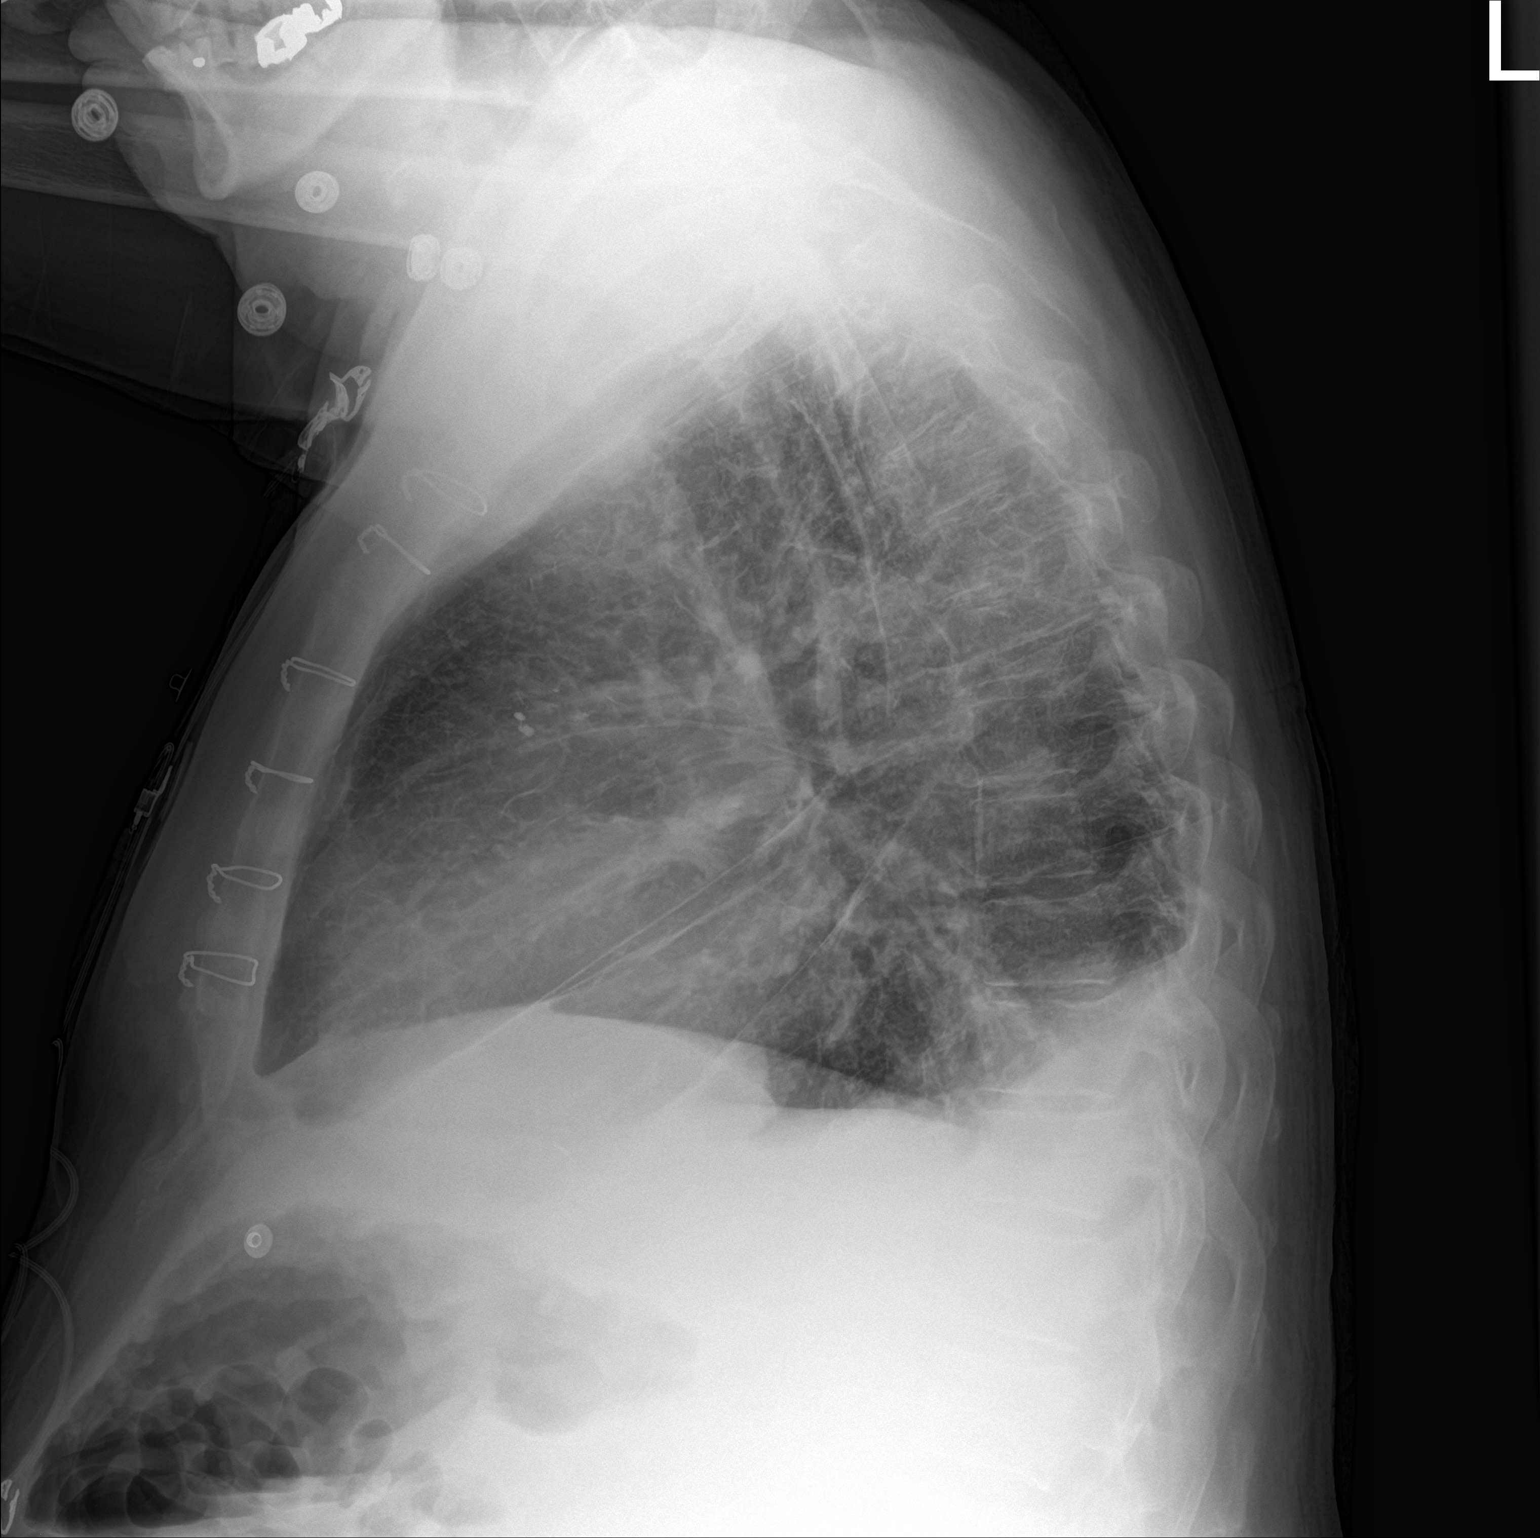

[2 of 2 positions shown; findings below may reference images not displayed]

FINDINGS: The lungs are adequately inflated. The interstitial markings are
increased bilaterally. Confluent density is present in the lower
lobes posteriorly. There are small bilateral pleural effusions
layering posteriorly. The heart is normal in size. There are sternal
wires from previous CABG. There is aortic atherosclerosis. The bony
thorax exhibits no acute abnormality.
IMPRESSION: Diffuse interstitial prominence consistent with interstitial edema
or pneumonia with confluent consolidation in both lower lobes with
small pleural effusions. Post CABG changes. Aortic atherosclerosis.

## 2017-07-20 DIAGNOSIS — Z23 Encounter for immunization: Secondary | ICD-10-CM | POA: Diagnosis not present

## 2017-08-20 DIAGNOSIS — D649 Anemia, unspecified: Secondary | ICD-10-CM | POA: Diagnosis not present

## 2017-08-20 DIAGNOSIS — N184 Chronic kidney disease, stage 4 (severe): Secondary | ICD-10-CM | POA: Diagnosis not present

## 2017-08-20 DIAGNOSIS — I509 Heart failure, unspecified: Secondary | ICD-10-CM | POA: Diagnosis not present

## 2017-08-20 DIAGNOSIS — Z79899 Other long term (current) drug therapy: Secondary | ICD-10-CM | POA: Diagnosis not present

## 2017-08-20 DIAGNOSIS — E538 Deficiency of other specified B group vitamins: Secondary | ICD-10-CM | POA: Diagnosis not present

## 2017-08-20 DIAGNOSIS — M109 Gout, unspecified: Secondary | ICD-10-CM | POA: Diagnosis not present

## 2017-08-20 DIAGNOSIS — Z6825 Body mass index (BMI) 25.0-25.9, adult: Secondary | ICD-10-CM | POA: Diagnosis not present

## 2017-08-20 DIAGNOSIS — N401 Enlarged prostate with lower urinary tract symptoms: Secondary | ICD-10-CM | POA: Diagnosis not present

## 2017-08-20 DIAGNOSIS — I4891 Unspecified atrial fibrillation: Secondary | ICD-10-CM | POA: Diagnosis not present

## 2017-08-20 DIAGNOSIS — Z1331 Encounter for screening for depression: Secondary | ICD-10-CM | POA: Diagnosis not present

## 2017-08-20 DIAGNOSIS — G2581 Restless legs syndrome: Secondary | ICD-10-CM | POA: Diagnosis not present

## 2017-08-20 DIAGNOSIS — E1165 Type 2 diabetes mellitus with hyperglycemia: Secondary | ICD-10-CM | POA: Diagnosis not present

## 2017-08-20 DIAGNOSIS — I2510 Atherosclerotic heart disease of native coronary artery without angina pectoris: Secondary | ICD-10-CM | POA: Diagnosis not present

## 2017-08-20 DIAGNOSIS — E785 Hyperlipidemia, unspecified: Secondary | ICD-10-CM | POA: Diagnosis not present

## 2017-08-31 DIAGNOSIS — D631 Anemia in chronic kidney disease: Secondary | ICD-10-CM | POA: Diagnosis not present

## 2017-08-31 DIAGNOSIS — N189 Chronic kidney disease, unspecified: Secondary | ICD-10-CM | POA: Diagnosis not present

## 2017-09-21 DIAGNOSIS — E538 Deficiency of other specified B group vitamins: Secondary | ICD-10-CM | POA: Diagnosis not present

## 2017-09-21 DIAGNOSIS — N184 Chronic kidney disease, stage 4 (severe): Secondary | ICD-10-CM | POA: Diagnosis not present

## 2017-09-21 DIAGNOSIS — M272 Inflammatory conditions of jaws: Secondary | ICD-10-CM | POA: Diagnosis not present

## 2017-09-21 DIAGNOSIS — I509 Heart failure, unspecified: Secondary | ICD-10-CM | POA: Diagnosis not present

## 2017-09-21 DIAGNOSIS — M109 Gout, unspecified: Secondary | ICD-10-CM | POA: Diagnosis not present

## 2017-09-21 DIAGNOSIS — M1990 Unspecified osteoarthritis, unspecified site: Secondary | ICD-10-CM | POA: Diagnosis not present

## 2017-09-21 DIAGNOSIS — N401 Enlarged prostate with lower urinary tract symptoms: Secondary | ICD-10-CM | POA: Diagnosis not present

## 2017-09-21 DIAGNOSIS — K047 Periapical abscess without sinus: Secondary | ICD-10-CM | POA: Diagnosis not present

## 2017-09-21 DIAGNOSIS — E785 Hyperlipidemia, unspecified: Secondary | ICD-10-CM | POA: Diagnosis not present

## 2017-09-21 DIAGNOSIS — D649 Anemia, unspecified: Secondary | ICD-10-CM | POA: Diagnosis not present

## 2017-09-21 DIAGNOSIS — I2510 Atherosclerotic heart disease of native coronary artery without angina pectoris: Secondary | ICD-10-CM | POA: Diagnosis not present

## 2017-09-21 DIAGNOSIS — G2581 Restless legs syndrome: Secondary | ICD-10-CM | POA: Diagnosis not present

## 2017-09-21 DIAGNOSIS — K122 Cellulitis and abscess of mouth: Secondary | ICD-10-CM | POA: Diagnosis not present

## 2017-09-21 DIAGNOSIS — R220 Localized swelling, mass and lump, head: Secondary | ICD-10-CM | POA: Diagnosis not present

## 2017-09-21 DIAGNOSIS — K089 Disorder of teeth and supporting structures, unspecified: Secondary | ICD-10-CM | POA: Diagnosis not present

## 2017-09-25 IMAGING — CR DG CHEST 1V PORT
1 series · 1 of 1 positions shown · non-contrast
Comparison: Chest radiograph dated 07/05/2016 and CT dated
01/09/2016

CLINICAL DATA: 74-year-old male with respiratory failure.

EXAM:
PORTABLE CHEST 1 VIEW

[AP]
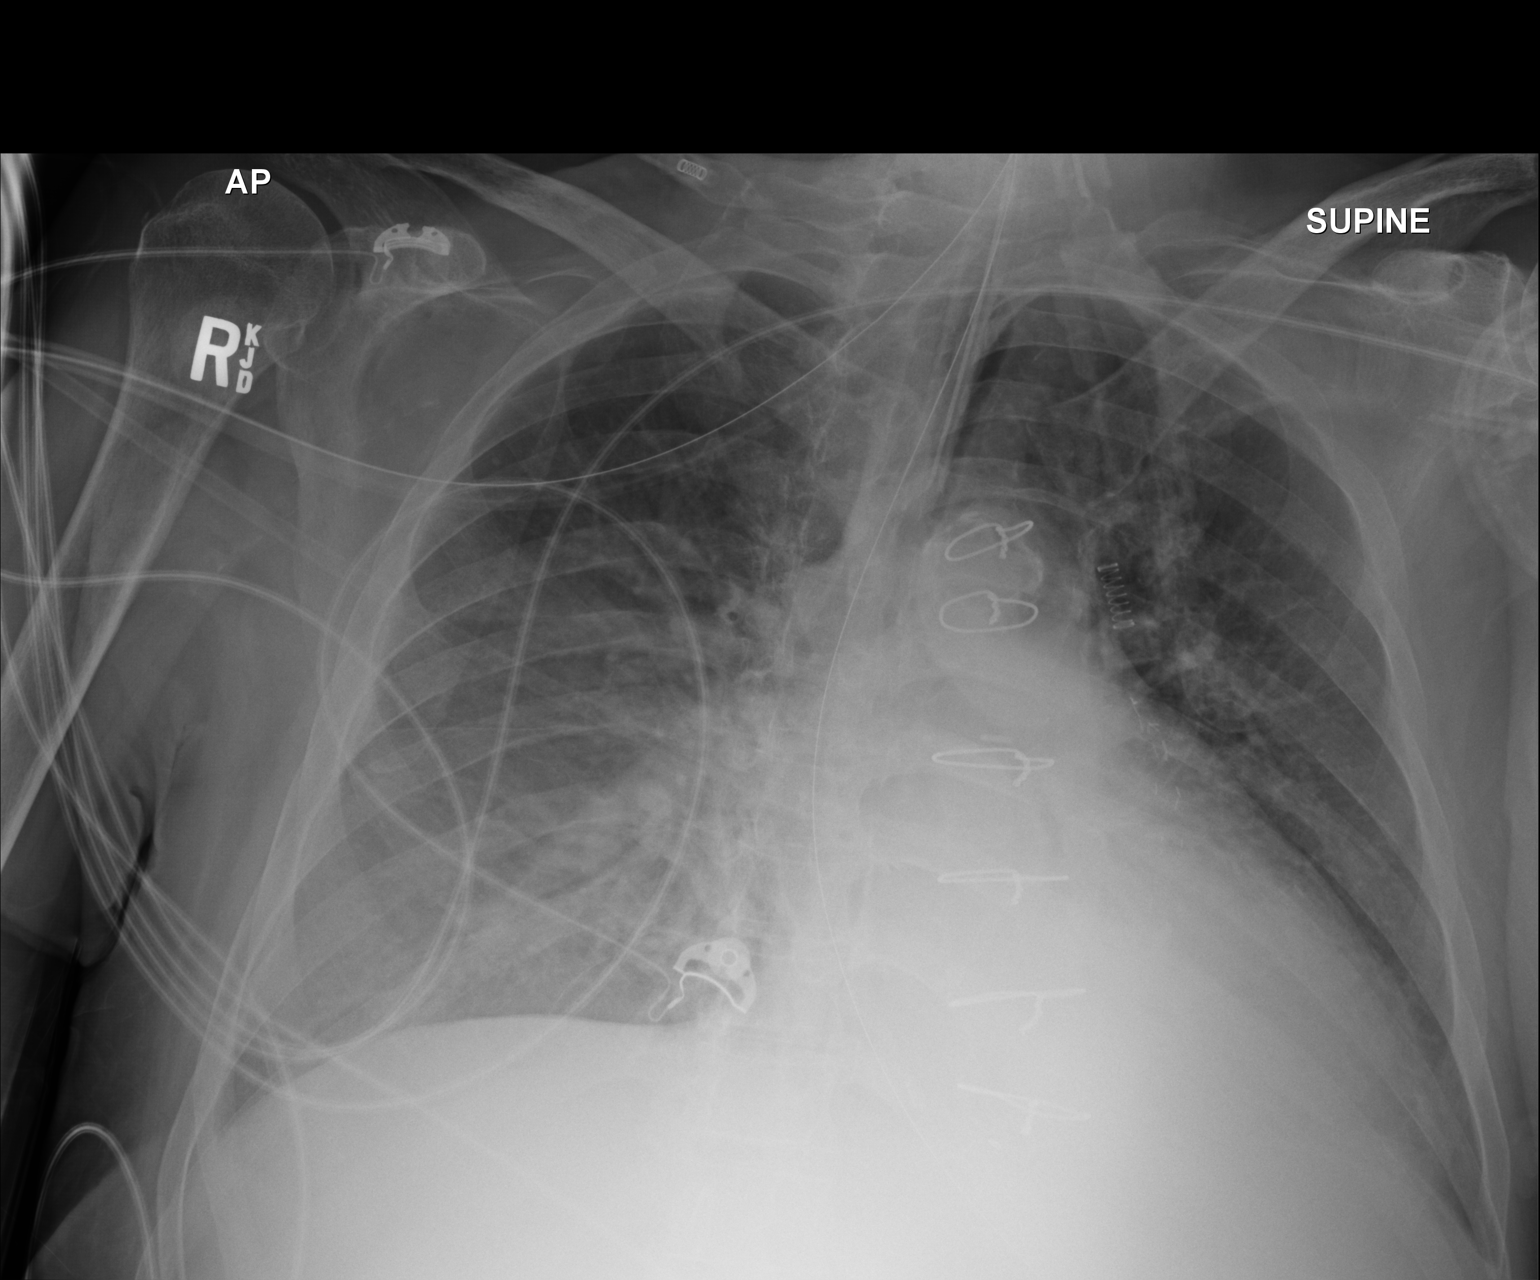

[1 of 1 positions shown; findings below may reference images not displayed]

FINDINGS: Endotracheal tube above the carina in stable positioning. Enteric
tube courses into the left upper abdomen with tip beyond the
inferior margin of the image.

There is cardiomegaly moderate with mild interstitial prominence. An
area of hazy density involving the right mid to lower lung field is
concerning for infiltrates. Clinical correlation and follow-up
recommended. The left lung is clear. No pleural effusion or
pneumothorax. Median sternotomy wires and CABG vascular clips noted.
IMPRESSION: Right mid to lower lung field hazy density concerning for
infiltrate. Clinical correlation follow-up recommended.

Moderate cardiomegaly.

## 2017-09-29 ENCOUNTER — Telehealth: Payer: Self-pay | Admitting: Cardiology

## 2017-09-29 NOTE — Telephone Encounter (Signed)
Patient has an abscessed tooth and needs it pulled but dentist will not schedule until Dr. Dewaine Conger patient off Eloquis

## 2017-09-30 DIAGNOSIS — K05219 Aggressive periodontitis, localized, unspecified severity: Secondary | ICD-10-CM | POA: Diagnosis not present

## 2017-09-30 DIAGNOSIS — K047 Periapical abscess without sinus: Secondary | ICD-10-CM | POA: Diagnosis not present

## 2017-09-30 DIAGNOSIS — K089 Disorder of teeth and supporting structures, unspecified: Secondary | ICD-10-CM | POA: Diagnosis not present

## 2017-09-30 NOTE — Telephone Encounter (Signed)
Left voicemail for the patient to call the office. 

## 2017-10-04 DIAGNOSIS — R0602 Shortness of breath: Secondary | ICD-10-CM | POA: Insufficient documentation

## 2017-10-04 NOTE — Telephone Encounter (Signed)
Patient has had toothache for 1 week and Dr. Prince Rome states that he will not do surgery until Dr. Bettina Gavia has given the okay due to patient taking Eliquis and Plavix. Patient has not been seen by Dr. Bettina Gavia since 10/2016. Appointment made for tomorrow morning at 9:20 am. Patient verbalized understanding. No further questions.

## 2017-10-05 ENCOUNTER — Ambulatory Visit: Payer: PPO | Admitting: Cardiology

## 2017-10-05 ENCOUNTER — Telehealth: Payer: Self-pay

## 2017-10-05 ENCOUNTER — Encounter: Payer: Self-pay | Admitting: Cardiology

## 2017-10-05 ENCOUNTER — Other Ambulatory Visit: Payer: Self-pay

## 2017-10-05 VITALS — BP 154/54 | HR 81 | Ht 68.0 in | Wt 185.1 lb

## 2017-10-05 DIAGNOSIS — I10 Essential (primary) hypertension: Secondary | ICD-10-CM | POA: Diagnosis not present

## 2017-10-05 DIAGNOSIS — D631 Anemia in chronic kidney disease: Secondary | ICD-10-CM | POA: Diagnosis not present

## 2017-10-05 DIAGNOSIS — I482 Chronic atrial fibrillation, unspecified: Secondary | ICD-10-CM

## 2017-10-05 DIAGNOSIS — I110 Hypertensive heart disease with heart failure: Secondary | ICD-10-CM | POA: Diagnosis not present

## 2017-10-05 DIAGNOSIS — D649 Anemia, unspecified: Secondary | ICD-10-CM | POA: Insufficient documentation

## 2017-10-05 DIAGNOSIS — I5032 Chronic diastolic (congestive) heart failure: Secondary | ICD-10-CM | POA: Diagnosis not present

## 2017-10-05 DIAGNOSIS — I2510 Atherosclerotic heart disease of native coronary artery without angina pectoris: Secondary | ICD-10-CM

## 2017-10-05 DIAGNOSIS — E0822 Diabetes mellitus due to underlying condition with diabetic chronic kidney disease: Secondary | ICD-10-CM

## 2017-10-05 DIAGNOSIS — Z01810 Encounter for preprocedural cardiovascular examination: Secondary | ICD-10-CM

## 2017-10-05 DIAGNOSIS — I25119 Atherosclerotic heart disease of native coronary artery with unspecified angina pectoris: Secondary | ICD-10-CM

## 2017-10-05 DIAGNOSIS — N184 Chronic kidney disease, stage 4 (severe): Secondary | ICD-10-CM

## 2017-10-05 DIAGNOSIS — Z7901 Long term (current) use of anticoagulants: Secondary | ICD-10-CM

## 2017-10-05 DIAGNOSIS — E785 Hyperlipidemia, unspecified: Secondary | ICD-10-CM | POA: Diagnosis not present

## 2017-10-05 NOTE — Patient Instructions (Signed)
Medication Instructions:  Your physician recommends that you continue on your current medications as directed. Please refer to the Current Medication list given to you today.  Labwork: None  Testing/Procedures: Your physician has requested that you have a lexiscan myoview. For further information please visit [email or web address]. Please follow instruction sheet, as given.  Follow-Up: Your physician recommends that you schedule a follow-up appointment in: 1 month  Any Other Special Instructions Will Be Listed Below (If Applicable).     If you need a refill on your cardiac medications before your next appointment, please call your pharmacy.   [place], RN, BSN

## 2017-10-05 NOTE — Progress Notes (Signed)
Cardiology Office Note:    Date:  10/05/2017   ID:  Kyle [name], DOB Jun 21, 1941, MRN [id number]  PCP:  Kyle Doyne., [name]  Cardiologist:  Kyle [name]   Referring [name]: Kyle Doyne., [name]    ASSESSMENT:    1. Pre-operative cardiovascular examination   2. Chronic diastolic heart failure ([place])   3. Hypertensive heart disease with heart failure ([place])   4. CKD (chronic kidney disease) stage 4, GFR 15-29 ml/min (HCC)   5. Chronic anticoagulation   6. Hyperlipidemia, unspecified hyperlipidemia type   7. Anemia due to stage 4 chronic kidney disease ([place])   8. Essential hypertension   9. Coronary artery disease involving native coronary artery of native heart with angina pectoris ([place])   10. Chronic atrial fibrillation (HCC)   11. Coronary artery disease involving native coronary artery of native heart without angina pectoris   12. Diabetes mellitus due to underlying condition with chronic kidney disease, without long-term current use of insulin, unspecified CKD stage ([place])    PLAN:    In order of problems listed above:  1. Secondary prevention stressed with the patient.  Importance of compliance with diet and medications stressed and he vocalized understanding.  Preoperative risk stratification was also discussed.  The patient denies any chest pain however leads a sedentary lifestyle.  To assess his risks in view of multiple comorbidities, I recommended Lexiscan sestamibi stress testing.  If this is negative then he is not at high risk for coronary events during the aforementioned surgery.  Meticulous hemodynamic monitoring and continued beta-blocker will further reduce the risk of coronary events. 2. I will also check with our pharmacist to see whether in light of his advanced renal insufficiency he will be a candidate for anticoagulation with warfarin for whether we can continue Eliquis.  Patient has multiple issues with bruising and superficial bleeding especially cutaneous and in  the future I will consider stopping his aspirin therapy keep him on his Plavix and anticoagulation.  Benefits and potential risks explained and he vocalized understanding. 3. At this time he is cardiovascular risks are well optimized.  His blood pressure essentially is fine at home he has an element of whitecoat hypertension.  He is on good medications such as beta-blockers and ranolazine. He will be seen in follow-up appointment in a month or earlier if he has any concerns.   Medication Adjustments/Labs and Tests Ordered: Current medicines are reviewed at length with the patient today.  Concerns regarding medicines are outlined above.  Orders Placed This Encounter  Procedures  . EKG 12-Lead   No orders of the defined types were placed in this encounter.    Chief Complaint  Patient presents with  . Medical Clearance     History of Present Illness:    Kyle [name] is a 77 y.o. male.  Patient has past medical history of coronary artery disease.  On the last angiography report he had disease which was fairly advanced and the only option at that time was medical management.  He sees my partner and now has opted to see me for evaluation.  I have not seen him much in the office and his medical history is new to me.  He has history of diabetes mellitus, essential hypertension and dyslipidemia and chronic atrial fibrillation.  No chest pain orthopnea or PND however he leads a sedentary lifestyle.  He is here to be evaluated for tooth extraction.  Past Medical History:  Diagnosis Date  .  Acute on chronic diastolic CHF (congestive heart failure) ([place]) 07/07/2016  . Acute respiratory failure with hypoxia ([place]) 03/19/2016  . AKI (acute kidney injury) ([place])   . Anemia   . Arthritis    "I'm eat up w/it" (07/08/2016)  . Atrial fibrillation ([place])   . BPH (benign prostatic hyperplasia)   . CAD (coronary artery disease)   . CAP (community acquired pneumonia) 03/19/2016  . Chronic anticoagulation  01/22/2016  . Chronic diastolic heart failure ([place]) 01/22/2016   Overview:  3 recent admissions with decompensated heart failure  . Chronic kidney disease, stage IV (severe) ([place])   . Chronic systolic (congestive) heart failure ([place])   . CKD (chronic kidney disease), stage IV ([place])   . Coronary artery disease involving native heart with angina pectoris [facility])    Overview:  coronary angiography July 2017 showed occlusion of his native coronary arteries and vein grafts and he is perfused by left thoracic artery judged to be best treated medically.  . Depression with anxiety   . DM due to underlying condition with diabetic chronic kidney disease ([place])    Type 2  . Essential hypertension   . Fluid overload 03/19/2016  . GERD (gastroesophageal reflux disease)   . GERD (gastroesophageal reflux disease)   . Gout   . H/O coronary artery bypass surgery 03/23/2012  . Headache   . Heart failure ([place]) 03/24/2012  . HLD (hyperlipidemia)   . Hyperlipidemia   . Hypertensive heart disease with heart failure ([place]) 01/22/2016  . Myocardial infarction ([place])    "I've had several light heart attacks over this past year; might have had one this time" (07/08/2016)  . NSTEMI (non-ST elevated myocardial infarction) ([place]) 03/19/2016  . On home oxygen therapy    "2L prn" (07/08/2016)  . Pneumonia   . PONV (postoperative nausea and vomiting)   . Restless leg syndrome   . Shortness of breath   . Shortness of breath dyspnea   . Status post coronary artery stent placement 03/23/2012  . Uncontrolled type 2 diabetes mellitus with complication [facility] [place])     Past Surgical History:  Procedure Laterality Date  . AV FISTULA PLACEMENT Right 07/13/2016   Procedure: ARTERIOVENOUS (AV) FISTULA CREATION RIGHT LOWER ARM;  Surgeon: [name], [name];  Location: [place];  Service: Vascular;  Laterality: Right;  . BASCILIC VEIN TRANSPOSITION Right 02/17/2017   Procedure: BASILIC VEIN TRANSPOSITION RIGHT ARM;  Surgeon: [name], [name];   Location: [place];  Service: Vascular;  Laterality: Right;  . CARDIAC CATHETERIZATION N/A 03/24/2016   Procedure: Right/Left Heart Cath and Coronary/Graft Angiography;  Surgeon: Troy Sine, [name];  Location: [place] CV [facility];  Service: Cardiovascular;  Laterality: N/A;  . CATARACT EXTRACTION W/ INTRAOCULAR LENS  IMPLANT, BILATERAL Bilateral   . COLONOSCOPY    . CORONARY ANGIOPLASTY WITH STENT PLACEMENT     "I've had a few; some w/stents" (07/08/2016)  . CORONARY ARTERY BYPASS GRAFT  ?1995   CABG X"4"    Current Medications: Current Meds  Medication Sig  . allopurinol (ZYLOPRIM) 100 MG tablet Take 100 mg by mouth every evening.   Marland Kitchen amLODipine (NORVASC) 5 MG tablet Take 5 mg by mouth every evening.  Marland Kitchen apixaban (ELIQUIS) 2.5 MG TABS tablet Take 1 tablet (2.5 mg total) by mouth 2 (two) times daily.  Marland Kitchen aspirin EC 81 MG tablet Take 81 mg by mouth every evening.   . Calcium Carb-Cholecalciferol (CALTRATE 600+D3 SOFT PO) Take 1 tablet by mouth every evening.   Marland Kitchen  carvedilol (COREG) 25 MG tablet Take 25 mg by mouth 2 (two) times daily with a meal.  . Cholecalciferol (VITAMIN D3) 2000 units capsule Take 2,000 Units by mouth at bedtime.  . clopidogrel (PLAVIX) 75 MG tablet Take 75 mg by mouth every evening.   . cyanocobalamin (,VITAMIN B-12,) 1000 MCG/ML injection Inject 1,000 mcg into the muscle every 30 (thirty) days.  . ferrous sulfate 325 (65 FE) MG tablet Take 325 mg by mouth daily with breakfast.  . hydrALAZINE (APRESOLINE) 50 MG tablet Take 50 mg by mouth 2 (two) times daily.   . insulin aspart (NOVOLOG) 100 UNIT/ML injection Inject 10-15 Units into the skin 3 (three) times daily as needed for high blood sugar. Per sliding scale   . insulin NPH Human (NOVOLIN N) 100 UNIT/ML injection Inject 30 Units into the skin 2 (two) times daily.   . isosorbide mononitrate (IMDUR) 30 MG 24 hr tablet Take 90 mg by mouth daily at 6 (six) AM.  . nitroGLYCERIN (NITROSTAT) 0.4 MG SL tablet Place 0.4 mg under  the tongue every 5 (five) minutes as needed for chest pain.  Marland Kitchen omeprazole (PRILOSEC) 20 MG capsule Take 20 mg by mouth daily.  . Oxycodone HCl 10 MG TABS Take 0.5 tablets (5 mg total) by mouth every 6 (six) hours as needed (pain).  . Probiotic Product (PROBIOTIC MULTI-ENZYME PO) Take 1 tablet by mouth daily.  . ranolazine (RANEXA) 500 MG 12 hr tablet Take 1 tablet (500 mg total) by mouth 2 (two) times daily.  Marland Kitchen rOPINIRole (REQUIP) 1 MG tablet Take 1 mg by mouth 2 (two) times daily.   . rosuvastatin (CRESTOR) 5 MG tablet Take 5 mg by mouth daily.  . tamsulosin (FLOMAX) 0.4 MG CAPS capsule Take 0.4 mg by mouth at bedtime.   . torsemide (DEMADEX) 100 MG tablet Take 1 tablet (100 mg total) by mouth daily.  Marland Kitchen torsemide (DEMADEX) 20 MG tablet Take 20 mg by mouth daily as needed (swelling).     Allergies:   No known allergies   Social History   Socioeconomic History  . Marital status: Married    Spouse name: None  . Number of children: None  . Years of education: None  . Highest education level: None  Social Needs  . Financial resource strain: None  . Food insecurity - worry: None  . Food insecurity - inability: None  . Transportation needs - medical: None  . Transportation needs - non-medical: None  Occupational History  . None  Tobacco Use  . Smoking status: Former Smoker    Packs/day: 2.00    Years: 12.00    Pack years: 24.00    Types: Cigarettes    Last attempt to quit: 1970    Years since quitting: 49.0  . Smokeless tobacco: Never Used  Substance and Sexual Activity  . Alcohol use: Yes    Alcohol/week: 0.0 oz    Comment: 07/08/2016 nothing since 1970"  . Drug use: No  . Sexual activity: None  Other Topics Concern  . None  Social History Narrative  . None     Family History: The patient's family history includes CAD in his mother; Diabetes in his brother, father, and sister; Heart disease in his father and mother; Lung cancer in his brother; Stroke in his  father.  ROS:   Please see the history of present illness.    All other systems reviewed and are negative.  EKGs/Labs/Other Studies Reviewed:    The following studies were reviewed  today: EKG done today reveals sinus rhythm and nonspecific ST-T changes.   Recent Labs: 02/17/2017: Hemoglobin 10.5; Potassium 4.8; Sodium 137  Recent Lipid Panel    Component Value Date/Time   TRIG 82 07/06/2016 0427    Physical Exam:    VS:  BP (!) 154/54 (BP Location: Right Arm, Patient Position: Sitting, Cuff Size: Large)   Pulse 81   Ht 5\' 8"  (1.727 m)   Wt 185 lb 1.9 oz (84 kg)   SpO2 94%   BMI 28.15 kg/m     Wt Readings from Last 3 Encounters:  10/05/17 185 lb 1.9 oz (84 kg)  03/30/17 182 lb (82.6 kg)  02/17/17 180 lb (81.6 kg)     GEN: Patient is in no acute distress HEENT: Normal NECK: No JVD; No carotid bruits LYMPHATICS: No lymphadenopathy CARDIAC: Hear sounds regular, 2/6 systolic murmur at the apex. RESPIRATORY:  Clear to auscultation without rales, wheezing or rhonchi  ABDOMEN: Soft, non-tender, non-distended MUSCULOSKELETAL:  No edema; No deformity  SKIN: Warm and dry NEUROLOGIC:  Alert and oriented x 3 PSYCHIATRIC:  Normal affect   Signed, Kyle [name]  10/05/2017 10:07 AM    [place]

## 2017-10-05 NOTE — Telephone Encounter (Signed)
Patient refused to complete lexiscan as he stated that "he would not be going that route right now." Patient was informed that without this testing Dr. Geraldo Pitter could not advise for his upcoming tooth removal; patient was agreeable to this.

## 2017-10-05 NOTE — Telephone Encounter (Signed)
Spoke with Dr. Viviann Spare nurse Abbi and informed her that the labs that were collected at their office in December indicated a creatinine of 3.90. According to the patient's pharmacist eliquis is not indicated for a creatinine greater than 2.4. The patient had not had the medication filled at his pharmacy in over a year as he has been getting samples from his PCP. Per Dr. Geraldo Pitter he recommends that the patient be managed with Coumadin; Abbi was informed of this recommendation and they will continue managing his anticoagulation care.

## 2017-10-05 NOTE — Progress Notes (Deleted)
Cardiology Office Note:    Date:  10/05/2017   ID:  Kyle [name], DOB [birth year], MRN [id number]  PCP:  Ocie Doyne., MD  Cardiologist:  Shirlee More, MD    Referring MD: Ocie Doyne., MD    ASSESSMENT:    No diagnosis found. PLAN:    In order of problems listed above:  1. ***   Next appointment: ***   Medication Adjustments/Labs and Tests Ordered: Current medicines are reviewed at length with the patient today.  Concerns regarding medicines are outlined above.  No orders of the defined types were placed in this encounter.  No orders of the defined types were placed in this encounter.   No chief complaint on file.   History of Present Illness:    Kyle [name] is a 77 y.o. male with a hx of CAD EF 50-55%, CHF,chronic atrial fibrillation  Dyslipidemia, HTN, S/P CABG, S/P PCI, COPD,anemia  and severe CKD last seen in February 2018. Coronary angiography July 2017 showed occlusion of his native coronary arteries and vein grafts and is perfused by left thoracic artery and a radial graft to the [place] advised medical therapy. Compliance with diet, lifestyle and medications: *** Past Medical History:  Diagnosis Date  . Acute on chronic diastolic CHF (congestive heart failure) ([place]) 07/07/2016  . Acute respiratory failure with hypoxia ([place]) 03/19/2016  . AKI (acute kidney injury) ([place])   . Anemia   . Arthritis    "I'm eat up w/it" (07/08/2016)  . Atrial fibrillation ([place])   . BPH (benign prostatic hyperplasia)   . CAD (coronary artery disease)   . CAP (community acquired pneumonia) 03/19/2016  . Chronic anticoagulation 01/22/2016  . Chronic diastolic heart failure ([place]) 01/22/2016   Overview:  3 recent admissions with decompensated heart failure  . Chronic kidney disease, stage IV (severe) ([place])   . Chronic systolic (congestive) heart failure ([place])   . CKD (chronic kidney disease), stage IV ([place])   . Coronary artery disease involving native heart with angina pectoris  [facility])    Overview:  coronary angiography July 2017 showed occlusion of his native coronary arteries and vein grafts and he is perfused by left thoracic artery judged to be best treated medically.  . Depression with anxiety   . DM due to underlying condition with diabetic chronic kidney disease ([place])    Type 2  . Essential hypertension   . Fluid overload 03/19/2016  . GERD (gastroesophageal reflux disease)   . GERD (gastroesophageal reflux disease)   . Gout   . H/O coronary artery bypass surgery 03/23/2012  . Headache   . Heart failure ([place]) 03/24/2012  . HLD (hyperlipidemia)   . Hyperlipidemia   . Hypertensive heart disease with heart failure ([place]) 01/22/2016  . Myocardial infarction ([place])    "I've had several light heart attacks over this past year; might have had one this time" (07/08/2016)  . NSTEMI (non-ST elevated myocardial infarction) ([place]) 03/19/2016  . On home oxygen therapy    "2L prn" (07/08/2016)  . Pneumonia   . PONV (postoperative nausea and vomiting)   . Restless leg syndrome   . Shortness of breath   . Shortness of breath dyspnea   . Status post coronary artery stent placement 03/23/2012  . Uncontrolled type 2 diabetes mellitus with complication [facility])     Past Surgical History:  Procedure Laterality Date  . AV FISTULA PLACEMENT Right 07/13/2016   Procedure: ARTERIOVENOUS (AV) FISTULA CREATION RIGHT LOWER ARM;  Surgeon: Arvilla Meres  Early, MD;  Location: [facility] OR;  Service: Vascular;  Laterality: Right;  . BASCILIC VEIN TRANSPOSITION Right 02/17/2017   Procedure: BASILIC VEIN TRANSPOSITION RIGHT ARM;  Surgeon: [name], MD;  Location: [place];  Service: Vascular;  Laterality: Right;  . CARDIAC CATHETERIZATION N/A 03/24/2016   Procedure: Right/Left Heart Cath and Coronary/Graft Angiography;  Surgeon: Troy Sine, MD;  Location: [place] CV LAB;  Service: Cardiovascular;  Laterality: N/A;  . CATARACT EXTRACTION W/ INTRAOCULAR LENS  IMPLANT, BILATERAL Bilateral   . COLONOSCOPY     . CORONARY ANGIOPLASTY WITH STENT PLACEMENT     "I've had a few; some w/stents" (07/08/2016)  . CORONARY ARTERY BYPASS GRAFT  ?1995   CABG X"4"    Current Medications: No outpatient medications have been marked as taking for the 10/05/17 encounter (Appointment) with Richardo Priest, MD.     Allergies:   No known allergies   Social History   Socioeconomic History  . Marital status: Married    Spouse name: Not on file  . Number of children: Not on file  . Years of education: Not on file  . Highest education level: Not on file  Social Needs  . Financial resource strain: Not on file  . Food insecurity - worry: Not on file  . Food insecurity - inability: Not on file  . Transportation needs - medical: Not on file  . Transportation needs - non-medical: Not on file  Occupational History  . Not on file  Tobacco Use  . Smoking status: Former Smoker    Packs/day: 2.00    Years: 12.00    Pack years: 24.00    Types: Cigarettes    Last attempt to quit: 1970    Years since quitting: 49.0  . Smokeless tobacco: Never Used  Substance and Sexual Activity  . Alcohol use: Yes    Alcohol/week: 0.0 oz    Comment: 07/08/2016 nothing since 1970"  . Drug use: No  . Sexual activity: Not on file  Other Topics Concern  . Not on file  Social History Narrative  . Not on file     Family History: The patient's ***family history includes CAD in his mother; Diabetes in his brother, father, and sister; Heart disease in his father and mother; Lung cancer in his brother; Stroke in his father. ROS:   Please see the history of present illness.    All other systems reviewed and are negative.  EKGs/Labs/Other Studies Reviewed:    The following studies were reviewed today:  EKG:  EKG ordered today.  The ekg ordered today demonstrates ***  Recent Labs: 08/30/17 Hgb 9.9, Cr 3.9, GFR 14cc/min,K 5.4  Ferritin 89 02/17/2017: Hemoglobin 10.5; Potassium 4.8; Sodium 137  Recent Lipid Panel Chol 95, HDL  41, LDL 40    Component Value Date/Time   TRIG 82 07/06/2016 0427    Physical Exam:    VS:  There were no vitals taken for this visit.    Wt Readings from Last 3 Encounters:  03/30/17 182 lb (82.6 kg)  02/17/17 180 lb (81.6 kg)  01/12/17 185 lb (83.9 kg)     GEN: *** Well nourished, well developed in no acute distress HEENT: Normal NECK: No JVD; No carotid bruits LYMPHATICS: No lymphadenopathy CARDIAC: ***RRR, no murmurs, rubs, gallops RESPIRATORY:  Clear to auscultation without rales, wheezing or rhonchi  ABDOMEN: Soft, non-tender, non-distended MUSCULOSKELETAL:  No edema; No deformity  SKIN: Warm and dry NEUROLOGIC:  Alert and oriented x 3 PSYCHIATRIC:  Normal affect    Signed, Shirlee More, MD  10/05/2017 7:44 AM    [facility]

## 2017-10-07 DIAGNOSIS — I2510 Atherosclerotic heart disease of native coronary artery without angina pectoris: Secondary | ICD-10-CM | POA: Diagnosis not present

## 2017-10-07 DIAGNOSIS — I219 Acute myocardial infarction, unspecified: Secondary | ICD-10-CM | POA: Diagnosis not present

## 2017-10-08 ENCOUNTER — Telehealth: Payer: Self-pay

## 2017-10-08 ENCOUNTER — Other Ambulatory Visit: Payer: Self-pay

## 2017-10-08 DIAGNOSIS — R931 Abnormal findings on diagnostic imaging of heart and coronary circulation: Secondary | ICD-10-CM

## 2017-10-08 DIAGNOSIS — I25119 Atherosclerotic heart disease of native coronary artery with unspecified angina pectoris: Secondary | ICD-10-CM

## 2017-10-08 DIAGNOSIS — I5032 Chronic diastolic (congestive) heart failure: Secondary | ICD-10-CM

## 2017-10-08 NOTE — Addendum Note (Signed)
Addended by: Mattie Marlin on: 10/08/2017 11:43 AM   Modules accepted: Orders

## 2017-10-08 NOTE — Telephone Encounter (Signed)
Left voicemail informing daughter of appointment for echo at [facility] on 01/29 arrival time at 12:30.

## 2017-10-12 DIAGNOSIS — R931 Abnormal findings on diagnostic imaging of heart and coronary circulation: Secondary | ICD-10-CM | POA: Diagnosis not present

## 2017-10-12 DIAGNOSIS — I359 Nonrheumatic aortic valve disorder, unspecified: Secondary | ICD-10-CM | POA: Diagnosis not present

## 2017-10-12 DIAGNOSIS — I5032 Chronic diastolic (congestive) heart failure: Secondary | ICD-10-CM | POA: Diagnosis not present

## 2017-10-13 ENCOUNTER — Telehealth: Payer: Self-pay

## 2017-10-13 NOTE — Telephone Encounter (Signed)
Informed patient's wife of the echo results. Wife stated that their current dentist will not be performing the tooth extraction. The wife was encouraged to reach out when this was being scheduled again.

## 2017-10-14 DIAGNOSIS — D631 Anemia in chronic kidney disease: Secondary | ICD-10-CM | POA: Diagnosis not present

## 2017-10-26 DIAGNOSIS — Z6825 Body mass index (BMI) 25.0-25.9, adult: Secondary | ICD-10-CM | POA: Diagnosis not present

## 2017-10-26 DIAGNOSIS — D649 Anemia, unspecified: Secondary | ICD-10-CM | POA: Diagnosis not present

## 2017-10-26 DIAGNOSIS — I83019 Varicose veins of right lower extremity with ulcer of unspecified site: Secondary | ICD-10-CM | POA: Diagnosis not present

## 2017-10-26 DIAGNOSIS — R197 Diarrhea, unspecified: Secondary | ICD-10-CM | POA: Diagnosis not present

## 2017-10-26 DIAGNOSIS — N184 Chronic kidney disease, stage 4 (severe): Secondary | ICD-10-CM | POA: Diagnosis not present

## 2017-10-29 DIAGNOSIS — R197 Diarrhea, unspecified: Secondary | ICD-10-CM | POA: Diagnosis not present

## 2017-10-29 DIAGNOSIS — Z6824 Body mass index (BMI) 24.0-24.9, adult: Secondary | ICD-10-CM | POA: Diagnosis not present

## 2017-10-29 DIAGNOSIS — I83019 Varicose veins of right lower extremity with ulcer of unspecified site: Secondary | ICD-10-CM | POA: Diagnosis not present

## 2017-11-05 ENCOUNTER — Ambulatory Visit: Payer: PPO | Admitting: Cardiology

## 2017-11-09 DIAGNOSIS — I509 Heart failure, unspecified: Secondary | ICD-10-CM | POA: Diagnosis not present

## 2017-11-09 DIAGNOSIS — I83019 Varicose veins of right lower extremity with ulcer of unspecified site: Secondary | ICD-10-CM | POA: Diagnosis not present

## 2017-11-09 DIAGNOSIS — Z6823 Body mass index (BMI) 23.0-23.9, adult: Secondary | ICD-10-CM | POA: Diagnosis not present

## 2017-11-16 DIAGNOSIS — L89309 Pressure ulcer of unspecified buttock, unspecified stage: Secondary | ICD-10-CM | POA: Diagnosis not present

## 2017-11-16 DIAGNOSIS — I83009 Varicose veins of unspecified lower extremity with ulcer of unspecified site: Secondary | ICD-10-CM | POA: Diagnosis not present

## 2017-11-16 DIAGNOSIS — Z6823 Body mass index (BMI) 23.0-23.9, adult: Secondary | ICD-10-CM | POA: Diagnosis not present

## 2017-11-19 DIAGNOSIS — I509 Heart failure, unspecified: Secondary | ICD-10-CM | POA: Diagnosis not present

## 2017-11-19 DIAGNOSIS — Z6824 Body mass index (BMI) 24.0-24.9, adult: Secondary | ICD-10-CM | POA: Diagnosis not present

## 2017-11-19 DIAGNOSIS — N189 Chronic kidney disease, unspecified: Secondary | ICD-10-CM | POA: Diagnosis not present

## 2017-11-19 DIAGNOSIS — M109 Gout, unspecified: Secondary | ICD-10-CM | POA: Diagnosis not present

## 2017-11-19 DIAGNOSIS — I2510 Atherosclerotic heart disease of native coronary artery without angina pectoris: Secondary | ICD-10-CM | POA: Diagnosis not present

## 2017-11-19 DIAGNOSIS — K210 Gastro-esophageal reflux disease with esophagitis: Secondary | ICD-10-CM | POA: Diagnosis not present

## 2017-11-19 DIAGNOSIS — N401 Enlarged prostate with lower urinary tract symptoms: Secondary | ICD-10-CM | POA: Diagnosis not present

## 2017-11-19 DIAGNOSIS — E785 Hyperlipidemia, unspecified: Secondary | ICD-10-CM | POA: Diagnosis not present

## 2017-11-19 DIAGNOSIS — E119 Type 2 diabetes mellitus without complications: Secondary | ICD-10-CM | POA: Diagnosis not present

## 2017-11-19 DIAGNOSIS — I4891 Unspecified atrial fibrillation: Secondary | ICD-10-CM | POA: Diagnosis not present

## 2017-11-19 DIAGNOSIS — G2581 Restless legs syndrome: Secondary | ICD-10-CM | POA: Diagnosis not present

## 2017-11-24 ENCOUNTER — Emergency Department (HOSPITAL_COMMUNITY): Payer: PPO

## 2017-11-24 ENCOUNTER — Encounter (HOSPITAL_COMMUNITY): Payer: Self-pay | Admitting: *Deleted

## 2017-11-24 ENCOUNTER — Inpatient Hospital Stay (HOSPITAL_COMMUNITY)
Admission: EM | Admit: 2017-11-24 | Discharge: 2017-11-25 | DRG: 638 | Discharge status: Against Medical Advice | Payer: PPO | Attending: Internal Medicine | Admitting: Internal Medicine

## 2017-11-24 ENCOUNTER — Other Ambulatory Visit: Payer: Self-pay

## 2017-11-24 DIAGNOSIS — I5022 Chronic systolic (congestive) heart failure: Secondary | ICD-10-CM | POA: Diagnosis present

## 2017-11-24 DIAGNOSIS — I130 Hypertensive heart and chronic kidney disease with heart failure and stage 1 through stage 4 chronic kidney disease, or unspecified chronic kidney disease: Secondary | ICD-10-CM | POA: Diagnosis present

## 2017-11-24 DIAGNOSIS — M109 Gout, unspecified: Secondary | ICD-10-CM | POA: Diagnosis not present

## 2017-11-24 DIAGNOSIS — G2581 Restless legs syndrome: Secondary | ICD-10-CM | POA: Diagnosis present

## 2017-11-24 DIAGNOSIS — R739 Hyperglycemia, unspecified: Secondary | ICD-10-CM

## 2017-11-24 DIAGNOSIS — N184 Chronic kidney disease, stage 4 (severe): Secondary | ICD-10-CM | POA: Diagnosis present

## 2017-11-24 DIAGNOSIS — R197 Diarrhea, unspecified: Secondary | ICD-10-CM | POA: Diagnosis present

## 2017-11-24 DIAGNOSIS — J449 Chronic obstructive pulmonary disease, unspecified: Secondary | ICD-10-CM | POA: Diagnosis not present

## 2017-11-24 DIAGNOSIS — I2510 Atherosclerotic heart disease of native coronary artery without angina pectoris: Secondary | ICD-10-CM | POA: Diagnosis not present

## 2017-11-24 DIAGNOSIS — I482 Chronic atrial fibrillation, unspecified: Secondary | ICD-10-CM | POA: Diagnosis present

## 2017-11-24 DIAGNOSIS — I4891 Unspecified atrial fibrillation: Secondary | ICD-10-CM | POA: Diagnosis not present

## 2017-11-24 DIAGNOSIS — E1100 Type 2 diabetes mellitus with hyperosmolarity without nonketotic hyperglycemic-hyperosmolar coma (NKHHC): Secondary | ICD-10-CM

## 2017-11-24 DIAGNOSIS — E1122 Type 2 diabetes mellitus with diabetic chronic kidney disease: Secondary | ICD-10-CM | POA: Diagnosis present

## 2017-11-24 DIAGNOSIS — E871 Hypo-osmolality and hyponatremia: Secondary | ICD-10-CM | POA: Diagnosis not present

## 2017-11-24 DIAGNOSIS — Z951 Presence of aortocoronary bypass graft: Secondary | ICD-10-CM

## 2017-11-24 DIAGNOSIS — E860 Dehydration: Secondary | ICD-10-CM | POA: Diagnosis not present

## 2017-11-24 DIAGNOSIS — E785 Hyperlipidemia, unspecified: Secondary | ICD-10-CM | POA: Diagnosis not present

## 2017-11-24 DIAGNOSIS — E1101 Type 2 diabetes mellitus with hyperosmolarity with coma: Secondary | ICD-10-CM

## 2017-11-24 DIAGNOSIS — N401 Enlarged prostate with lower urinary tract symptoms: Secondary | ICD-10-CM | POA: Diagnosis not present

## 2017-11-24 DIAGNOSIS — N400 Enlarged prostate without lower urinary tract symptoms: Secondary | ICD-10-CM | POA: Diagnosis not present

## 2017-11-24 DIAGNOSIS — Z794 Long term (current) use of insulin: Secondary | ICD-10-CM

## 2017-11-24 DIAGNOSIS — K219 Gastro-esophageal reflux disease without esophagitis: Secondary | ICD-10-CM | POA: Diagnosis present

## 2017-11-24 DIAGNOSIS — E1165 Type 2 diabetes mellitus with hyperglycemia: Secondary | ICD-10-CM | POA: Diagnosis not present

## 2017-11-24 DIAGNOSIS — I83019 Varicose veins of right lower extremity with ulcer of unspecified site: Secondary | ICD-10-CM | POA: Diagnosis not present

## 2017-11-24 DIAGNOSIS — I10 Essential (primary) hypertension: Secondary | ICD-10-CM | POA: Diagnosis not present

## 2017-11-24 DIAGNOSIS — N189 Chronic kidney disease, unspecified: Secondary | ICD-10-CM | POA: Diagnosis not present

## 2017-11-24 DIAGNOSIS — I509 Heart failure, unspecified: Secondary | ICD-10-CM | POA: Diagnosis not present

## 2017-11-24 DIAGNOSIS — D631 Anemia in chronic kidney disease: Secondary | ICD-10-CM | POA: Diagnosis present

## 2017-11-24 DIAGNOSIS — F418 Other specified anxiety disorders: Secondary | ICD-10-CM | POA: Diagnosis present

## 2017-11-24 DIAGNOSIS — Z7982 Long term (current) use of aspirin: Secondary | ICD-10-CM

## 2017-11-24 DIAGNOSIS — I340 Nonrheumatic mitral (valve) insufficiency: Secondary | ICD-10-CM | POA: Diagnosis not present

## 2017-11-24 DIAGNOSIS — Z9981 Dependence on supplemental oxygen: Secondary | ICD-10-CM

## 2017-11-24 DIAGNOSIS — Z7902 Long term (current) use of antithrombotics/antiplatelets: Secondary | ICD-10-CM

## 2017-11-24 DIAGNOSIS — Z87891 Personal history of nicotine dependence: Secondary | ICD-10-CM

## 2017-11-24 DIAGNOSIS — Z955 Presence of coronary angioplasty implant and graft: Secondary | ICD-10-CM

## 2017-11-24 DIAGNOSIS — I252 Old myocardial infarction: Secondary | ICD-10-CM

## 2017-11-24 DIAGNOSIS — Z7901 Long term (current) use of anticoagulants: Secondary | ICD-10-CM

## 2017-11-24 DIAGNOSIS — K210 Gastro-esophageal reflux disease with esophagitis: Secondary | ICD-10-CM | POA: Diagnosis not present

## 2017-11-24 HISTORY — DX: Type 2 diabetes mellitus with hyperosmolarity without nonketotic hyperglycemic-hyperosmolar coma (NKHHC): E11.00

## 2017-11-24 LAB — BASIC METABOLIC PANEL
ANION GAP: 12 (ref 5–15)
BUN: 61 mg/dL — ABNORMAL HIGH (ref 6–20)
CALCIUM: 8.3 mg/dL — AB (ref 8.9–10.3)
CO2: 20 mmol/L — AB (ref 22–32)
Chloride: 86 mmol/L — ABNORMAL LOW (ref 101–111)
Creatinine, Ser: 3.3 mg/dL — ABNORMAL HIGH (ref 0.61–1.24)
GFR calc Af Amer: 19 mL/min — ABNORMAL LOW (ref >60)
GFR calc non Af Amer: 17 mL/min — ABNORMAL LOW (ref >60)
Glucose, Bld: 722 mg/dL (ref 65–99)
Potassium: 3.8 mmol/L (ref 3.5–5.1)
Sodium: 118 mmol/L — CL (ref 135–145)

## 2017-11-24 LAB — CBC WITH DIFFERENTIAL/PLATELET
Basophils Absolute: 0 10*3/uL (ref 0.0–0.1)
Basophils Relative: 0 %
EOS PCT: 8 %
Eosinophils Absolute: 0.7 10*3/uL (ref 0.0–0.7)
HCT: 28.8 % — ABNORMAL LOW (ref 39.0–52.0)
HEMOGLOBIN: 10.2 g/dL — AB (ref 13.0–17.0)
LYMPHS ABS: 1.2 10*3/uL (ref 0.7–4.0)
LYMPHS PCT: 15 %
MCH: 31.5 pg (ref 26.0–34.0)
MCHC: 35.4 g/dL (ref 30.0–36.0)
MCV: 88.9 fL (ref 78.0–100.0)
Monocytes Absolute: 0.3 10*3/uL (ref 0.1–1.0)
Monocytes Relative: 4 %
Neutro Abs: 6 10*3/uL (ref 1.7–7.7)
Neutrophils Relative %: 73 %
PLATELETS: 249 10*3/uL (ref 150–400)
RBC: 3.24 MIL/uL — AB (ref 4.22–5.81)
RDW: 13.7 % (ref 11.5–15.5)
WBC: 8.2 10*3/uL (ref 4.0–10.5)

## 2017-11-24 LAB — URINALYSIS, ROUTINE W REFLEX MICROSCOPIC
Bacteria, UA: NONE SEEN
Bilirubin Urine: NEGATIVE
HGB URINE DIPSTICK: NEGATIVE
Ketones, ur: NEGATIVE mg/dL
Leukocytes, UA: NEGATIVE
Nitrite: NEGATIVE
Protein, ur: 100 mg/dL — AB
Specific Gravity, Urine: 1.011 (ref 1.005–1.030)
pH: 8 (ref 5.0–8.0)

## 2017-11-24 LAB — COMPREHENSIVE METABOLIC PANEL
ALK PHOS: 76 U/L (ref 38–126)
ALT: 12 U/L — AB (ref 17–63)
AST: 14 U/L — ABNORMAL LOW (ref 15–41)
Albumin: 3.2 g/dL — ABNORMAL LOW (ref 3.5–5.0)
Anion gap: 13 (ref 5–15)
BUN: 62 mg/dL — ABNORMAL HIGH (ref 6–20)
CALCIUM: 8.7 mg/dL — AB (ref 8.9–10.3)
CO2: 21 mmol/L — ABNORMAL LOW (ref 22–32)
CREATININE: 3.5 mg/dL — AB (ref 0.61–1.24)
Chloride: 81 mmol/L — ABNORMAL LOW (ref 101–111)
GFR, EST AFRICAN AMERICAN: 18 mL/min — AB (ref >60)
GFR, EST NON AFRICAN AMERICAN: 15 mL/min — AB (ref >60)
Glucose, Bld: 891 mg/dL (ref 65–99)
Potassium: 4.3 mmol/L (ref 3.5–5.1)
Sodium: 115 mmol/L — CL (ref 135–145)
TOTAL PROTEIN: 5.8 g/dL — AB (ref 6.5–8.1)
Total Bilirubin: 0.9 mg/dL (ref 0.3–1.2)

## 2017-11-24 LAB — CBG MONITORING, ED
GLUCOSE-CAPILLARY: 478 mg/dL — AB (ref 65–99)
Glucose-Capillary: >600 mg/dL (ref 65–99)
Glucose-Capillary: >600 mg/dL (ref 65–99)

## 2017-11-24 LAB — I-STAT CG4 LACTIC ACID, ED: LACTIC ACID, VENOUS: 1.59 mmol/L (ref 0.5–1.9)

## 2017-11-24 MED ORDER — HYDRALAZINE HCL 25 MG PO TABS
50.0000 mg | ORAL_TABLET | Freq: Two times a day (BID) | ORAL | Status: DC
  Administered 2017-11-24: 50 mg via ORAL
  Filled 2017-11-24: qty 1

## 2017-11-24 MED ORDER — NITROGLYCERIN 0.4 MG SL SUBL: 0.4000 mg | SUBLINGUAL_TABLET | SUBLINGUAL | Status: DC | PRN

## 2017-11-24 MED ORDER — CLOPIDOGREL BISULFATE 75 MG PO TABS
75.0000 mg | ORAL_TABLET | Freq: Every evening | ORAL | Status: DC
  Administered 2017-11-24: 75 mg via ORAL
  Filled 2017-11-24: qty 1

## 2017-11-24 MED ORDER — ALLOPURINOL 100 MG PO TABS
100.0000 mg | ORAL_TABLET | Freq: Every evening | ORAL | Status: DC
  Administered 2017-11-24: 100 mg via ORAL
  Filled 2017-11-24: qty 1

## 2017-11-24 MED ORDER — ONDANSETRON HCL 4 MG/2ML IJ SOLN
4.0000 mg | Freq: Four times a day (QID) | INTRAMUSCULAR | Status: DC | PRN
Start: 2017-11-24 — End: 2017-11-25

## 2017-11-24 MED ORDER — HYDROXYZINE HCL 25 MG PO TABS: 25.0000 mg | ORAL_TABLET | Freq: Three times a day (TID) | ORAL | Status: DC | PRN

## 2017-11-24 MED ORDER — OXYCODONE HCL 5 MG PO TABS: 5.0000 mg | ORAL_TABLET | Freq: Four times a day (QID) | ORAL | Status: DC | PRN

## 2017-11-24 MED ORDER — TAMSULOSIN HCL 0.4 MG PO CAPS
0.4000 mg | ORAL_CAPSULE | Freq: Every day | ORAL | Status: DC
  Administered 2017-11-24: 0.4 mg via ORAL
  Filled 2017-11-24: qty 1

## 2017-11-24 MED ORDER — FERROUS SULFATE 325 (65 FE) MG PO TABS
325.0000 mg | ORAL_TABLET | Freq: Every day | ORAL | Status: DC
  Administered 2017-11-25: 325 mg via ORAL
  Filled 2017-11-24: qty 1

## 2017-11-24 MED ORDER — ISOSORBIDE MONONITRATE ER 30 MG PO TB24: 90.0000 mg | ORAL_TABLET | Freq: Every day | ORAL | Status: DC

## 2017-11-24 MED ORDER — RANOLAZINE ER 500 MG PO TB12
500.0000 mg | ORAL_TABLET | Freq: Two times a day (BID) | ORAL | Status: DC
  Administered 2017-11-24: 500 mg via ORAL
  Filled 2017-11-24 (×2): qty 1

## 2017-11-24 MED ORDER — AMLODIPINE BESYLATE 5 MG PO TABS
5.0000 mg | ORAL_TABLET | Freq: Every evening | ORAL | Status: DC
  Administered 2017-11-24: 5 mg via ORAL
  Filled 2017-11-24: qty 1

## 2017-11-24 MED ORDER — CARVEDILOL 12.5 MG PO TABS
25.0000 mg | ORAL_TABLET | Freq: Two times a day (BID) | ORAL | Status: DC
  Administered 2017-11-25: 25 mg via ORAL
  Filled 2017-11-24: qty 2

## 2017-11-24 MED ORDER — INSULIN REGULAR BOLUS VIA INFUSION
0.0000 [IU] | Freq: Three times a day (TID) | INTRAVENOUS | Status: DC
  Filled 2017-11-24: qty 10

## 2017-11-24 MED ORDER — ROPINIROLE HCL 1 MG PO TABS
1.0000 mg | ORAL_TABLET | Freq: Two times a day (BID) | ORAL | Status: DC
  Administered 2017-11-24: 1 mg via ORAL
  Filled 2017-11-24 (×2): qty 1

## 2017-11-24 MED ORDER — ONDANSETRON HCL 4 MG PO TABS: 4.0000 mg | ORAL_TABLET | Freq: Four times a day (QID) | ORAL | Status: DC | PRN

## 2017-11-24 MED ORDER — LORATADINE 10 MG PO TABS: 10.0000 mg | ORAL_TABLET | Freq: Every day | ORAL | Status: DC

## 2017-11-24 MED ORDER — ROSUVASTATIN CALCIUM 5 MG PO TABS
5.0000 mg | ORAL_TABLET | Freq: Every day | ORAL | Status: DC
  Administered 2017-11-24: 5 mg via ORAL
  Filled 2017-11-24 (×2): qty 1

## 2017-11-24 MED ORDER — SODIUM CHLORIDE 0.9 % IV SOLN
INTRAVENOUS | Status: DC
  Administered 2017-11-24: 20:00:00 via INTRAVENOUS

## 2017-11-24 MED ORDER — DEXTROSE-NACL 5-0.45 % IV SOLN
INTRAVENOUS | Status: DC
  Administered 2017-11-25: via INTRAVENOUS

## 2017-11-24 MED ORDER — SODIUM CHLORIDE 0.9 % IV SOLN
INTRAVENOUS | Status: DC
  Administered 2017-11-24: 5.4 [IU]/h via INTRAVENOUS
  Filled 2017-11-24: qty 1

## 2017-11-24 MED ORDER — DEXTROSE 50 % IV SOLN: 25.0000 mL | INTRAVENOUS | Status: DC | PRN

## 2017-11-24 MED ORDER — APIXABAN 2.5 MG PO TABS
2.5000 mg | ORAL_TABLET | Freq: Two times a day (BID) | ORAL | Status: DC
  Administered 2017-11-24: 2.5 mg via ORAL
  Filled 2017-11-24 (×2): qty 1

## 2017-11-24 MED ORDER — ACETAMINOPHEN 650 MG RE SUPP: 650.0000 mg | Freq: Four times a day (QID) | RECTAL | Status: DC | PRN

## 2017-11-24 MED ORDER — IPRATROPIUM-ALBUTEROL 0.5-2.5 (3) MG/3ML IN SOLN: 3.0000 mL | RESPIRATORY_TRACT | Status: DC | PRN

## 2017-11-24 MED ORDER — SODIUM CHLORIDE 0.9 % IV SOLN
INTRAVENOUS | Status: DC
  Administered 2017-11-25: via INTRAVENOUS

## 2017-11-24 MED ORDER — SODIUM CHLORIDE 0.9 % IV BOLUS (SEPSIS)
500.0000 mL | Freq: Once | INTRAVENOUS | Status: AC
  Administered 2017-11-24: 500 mL via INTRAVENOUS

## 2017-11-24 MED ORDER — INSULIN ASPART 100 UNIT/ML ~~LOC~~ SOLN: 15.0000 [IU] | Freq: Once | SUBCUTANEOUS | Status: DC

## 2017-11-24 MED ORDER — PANTOPRAZOLE SODIUM 40 MG PO TBEC
40.0000 mg | DELAYED_RELEASE_TABLET | Freq: Every day | ORAL | Status: DC
  Administered 2017-11-24: 40 mg via ORAL
  Filled 2017-11-24: qty 1

## 2017-11-24 MED ORDER — ACETAMINOPHEN 325 MG PO TABS: 650.0000 mg | ORAL_TABLET | Freq: Four times a day (QID) | ORAL | Status: DC | PRN

## 2017-11-24 MED ORDER — INSULIN NPH (HUMAN) (ISOPHANE) 100 UNIT/ML ~~LOC~~ SUSP
30.0000 [IU] | Freq: Two times a day (BID) | SUBCUTANEOUS | Status: DC
  Filled 2017-11-24: qty 10

## 2017-11-24 MED ORDER — ASPIRIN EC 81 MG PO TBEC
81.0000 mg | DELAYED_RELEASE_TABLET | Freq: Every evening | ORAL | Status: DC
  Administered 2017-11-24: 81 mg via ORAL
  Filled 2017-11-24: qty 1

## 2017-11-24 NOTE — ED Provider Notes (Signed)
Patient placed in Quick Look pathway, seen and evaluated   Chief Complaint: Diarrhea, dehydration  HPI:   Patient presents with 1 week history of watery nonbloody stools.  Wife states that he had at least 13 episodes last night.  He was seen and evaluated by his primary care physician earlier today and prescribed Cipro and Flagyl.  They attempted to obtain blood work but were unable to and recommended the patient present to the ED for evaluation of dehydration.  Of note, patient had similar history of diarrhea 3 weeks ago for which he was placed on antibiotics with improvement.  He denies fevers, nausea, vomiting, abdominal pain.  No chest pain or shortness of breath.  He has lower extremity edema which is chronic and unchanged for him.  ROS: Positive for diarrhea negative for abdominal pain, nausea, vomiting, fevers, chest pain, shortness of breath, syncope  Physical Exam:   Gen: No distress  Neuro: Awake and Alert  Skin: Warm    Focused Exam: Lungs are clear to auscultation bilaterally.  He has 2+ abdomen is soft and nontender with active bowel sounds in all 4 quadrants.  No CVA tenderness.  Pitting edema of the bilateral lower extremities which he states is chronic and unchanged.  He has a bandage overlying the right lower extremity for a wound which she states is being followed by his primary care physician and is improving.   Initiation of care has begun. The patient has been counseled on the process, plan, and necessity for staying for the completion/evaluation, and the remainder of the medical screening examination    Renita Papa, PA-C 11/24/17 1407    Isla Pence, MD 11/24/17 1627

## 2017-11-24 NOTE — ED Notes (Signed)
Patient arrived to room with wife and daughter at bedside. Patient reports to have had diarrhea for about 3 weeks. He was given antibiotics by PCP which resolved it but he reports that diarrhea started back on Friday.

## 2017-11-24 NOTE — H&P (Addendum)
History and Physical    [name] CURRENT XVQ:[id number] DOB: 11-30-1940 DOA: 11/24/2017  Referring MD/NP/PA: Dr. Daleen Bo PCP: Ocie Doyne., MD  Patient coming from: Home  Chief Complaint: Diarrhea  I have personally briefly reviewed patient's old medical records in [place]   HPI: Kyle [name] is a 77 y.o. male with medical history significant of HTN, HLD, CAD s/p CABG, sCHF last EF 40% with moderate mitral regurgitation on 10/12/17, A. fib on Eliquis, COPD, and DM type II( uncontrolled); presents with complaints of diarrhea for the last 4 days.  Reports having multiple nonbloody small liquid stools per hour at home.  Associated symptoms of lightheadedness, poor appetite, lower extremity swelling, general weakness, weight loss of approximately 10 more pounds in the last month, and urinary frequency which is reported as chronic.Marland Kitchen He had a similar episode with diarrhea 2-3 weeks ago for which he was treated with a course of ciprofloxacin and Flagyl by his primary care provider with improvement of symptoms.  Family reports that stool studies were negative for infection.  Patient denies having any significant abdominal pain, nausea, vomiting, fever, cough, shortness of breath, loss of consciousness, or falls.  He reports that his blood sugars are well controlled, but notes that over the last 2 days he is not taking any of his insulin.  Patient does have a wound that he sustained 2-3 weeks ago to the right lower leg that he is being attended to by his primary care provider.  Patient reports there is no need to have it evaluated.  He had gone to his primary care provider this morning and was thought to be significantly dehydrated with elevated blood sugars for which she was directed to the emergency department for further evaluation.   ED Course: Upon admission into the emergency department patient was noted to be afebrile with vital signs relatively within normal limits.  Labs revealed WBC  8.2, hemoglobin 10.2, platelets 249, sodium 115, potassium 4.3, BUN 62, creatinine 3.5, glucose 891, anion gap 13, and lactic acid 1.59.  Chest x-ray showed no acute abnormalities.  Patient was given 500 mL of normal saline, and then placed on a rate of 125 mL/h.  TRH called to admit.  Review of Systems  Constitutional: Positive for weight loss. Negative for chills and fever.  HENT: Negative for congestion and sinus pain.   Eyes: Negative for photophobia and pain.  Respiratory: Negative for cough and shortness of breath.   Cardiovascular: Positive for leg swelling. Negative for chest pain.  Gastrointestinal: Positive for diarrhea. Negative for abdominal pain, blood in stool, nausea and vomiting.  Genitourinary: Positive for frequency (Chronic). Negative for dysuria.  Musculoskeletal: Negative for falls and joint pain.  Skin: Negative for rash.       Positive for right lower extremity wound  Neurological: Positive for weakness. Negative for sensory change, focal weakness and loss of consciousness.  Endo/Heme/Allergies: Bruises/bleeds easily.  Psychiatric/Behavioral: Negative for substance abuse. The patient is not nervous/anxious.     Past Medical History:  Diagnosis Date  . Acute on chronic diastolic CHF (congestive heart failure) ([place]) 07/07/2016  . Acute respiratory failure with hypoxia ([place]) 03/19/2016  . AKI (acute kidney injury) ([place])   . Anemia   . Arthritis    "I'm eat up w/it" (07/08/2016)  . Atrial fibrillation ([place])   . BPH (benign prostatic hyperplasia)   . CAD (coronary artery disease)   . CAP (community acquired pneumonia) 03/19/2016  . Chronic anticoagulation 01/22/2016  .  Chronic diastolic heart failure ([place]) 01/22/2016   Overview:  3 recent admissions with decompensated heart failure  . Chronic kidney disease, stage IV (severe) ([place])   . Chronic systolic (congestive) heart failure ([place])   . CKD (chronic kidney disease), stage IV ([place])   . Coronary artery disease  involving native heart with angina pectoris [facility])    Overview:  coronary angiography July 2017 showed occlusion of his native coronary arteries and vein grafts and he is perfused by left thoracic artery judged to be best treated medically.  . Depression with anxiety   . DM due to underlying condition with diabetic chronic kidney disease ([place])    Type 2  . Essential hypertension   . Fluid overload 03/19/2016  . GERD (gastroesophageal reflux disease)   . GERD (gastroesophageal reflux disease)   . Gout   . H/O coronary artery bypass surgery 03/23/2012  . Headache   . Heart failure ([place]) 03/24/2012  . HLD (hyperlipidemia)   . Hyperlipidemia   . Hypertensive heart disease with heart failure ([place]) 01/22/2016  . Myocardial infarction ([place])    "I've had several light heart attacks over this past year; might have had one this time" (07/08/2016)  . NSTEMI (non-ST elevated myocardial infarction) ([place]) 03/19/2016  . On home oxygen therapy    "2L prn" (07/08/2016)  . Pneumonia   . PONV (postoperative nausea and vomiting)   . Restless leg syndrome   . Shortness of breath   . Shortness of breath dyspnea   . Status post coronary artery stent placement 03/23/2012  . Uncontrolled type 2 diabetes mellitus with complication [facility])     Past Surgical History:  Procedure Laterality Date  . AV FISTULA PLACEMENT Right 07/13/2016   Procedure: ARTERIOVENOUS (AV) FISTULA CREATION RIGHT LOWER ARM;  Surgeon: [name], MD;  Location: [facility];  Service: Vascular;  Laterality: Right;  . BASCILIC VEIN TRANSPOSITION Right 02/17/2017   Procedure: BASILIC VEIN TRANSPOSITION RIGHT ARM;  Surgeon: [name], MD;  Location: [facility];  Service: Vascular;  Laterality: Right;  . CARDIAC CATHETERIZATION N/A 03/24/2016   Procedure: Right/Left Heart Cath and Coronary/Graft Angiography;  Surgeon: Troy Sine, MD;  Location: [facility] CV [facility];  Service: Cardiovascular;  Laterality: N/A;  . CATARACT EXTRACTION W/ INTRAOCULAR LENS   IMPLANT, BILATERAL Bilateral   . COLONOSCOPY    . CORONARY ANGIOPLASTY WITH STENT PLACEMENT     "I've had a few; some w/stents" (07/08/2016)  . CORONARY ARTERY BYPASS GRAFT  ?1995   CABG X"4"     reports that he quit smoking about 49 years ago. His smoking use included cigarettes. He has a 24.00 pack-year smoking history. he has never used smokeless tobacco. He reports that he drinks alcohol. He reports that he does not use drugs.  Allergies  Allergen Reactions  . No Known Allergies     Family History  Problem Relation Age of Onset  . Heart disease Mother   . CAD Mother   . Heart disease Father   . Diabetes Father   . Stroke Father   . Lung cancer Brother   . Diabetes Brother   . Diabetes Sister     Prior to Admission medications   Medication Sig Start Date End Date Taking? Authorizing Provider  allopurinol (ZYLOPRIM) 100 MG tablet Take 100 mg by mouth every evening.    Yes [provider]  amLODipine (NORVASC) 5 MG tablet Take 5 mg by mouth every evening.   Yes [provider]  apixaban (ELIQUIS) 2.5 MG TABS tablet Take 1 tablet (2.5 mg total) by mouth 2 (two) times daily. 07/14/16  Yes Alvia Grove, PA-C  aspirin EC 81 MG tablet Take 81 mg by mouth every evening.    Yes [provider]  Calcium Carb-Cholecalciferol (CALTRATE 600+D3 SOFT PO) Take 1 tablet by mouth every evening.    Yes [provider]  carvedilol (COREG) 25 MG tablet Take 25 mg by mouth 2 (two) times daily with a meal.   Yes [provider]  clopidogrel (PLAVIX) 75 MG tablet Take 75 mg by mouth every evening.    Yes [provider]  cyanocobalamin (,VITAMIN B-12,) 1000 MCG/ML injection Inject 1,000 mcg into the muscle every 30 (thirty) days.   Yes [provider]  ferrous sulfate 325 (65 FE) MG tablet Take 325 mg by mouth daily with breakfast.   Yes [provider]  fexofenadine (ALLEGRA) 180 MG tablet Take 180 mg by mouth daily.  11/09/17  Yes [provider]  hydrALAZINE (APRESOLINE) 50 MG tablet Take 50 mg by mouth 2 (two) times daily.    Yes [provider]  hydrOXYzine (ATARAX/VISTARIL) 25 MG tablet Take 25 mg by mouth 3 (three) times daily as needed.   Yes [provider]  insulin aspart (NOVOLOG) 100 UNIT/ML injection Inject 10-15 Units into the skin 3 (three) times daily as needed for high blood sugar. Per sliding scale    Yes [provider]  insulin NPH Human (NOVOLIN N) 100 UNIT/ML injection Inject 30 Units into the skin 2 (two) times daily.    Yes [provider]  isosorbide mononitrate (IMDUR) 30 MG 24 hr tablet Take 90 mg by mouth daily at 6 (six) AM.   Yes [provider]  nitroGLYCERIN (NITROSTAT) 0.4 MG SL tablet Place 0.4 mg under the tongue every 5 (five) minutes as needed for chest pain.   Yes [provider]  omeprazole (PRILOSEC) 20 MG capsule Take 20 mg by mouth daily.   Yes [provider]  Oxycodone HCl 10 MG TABS Take 0.5 tablets (5 mg total) by mouth every 6 (six) hours as needed (pain). 02/17/17  Yes Rhyne, Hulen Shouts, PA-C  Probiotic Product (PROBIOTIC MULTI-ENZYME PO) Take 1 tablet by mouth daily.   Yes [provider]  ranolazine (RANEXA) 500 MG 12 hr tablet Take 1 tablet (500 mg total) by mouth 2 (two) times daily. 03/26/16  Yes Theodis Blaze, MD  rOPINIRole (REQUIP) 1 MG tablet Take 1 mg by mouth 2 (two) times daily.    Yes [provider]  rosuvastatin (CRESTOR) 5 MG tablet Take 5 mg by mouth daily. 08/25/17  Yes [provider]  tamsulosin (FLOMAX) 0.4 MG CAPS capsule Take 0.4 mg by mouth at bedtime.    Yes [provider]  torsemide (DEMADEX) 100 MG tablet Take 1 tablet (100 mg total) by mouth daily. 07/10/16  Yes Cherene Altes, MD    Physical Exam:  Constitutional: Elderly male in NAD, calm, comfortable Vitals:   11/24/17 1351 11/24/17 1906  BP: (!) 140/44 (!) 153/91  Pulse:   73  Resp: 16 15  Temp: 98.2 F (36.8 C)   TempSrc: Oral   SpO2: 100% 99%  Weight: 76.2 kg (168 lb)   Height: 5\' 8"  (1.727 m)    Eyes: PERRL, lids and conjunctivae normal ENMT: Mucous membranes are dry. Posterior pharynx clear of any exudate or lesions.Normal dentition.  Neck: normal, supple, no masses, no thyromegaly Respiratory:  clear to auscultation bilaterally, no wheezing, no crackles. Normal respiratory effort. No accessory muscle use.  Cardiovascular: Irregular irregular, no murmurs / rubs / gallops.  1+ pitting lower extremity edema. 2+ pedal pulses. No carotid bruits.  Abdomen: no tenderness, no masses palpated. No hepatosplenomegaly. Bowel sounds positive.  Musculoskeletal: no clubbing / cyanosis. No joint deformity upper and lower extremities. Good ROM, no contractures. Normal muscle tone.  Skin: Wound present of the right lower extremity that is wrapped.  Patient declines unwrapping. Neurologic: CN 2-12 grossly intact. Sensation intact, DTR normal. Strength 5/5 in all 4.  Psychiatric: Normal judgment and insight. Alert and oriented x 3. Normal mood.     Labs on Admission: I have personally reviewed following labs and imaging studies  CBC: Recent Labs  Lab 11/24/17 1422  WBC 8.2  NEUTROABS 6.0  HGB 10.2*  HCT 28.8*  MCV 88.9  PLT 751   Basic Metabolic Panel: Recent Labs  Lab 11/24/17 1422  NA 115*  K 4.3  CL 81*  CO2 21*  GLUCOSE 891*  BUN 62*  CREATININE 3.50*  CALCIUM 8.7*   GFR: Estimated Creatinine Clearance: 16.8 mL/min (A) (by C-G formula based on SCr of 3.5 mg/dL (H)). Liver Function Tests: Recent Labs  Lab 11/24/17 1422  AST 14*  ALT 12*  ALKPHOS 76  BILITOT 0.9  PROT 5.8*  ALBUMIN 3.2*   No results for input(s): LIPASE, AMYLASE in the last 168 hours. No results for input(s): AMMONIA in the last 168 hours. Coagulation Profile: No results for input(s): INR, PROTIME in the last 168 hours. Cardiac Enzymes: No results for input(s):  CKTOTAL, CKMB, CKMBINDEX, TROPONINI in the last 168 hours. BNP (last 3 results) No results for input(s): PROBNP in the last 8760 hours. HbA1C: No results for input(s): HGBA1C in the last 72 hours. CBG: No results for input(s): GLUCAP in the last 168 hours. Lipid Profile: No results for input(s): CHOL, HDL, LDLCALC, TRIG, CHOLHDL, LDLDIRECT in the last 72 hours. Thyroid Function Tests: No results for input(s): TSH, T4TOTAL, FREET4, T3FREE, THYROIDAB in the last 72 hours. Anemia Panel: No results for input(s): VITAMINB12, FOLATE, FERRITIN, TIBC, IRON, RETICCTPCT in the last 72 hours. Urine analysis:    Component Value Date/Time   COLORURINE YELLOW 07/08/2016 1942   APPEARANCEUR CLOUDY (A) 07/08/2016 1942   LABSPEC 1.014 07/08/2016 1942   PHURINE 5.5 07/08/2016 1942   GLUCOSEU NEGATIVE 07/08/2016 1942   HGBUR SMALL (A) 07/08/2016 1942   BILIRUBINUR NEGATIVE 07/08/2016 1942   KETONESUR NEGATIVE 07/08/2016 1942   PROTEINUR 100 (A) 07/08/2016 1942   NITRITE NEGATIVE 07/08/2016 1942   LEUKOCYTESUR NEGATIVE 07/08/2016 1942   Sepsis Labs: No results found for this or any previous visit (from the past 240 hour(s)).   Radiological Exams on Admission: Dg Chest 2 View  Result Date: 11/24/2017 CLINICAL DATA:  Diarrhea. EXAM: CHEST - 2 VIEW COMPARISON:  None. FINDINGS: Sternotomy wires overlie normal cardiac silhouette. No effusion, infiltrate or pneumothorax. No acute osseous abnormality. IMPRESSION: No acute cardiopulmonary process. Electronically Signed   By: Suzy Bouchard M.D.   On: 11/24/2017 16:20    EKG: Independently reviewed.  Atrial fibrillation with PVC  Assessment/Plan Hyperosmolar hyperglycemic nonketotic syndrome: Acute.  Patient presents with blood sugar of 891 on admission without anion gap.  Last hemoglobin A1c noted to be 10.4 back in 03/2016.  Significant hyponatremia noted but when corrected for glucose is more within her normal range. - Admit to stepdown - Check  urinalysis - Check hemoglobin A1c in  a.m. - Start glucose stabilizer protocol - Slow rehydration with normal saline IV fluids at 100 mL/h given CHF history - BMPs every 6 hours x3 - will monitor and correct for electrolyte abnormalities - Diabetic education consult  Diarrhea: Patient reports having recurrence of diarrhea previously treated with ciprofloxacin and metronidazole 3 weeks ago, but noted to be noninfectious.  Patient not noted to have had stool since prior to arrival. - Check stool C. difficile and GI panel  Systolic congestive heart failure: Chronic.  Patient appears to be hypovolemic at this time.  On physical exam patient has mild 1+ pitting edema.  Last echocardiogram showed EF 40% with restricted diastolic relaxation and moderate mitral regurgitation on 10/12/17. - Strict I&O's - Daily weights - Continue isosorbide mononitrate  Chronic kidney disease stage IV: Patient presents with a creatinine of 3.5 with BUN of 62.  Elevated BUN suggests dehydration related to diarrhea. - Recheck BMP in am  Essential hypertension - Continue Coreg, hydralazine, amlodipine - Held torsemide, restart when medically approach  Chronic atrial fibrillation on chronic anticoagulation: Stable - Continue Eliquis   Coronary artery disease: Patient status post CABG.- Continue Ranexa, Plavix, and aspirin  Anemia chronic kidney disease: Hemoglobin 10.2 on admission and patient reports no history of bleeding. - Continue ferrous sulfate  GERD - Continue pharmacy substitution of Protonix  DVT prophylaxis: Eliquis Code Status: full Family Communication: Discussed plan of care with the patient and family present at bedside Disposition Plan: Likely discharge home once medically stable Consults called: None Admission status: Inpatient  Norval Morton MD [facility] Pager [phone]   If 7PM-7AM, please contact night-coverage [email or web address] Password [email or web address]  11/24/2017, 8:00 PM

## 2017-11-24 NOTE — ED Notes (Signed)
Dr tegeler notified of critical labs

## 2017-11-24 NOTE — ED Provider Notes (Signed)
[facility] EMERGENCY DEPARTMENT Provider Note   CSN: [id number] Arrival date & time: 11/24/17  1322     History   Chief Complaint Chief Complaint  Patient presents with  . Diarrhea    HPI Kyle [name] is a 77 y.o. male.  He presents for evaluation of diarrhea, recurrent, which is been present for 7 days.  Previously he had diarrhea 3 weeks ago, and at that time was treated with antibiotic medication.  He saw his PCP today who was concerned for dehydration so sent him here.  The patient is a somewhat poor historian and defers answers to family members.  Wife and daughter, state that he has not been able to eat much for 3 weeks.  His diarrhea has been "constant," and occasionally has some form to it.  It tends to look like "brown water."  He has been intermittently getting around the house in a wheelchair and using a cane to walk.  He has a chronic wound on his right lower leg, which is being treated with dressing changes once a week.  The dressing was removed today, and a wound culture was taken, and then rewrapped.  Since the wrap has been placed on his right lower leg, he has decreased swelling in it.  He has chronic left lower leg swelling.  There is been no recent cough, fever, focal weakness or paresthesia.  Family members are concerned that the patient might have "sepsis," because that was mentioned by the PCP, today along with a concern for dehydration.  He apparently took Imodium this morning for diarrhea.  There are no other known modifying factors.  HPI  Past Medical History:  Diagnosis Date  . Acute on chronic diastolic CHF (congestive heart failure) ([place]) 07/07/2016  . Acute respiratory failure with hypoxia ([place]) 03/19/2016  . AKI (acute kidney injury) ([place])   . Anemia   . Arthritis    "I'm eat up w/it" (07/08/2016)  . Atrial fibrillation ([place])   . BPH (benign prostatic hyperplasia)   . CAD (coronary artery disease)   . CAP (community acquired  pneumonia) 03/19/2016  . Chronic anticoagulation 01/22/2016  . Chronic diastolic heart failure ([place]) 01/22/2016   Overview:  3 recent admissions with decompensated heart failure  . Chronic kidney disease, stage IV (severe) ([place])   . Chronic systolic (congestive) heart failure ([place])   . CKD (chronic kidney disease), stage IV ([place])   . Coronary artery disease involving native heart with angina pectoris [facility])    Overview:  coronary angiography July 2017 showed occlusion of his native coronary arteries and vein grafts and he is perfused by left thoracic artery judged to be best treated medically.  . Depression with anxiety   . DM due to underlying condition with diabetic chronic kidney disease ([place])    Type 2  . Essential hypertension   . Fluid overload 03/19/2016  . GERD (gastroesophageal reflux disease)   . GERD (gastroesophageal reflux disease)   . Gout   . H/O coronary artery bypass surgery 03/23/2012  . Headache   . Heart failure ([place]) 03/24/2012  . HLD (hyperlipidemia)   . Hyperlipidemia   . Hypertensive heart disease with heart failure ([place]) 01/22/2016  . Myocardial infarction ([place])    "I've had several light heart attacks over this past year; might have had one this time" (07/08/2016)  . NSTEMI (non-ST elevated myocardial infarction) ([place]) 03/19/2016  . On home oxygen therapy    "2L prn" (07/08/2016)  . Pneumonia   .  PONV (postoperative nausea and vomiting)   . Restless leg syndrome   . Shortness of breath   . Shortness of breath dyspnea   . Status post coronary artery stent placement 03/23/2012  . Uncontrolled type 2 diabetes mellitus with complication [facility])     Patient Active Problem List   Diagnosis Date Noted  . CKD (chronic kidney disease) stage 4, GFR 15-29 ml/min (HCC) 10/05/2017  . Anemia 10/05/2017  . Pre-operative cardiovascular examination 10/05/2017  . Shortness of breath   . Uncontrolled type 2 diabetes mellitus with complication ([place])   . Coronary artery disease  involving native heart with angina pectoris ([place])   . Acute respiratory failure with hypoxia ([place]) 03/19/2016  . Fluid overload 03/19/2016  . CAD (coronary artery disease)   . Chronic kidney disease, stage IV (severe) ([place])   . Chronic atrial fibrillation ([place])   . GERD (gastroesophageal reflux disease)   . Essential hypertension   . Hyperlipidemia   . DM due to underlying condition with diabetic chronic kidney disease ([place])   . BPH (benign prostatic hyperplasia)   . Depression with anxiety   . Chronic anticoagulation 01/22/2016  . Hypertensive heart disease with heart failure ([place]) 01/22/2016  . Chronic diastolic heart failure ([place]) 01/22/2016  . H/O coronary artery bypass surgery 03/23/2012  . Status post coronary artery stent placement 03/23/2012    Past Surgical History:  Procedure Laterality Date  . AV FISTULA PLACEMENT Right 07/13/2016   Procedure: ARTERIOVENOUS (AV) FISTULA CREATION RIGHT LOWER ARM;  Surgeon: [name], MD;  Location: [facility];  Service: Vascular;  Laterality: Right;  . BASCILIC VEIN TRANSPOSITION Right 02/17/2017   Procedure: BASILIC VEIN TRANSPOSITION RIGHT ARM;  Surgeon: [name], MD;  Location: [facility];  Service: Vascular;  Laterality: Right;  . CARDIAC CATHETERIZATION N/A 03/24/2016   Procedure: Right/Left Heart Cath and Coronary/Graft Angiography;  Surgeon: Troy Sine, MD;  Location: [facility] CV LAB;  Service: Cardiovascular;  Laterality: N/A;  . CATARACT EXTRACTION W/ INTRAOCULAR LENS  IMPLANT, BILATERAL Bilateral   . COLONOSCOPY    . CORONARY ANGIOPLASTY WITH STENT PLACEMENT     "I've had a few; some w/stents" (07/08/2016)  . CORONARY ARTERY BYPASS GRAFT  ?1995   CABG X"4"       Home Medications    Prior to Admission medications   Medication Sig Start Date End Date Taking? Authorizing Provider  allopurinol (ZYLOPRIM) 100 MG tablet Take 100 mg by mouth every evening.    Yes [provider]  amLODipine (NORVASC) 5 MG tablet Take  5 mg by mouth every evening.   Yes [provider]  apixaban (ELIQUIS) 2.5 MG TABS tablet Take 1 tablet (2.5 mg total) by mouth 2 (two) times daily. 07/14/16  Yes Alvia Grove, PA-C  aspirin EC 81 MG tablet Take 81 mg by mouth every evening.    Yes [provider]  Calcium Carb-Cholecalciferol (CALTRATE 600+D3 SOFT PO) Take 1 tablet by mouth every evening.    Yes [provider]  carvedilol (COREG) 25 MG tablet Take 25 mg by mouth 2 (two) times daily with a meal.   Yes [provider]  clopidogrel (PLAVIX) 75 MG tablet Take 75 mg by mouth every evening.    Yes [provider]  cyanocobalamin (,VITAMIN B-12,) 1000 MCG/ML injection Inject 1,000 mcg into the muscle every 30 (thirty) days.   Yes [provider]  ferrous sulfate 325 (65 FE) MG tablet Take 325 mg by mouth daily  with breakfast.   Yes [provider]  fexofenadine (ALLEGRA) 180 MG tablet Take 180 mg by mouth daily. 11/09/17  Yes [provider]  hydrALAZINE (APRESOLINE) 50 MG tablet Take 50 mg by mouth 2 (two) times daily.    Yes [provider]  hydrOXYzine (ATARAX/VISTARIL) 25 MG tablet Take 25 mg by mouth 3 (three) times daily as needed.   Yes [provider]  insulin aspart (NOVOLOG) 100 UNIT/ML injection Inject 10-15 Units into the skin 3 (three) times daily as needed for high blood sugar. Per sliding scale    Yes [provider]  insulin NPH Human (NOVOLIN N) 100 UNIT/ML injection Inject 30 Units into the skin 2 (two) times daily.    Yes [provider]  isosorbide mononitrate (IMDUR) 30 MG 24 hr tablet Take 90 mg by mouth daily at 6 (six) AM.   Yes [provider]  nitroGLYCERIN (NITROSTAT) 0.4 MG SL tablet Place 0.4 mg under the tongue every 5 (five) minutes as needed for chest pain.   Yes [provider]  omeprazole (PRILOSEC) 20 MG capsule Take 20 mg by mouth daily.   Yes [provider]    Oxycodone HCl 10 MG TABS Take 0.5 tablets (5 mg total) by mouth every 6 (six) hours as needed (pain). 02/17/17  Yes Rhyne, Hulen Shouts, PA-C  Probiotic Product (PROBIOTIC MULTI-ENZYME PO) Take 1 tablet by mouth daily.   Yes [provider]  ranolazine (RANEXA) 500 MG 12 hr tablet Take 1 tablet (500 mg total) by mouth 2 (two) times daily. 03/26/16  Yes Theodis Blaze, MD  rOPINIRole (REQUIP) 1 MG tablet Take 1 mg by mouth 2 (two) times daily.    Yes [provider]  rosuvastatin (CRESTOR) 5 MG tablet Take 5 mg by mouth daily. 08/25/17  Yes [provider]  tamsulosin (FLOMAX) 0.4 MG CAPS capsule Take 0.4 mg by mouth at bedtime.    Yes [provider]  torsemide (DEMADEX) 100 MG tablet Take 1 tablet (100 mg total) by mouth daily. 07/10/16  Yes Cherene Altes, MD    Family History Family History  Problem Relation Age of Onset  . Heart disease Mother   . CAD Mother   . Heart disease Father   . Diabetes Father   . Stroke Father   . Lung cancer Brother   . Diabetes Brother   . Diabetes Sister     Social History Social History   Tobacco Use  . Smoking status: Former Smoker    Packs/day: 2.00    Years: 12.00    Pack years: 24.00    Types: Cigarettes    Last attempt to quit: 1970    Years since quitting: 49.2  . Smokeless tobacco: Never Used  Substance Use Topics  . Alcohol use: Yes    Alcohol/week: 0.0 oz    Comment: 07/08/2016 nothing since 1970"  . Drug use: No     Allergies   No known allergies   Review of Systems Review of Systems   Physical Exam Updated Vital Signs BP (!) 153/91   Pulse 73   Temp 98.2 F (36.8 C) (Oral)   Resp 15   Ht 5\' 8"  (1.727 m)   Wt 76.2 kg (168 lb)   SpO2 99%   BMI 25.54 kg/m   Physical Exam   ED Treatments / Results  Labs (all labs ordered are listed, but only abnormal results are displayed) Labs Reviewed  CBC WITH DIFFERENTIAL/PLATELET - Abnormal;  Notable for the following components:       Result Value   RBC 3.24 (*)    Hemoglobin 10.2 (*)    HCT 28.8 (*)    All other components within normal limits  COMPREHENSIVE METABOLIC PANEL - Abnormal; Notable for the following components:   Sodium 115 (*)    Chloride 81 (*)    CO2 21 (*)    Glucose, Bld 891 (*)    BUN 62 (*)    Creatinine, Ser 3.50 (*)    Calcium 8.7 (*)    Total Protein 5.8 (*)    Albumin 3.2 (*)    AST 14 (*)    ALT 12 (*)    GFR calc non Af Amer 15 (*)    GFR calc Af Amer 18 (*)    All other components within normal limits  GASTROINTESTINAL PANEL BY PCR, STOOL (REPLACES STOOL CULTURE)  C DIFFICILE QUICK SCREEN W PCR REFLEX  I-STAT CG4 LACTIC ACID, ED  I-STAT CG4 LACTIC ACID, ED   Sodium  Date Value Ref Range Status  11/24/2017 115 (LL) 135 - 145 mmol/L Final    Comment:    CRITICAL RESULT CALLED TO, READ BACK BY AND VERIFIED WITH: K.PATT,RN 11/24/17 1546 DAVISB   02/17/2017 137 135 - 145 mmol/L Final  07/13/2016 134 (L) 135 - 145 mmol/L Final  07/09/2016 135 135 - 145 mmol/L Final    EKG  EKG Interpretation None      BUN  Date Value Ref Range Status  11/24/2017 62 (H) 6 - 20 mg/dL Final  07/09/2016 112 (H) 6 - 20 mg/dL Final  07/08/2016 105 (H) 6 - 20 mg/dL Final  07/07/2016 104 (H) 6 - 20 mg/dL Final   Creatinine, Ser  Date Value Ref Range Status  11/24/2017 3.50 (H) 0.61 - 1.24 mg/dL Final  07/09/2016 3.37 (H) 0.61 - 1.24 mg/dL Final  07/08/2016 3.25 (H) 0.61 - 1.24 mg/dL Final  07/07/2016 3.46 (H) 0.61 - 1.24 mg/dL Final      Radiology Dg Chest 2 View  Result Date: 11/24/2017 CLINICAL DATA:  Diarrhea. EXAM: CHEST - 2 VIEW COMPARISON:  None. FINDINGS: Sternotomy wires overlie normal cardiac silhouette. No effusion, infiltrate or pneumothorax. No acute osseous abnormality. IMPRESSION: No acute cardiopulmonary process. Electronically Signed   By: Suzy Bouchard M.D.   On: 11/24/2017 16:20    Procedures .Critical Care Performed by: Daleen Bo, MD Authorized by:  Daleen Bo, MD   Critical care provider statement:    Critical care time (minutes):  35   Critical care start time:  11/24/2017 5:10 PM   Critical care end time:  11/24/2017 7:37 PM   Critical care time was exclusive of:  Separately billable procedures and treating other patients   Critical care was necessary to treat or prevent imminent or life-threatening deterioration of the following conditions:  Metabolic crisis   Critical care was time spent personally by me on the following activities:  Blood draw for specimens, development of treatment plan with patient or surrogate, discussions with consultants, evaluation of patient's response to treatment, examination of patient, obtaining history from patient or surrogate, ordering and performing treatments and interventions, ordering and review of laboratory studies, pulse oximetry, re-evaluation of patient's condition, review of old charts and ordering and review of radiographic studies    (including critical care time)  Medications Ordered in ED Medications  0.9 %  sodium chloride infusion ( Intravenous New Bag/Given 11/24/17 1934)  insulin NPH Human (HUMULIN N,NOVOLIN N) injection  30 Units (not administered)  insulin aspart (novoLOG) injection 15 Units (not administered)  sodium chloride 0.9 % bolus 500 mL (500 mLs Intravenous New Bag/Given 11/24/17 1916)     Initial Impression / Assessment and Plan / ED Course  I have reviewed the triage vital signs and the nursing notes.  Pertinent labs & imaging results that were available during my care of the patient were reviewed by me and considered in my medical decision making (see chart for details).  Clinical Course as of Nov 24 1957  Wed Nov 24, 2017  1939 Laboratory assessment for evaluation of diarrhea, with normal white blood cell count, and hemoglobin low at 10.2.  Incidental elevation of glucose 891 with normal anion gap, patient did not take insulin for 2 days by his own volition.   Metabolic evaluation remarkable for significant hyponatremia, hypochloremia, slightly low CO2.  Renal function stable with chronic renal insufficiency creatinine is at 3.5.  Total protein and albumin slightly low.  [EW]  1941 No edema or infiltrate DG Chest 2 View [EW]  1956 Corrected sodium-based on measured level 115, and glucose 891, equals 128-134 mEq/L.  [EW]    Clinical Course User Index [EW] Daleen Bo, MD     Patient Vitals for the past 24 hrs:  BP Temp Temp src Pulse Resp SpO2 Height Weight  11/24/17 1906 (!) 153/91 - - 73 15 99 % - -  11/24/17 1351 (!) 140/44 98.2 F (36.8 C) Oral - 16 100 % 5\' 8"  (1.727 m) 76.2 kg (168 lb)    7:37 PM Reevaluation with update and discussion. After initial assessment and treatment, an updated evaluation reveals he has not stooled since arrival.  Clinical evaluation unchanged.Daleen Bo     Final Clinical Impressions(s) / ED Diagnoses   Final diagnoses:  Diarrhea, unspecified type  Hyperglycemia  Hyponatremia   Recurrent diarrhea, possible mild hyponatremia associated.  Marked elevation of glucose, patient has not taken insulin for 48 hours.  Nursing Notes Reviewed/ Care Coordinated Applicable Imaging Reviewed Interpretation of Laboratory Data incorporated into ED treatment   Plan: [place]   ED Discharge Orders    None       Daleen Bo, MD 11/24/17 2349

## 2017-11-24 NOTE — ED Triage Notes (Signed)
Pt tx with antibiotics 3 weeks ago for diarrhea.  The last week the diarrhea has returned, and pt has had 12 bout of diarrhea per day.  Prescribed cipro and flagyl, but sent here from MD's office for dehydration.  Hx of chf.

## 2017-11-25 DIAGNOSIS — E1100 Type 2 diabetes mellitus with hyperosmolarity without nonketotic hyperglycemic-hyperosmolar coma (NKHHC): Secondary | ICD-10-CM | POA: Diagnosis not present

## 2017-11-25 DIAGNOSIS — R197 Diarrhea, unspecified: Secondary | ICD-10-CM | POA: Diagnosis present

## 2017-11-25 LAB — CBG MONITORING, ED
GLUCOSE-CAPILLARY: 171 mg/dL — AB (ref 65–99)
GLUCOSE-CAPILLARY: 204 mg/dL — AB (ref 65–99)
GLUCOSE-CAPILLARY: 237 mg/dL — AB (ref 65–99)
Glucose-Capillary: 204 mg/dL — ABNORMAL HIGH (ref 65–99)
Glucose-Capillary: 231 mg/dL — ABNORMAL HIGH (ref 65–99)
Glucose-Capillary: 224 mg/dL — ABNORMAL HIGH (ref 65–99)

## 2017-11-25 LAB — BASIC METABOLIC PANEL
Anion gap: 12 (ref 5–15)
BUN: 59 mg/dL — AB (ref 6–20)
CALCIUM: 8.4 mg/dL — AB (ref 8.9–10.3)
CO2: 21 mmol/L — AB (ref 22–32)
Chloride: 92 mmol/L — ABNORMAL LOW (ref 101–111)
Creatinine, Ser: 3.32 mg/dL — ABNORMAL HIGH (ref 0.61–1.24)
GFR calc Af Amer: 19 mL/min — ABNORMAL LOW (ref >60)
GFR calc non Af Amer: 16 mL/min — ABNORMAL LOW (ref >60)
Glucose, Bld: 202 mg/dL — ABNORMAL HIGH (ref 65–99)
Potassium: 3.5 mmol/L (ref 3.5–5.1)
Sodium: 125 mmol/L — ABNORMAL LOW (ref 135–145)

## 2017-11-25 LAB — C DIFFICILE QUICK SCREEN W PCR REFLEX
C DIFFICILE (CDIFF) INTERP: NOT DETECTED
C DIFFICILE (CDIFF) TOXIN: NEGATIVE
C Diff antigen: NEGATIVE

## 2017-11-25 LAB — I-STAT CG4 LACTIC ACID, ED: Lactic Acid, Venous: 1.13 mmol/L (ref 0.5–1.9)

## 2017-11-25 MED ORDER — CALCIUM CARBONATE-VITAMIN D 500-200 MG-UNIT PO TABS: 1.0000 | ORAL_TABLET | Freq: Every evening | ORAL | Status: DC

## 2017-11-25 MED ORDER — INSULIN ASPART 100 UNIT/ML ~~LOC~~ SOLN
0.0000 [IU] | Freq: Three times a day (TID) | SUBCUTANEOUS | Status: DC
  Administered 2017-11-25: 7 [IU] via SUBCUTANEOUS
  Filled 2017-11-25: qty 1

## 2017-11-25 MED ORDER — INSULIN NPH (HUMAN) (ISOPHANE) 100 UNIT/ML ~~LOC~~ SUSP
30.0000 [IU] | Freq: Two times a day (BID) | SUBCUTANEOUS | Status: DC
  Administered 2017-11-25: 30 [IU] via SUBCUTANEOUS
  Filled 2017-11-25: qty 10

## 2017-11-25 MED ORDER — SODIUM CHLORIDE 0.9 % IV SOLN
1.0000 g | Freq: Once | INTRAVENOUS | Status: AC
  Administered 2017-11-25: 1 g via INTRAVENOUS
  Filled 2017-11-25 (×2): qty 10

## 2017-11-25 NOTE — Discharge Summary (Signed)
Physician Discharge Summary  NORTON BIVINS UUV:[id number] DOB: 1941-06-28 DOA: 11/24/2017  PCP: Ocie Doyne., MD  Admit date: 11/24/2017 Discharge date: 11/25/2017  Time spent: 10 minutes  Recommendations for Outpatient Follow-up:  Hyperosmolar hyperglycemic nonketotic syndrome: Acute.  Patient presents with blood sugar of 891 on admission without anion gap.  Last hemoglobin A1c noted to be 10.4 back in 03/2016.  Significant hyponatremia noted but when corrected for glucose is more within her normal range. - Admit to stepdown - Check urinalysis - Check hemoglobin A1c in a.m. - Start glucose stabilizer protocol - Slow rehydration with normal saline IV fluids at 100 mL/h given CHF history - BMPs every 6 hours x3 - will monitor and correct for electrolyte abnormalities - Diabetic education consult   Diarrhea: Patient reports having recurrence of diarrhea previously treated with ciprofloxacin and metronidazole 3 weeks ago, but noted to be noninfectious.  Patient not noted to have had stool since prior to arrival. - Check stool C. difficile and GI panel   Systolic congestive heart failure: Chronic.  Patient appears to be hypovolemic at this time.  On physical exam patient has mild 1+ pitting edema.  Last echocardiogram showed EF 40% with restricted diastolic relaxation and moderate mitral regurgitation on 10/12/17. - Strict I&O's - Daily weights - Continue isosorbide mononitrate   Chronic kidney disease stage IV: Patient presents with a creatinine of 3.5 with BUN of 62.  Elevated BUN suggests dehydration related to diarrhea. - Recheck BMP in am   Essential hypertension - Continue Coreg, hydralazine, amlodipine - Held torsemide, restart when medically approach   Chronic atrial fibrillation on chronic anticoagulation: Stable - Continue Eliquis    Coronary artery disease: Patient status post CABG.- Continue Ranexa, Plavix, and aspirin  Anemia chronic kidney disease: Hemoglobin 10.2 on  admission and patient reports no history of bleeding. - Continue ferrous sulfate   GERD - Continue pharmacy substitution of Protonix  Goals of care -spoke at length with patient regarding his need to remain in the hospital for further workup and stabilization Counseled patient he had multiple risk factors which significantly increased his likelihood of stroke,MI, DEATH. Counseled patient his uncontrolled diabetes along with these other risk factors necessitated the need for him to e admitted for further workup and stabilization. Patient stated he understood the risks but would like to sign out AMA.    Discharge Diagnoses:  Principal Problem:   Hyperosmolar non-ketotic state in patient with type 2 diabetes mellitus ([place]) Active Problems:   Chronic atrial fibrillation (HCC)   GERD (gastroesophageal reflux disease)   Essential hypertension   Chronic anticoagulation   Diarrhea   Discharge Condition: AMA  Diet recommendation:   Filed Weights   11/24/17 1351  Weight: 168 lb (76.2 kg)    History of present illness:  77 y.o. male with medical history significant of HTN, HLD, CAD s/p CABG, sCHF last EF 40% with moderate mitral regurgitation on 10/12/17, A. fib on Eliquis, COPD, and DM type II( uncontrolled); presents with complaints of diarrhea for the last 4 days.  Reports having multiple nonbloody small liquid stools per hour at home.  Associated symptoms of lightheadedness, poor appetite, lower extremity swelling, general weakness, weight loss of approximately 10 more pounds in the last month, and urinary frequency which is reported as chronic.Marland Kitchen He had a similar episode with diarrhea 2-3 weeks ago for which he was treated with a course of ciprofloxacin and Flagyl by his primary care provider with improvement of symptoms.  Family reports that  stool studies were negative for infection.  Patient denies having any significant abdominal pain, nausea, vomiting, fever, cough, shortness of breath,  loss of consciousness, or falls.  He reports that his blood sugars are well controlled, but notes that over the last 2 days he is not taking any of his insulin.  Patient does have a wound that he sustained 2-3 weeks ago to the right lower leg that he is being attended to by his primary care provider.  Patient reports there is no need to have it evaluated.  He had gone to his primary care provider this morning and was thought to be significantly dehydrated with elevated blood sugars for which she was directed to the emergency department for further evaluation.     ED Course: Upon admission into the emergency department patient was noted to be afebrile with vital signs relatively within normal limits.  Labs revealed WBC 8.2, hemoglobin 10.2, platelets 249, sodium 115, potassium 4.3, BUN 62, creatinine 3.5, glucose 891, anion gap 13, and lactic acid 1.59.  Chest x-ray showed no acute abnormalities.  Patient was given 500 mL of normal saline, and then placed on a rate of 125 mL/h.  TRH called to admit. Patient left AMA  Discharge Exam: Vitals:   11/25/17 0800 11/25/17 0900 11/25/17 1015 11/25/17 1045  BP: (!) 169/58 (!) 165/61 (!) 156/61 (!) 152/85  Pulse: 70 71 69 73  Resp: 16 14 16 18   Temp:      TempSrc:      SpO2: 100% 99% 100% 97%  Weight:      Height:        Patient left AMA    Discharge Instructions   Allergies as of 11/25/2017      Reactions   No Known Allergies       Medication List    ASK your doctor about these medications   allopurinol 100 MG tablet Commonly known as:  ZYLOPRIM Take 100 mg by mouth every evening.   amLODipine 5 MG tablet Commonly known as:  NORVASC Take 5 mg by mouth every evening.   apixaban 2.5 MG Tabs tablet Commonly known as:  ELIQUIS Take 1 tablet (2.5 mg total) by mouth 2 (two) times daily.   aspirin EC 81 MG tablet Take 81 mg by mouth every evening.   CALTRATE 600+D3 SOFT PO Take 1 tablet by mouth every evening.   carvedilol 25 MG  tablet Commonly known as:  COREG Take 25 mg by mouth 2 (two) times daily with a meal.   clopidogrel 75 MG tablet Commonly known as:  PLAVIX Take 75 mg by mouth every evening.   cyanocobalamin 1000 MCG/ML injection Commonly known as:  (VITAMIN B-12) Inject 1,000 mcg into the muscle every 30 (thirty) days.   ferrous sulfate 325 (65 FE) MG tablet Take 325 mg by mouth daily with breakfast.   fexofenadine 180 MG tablet Commonly known as:  ALLEGRA Take 180 mg by mouth daily.   hydrALAZINE 50 MG tablet Commonly known as:  APRESOLINE Take 50 mg by mouth 2 (two) times daily.   hydrOXYzine 25 MG tablet Commonly known as:  ATARAX/VISTARIL Take 25 mg by mouth 3 (three) times daily as needed.   insulin aspart 100 UNIT/ML injection Commonly known as:  novoLOG Inject 10-15 Units into the skin 3 (three) times daily as needed for high blood sugar. Per sliding scale   isosorbide mononitrate 30 MG 24 hr tablet Commonly known as:  IMDUR Take 90 mg by mouth daily at  6 (six) AM.   nitroGLYCERIN 0.4 MG SL tablet Commonly known as:  NITROSTAT Place 0.4 mg under the tongue every 5 (five) minutes as needed for chest pain.   NOVOLIN N 100 UNIT/ML injection Generic drug:  insulin NPH Human Inject 30 Units into the skin 2 (two) times daily.   omeprazole 20 MG capsule Commonly known as:  PRILOSEC Take 20 mg by mouth daily.   Oxycodone HCl 10 MG Tabs Take 0.5 tablets (5 mg total) by mouth every 6 (six) hours as needed (pain).   PROBIOTIC MULTI-ENZYME PO Take 1 tablet by mouth daily.   ranolazine 500 MG 12 hr tablet Commonly known as:  RANEXA Take 1 tablet (500 mg total) by mouth 2 (two) times daily.   rOPINIRole 1 MG tablet Commonly known as:  REQUIP Take 1 mg by mouth 2 (two) times daily.   rosuvastatin 5 MG tablet Commonly known as:  CRESTOR Take 5 mg by mouth daily.   tamsulosin 0.4 MG Caps capsule Commonly known as:  FLOMAX Take 0.4 mg by mouth at bedtime.   torsemide 100 MG  tablet Commonly known as:  DEMADEX Take 1 tablet (100 mg total) by mouth daily.      Allergies  Allergen Reactions  . No Known Allergies       The results of significant diagnostics from this hospitalization (including imaging, microbiology, ancillary and laboratory) are listed below for reference.    Significant Diagnostic Studies: Dg Chest 2 View  Result Date: 11/24/2017 CLINICAL DATA:  Diarrhea. EXAM: CHEST - 2 VIEW COMPARISON:  None. FINDINGS: Sternotomy wires overlie normal cardiac silhouette. No effusion, infiltrate or pneumothorax. No acute osseous abnormality. IMPRESSION: No acute cardiopulmonary process. Electronically Signed   By: Suzy Bouchard M.D.   On: 11/24/2017 16:20    Microbiology: Recent Results (from the past 240 hour(s))  C difficile quick scan w PCR reflex     Status: None   Collection Time: 11/24/17 11:25 PM  Result Value Ref Range Status   C Diff antigen NEGATIVE NEGATIVE Final   C Diff toxin NEGATIVE NEGATIVE Final   C Diff interpretation No C. difficile detected.  Final    Comment: Performed at [facility], [place] [address], [place], [place] [address]     Labs: Basic Metabolic Panel: Recent Labs  Lab 11/24/17 1422 11/24/17 2030 11/25/17 0225  NA 115* 118* 125*  K 4.3 3.8 3.5  CL 81* 86* 92*  CO2 21* 20* 21*  GLUCOSE 891* 722* 202*  BUN 62* 61* 59*  CREATININE 3.50* 3.30* 3.32*  CALCIUM 8.7* 8.3* 8.4*   Liver Function Tests: Recent Labs  Lab 11/24/17 1422  AST 14*  ALT 12*  ALKPHOS 76  BILITOT 0.9  PROT 5.8*  ALBUMIN 3.2*   No results for input(s): LIPASE, AMYLASE in the last 168 hours. No results for input(s): AMMONIA in the last 168 hours. CBC: Recent Labs  Lab 11/24/17 1422  WBC 8.2  NEUTROABS 6.0  HGB 10.2*  HCT 28.8*  MCV 88.9  PLT 249   Cardiac Enzymes: No results for input(s): CKTOTAL, CKMB, CKMBINDEX, TROPONINI in the last 168 hours. BNP: BNP (last 3 results) No results for input(s): BNP in the last  8760 hours.  ProBNP (last 3 results) No results for input(s): PROBNP in the last 8760 hours.  CBG: Recent Labs  Lab 11/25/17 0224 11/25/17 0409 11/25/17 0544 11/25/17 0755 11/25/17 1023  GLUCAP 204* 231* 224* 204* 171*       Signed:  Dia Crawford, MD [facility] [phone] pager

## 2017-11-25 NOTE — ED Notes (Signed)
Spoke to Dr.Smith regarding CBG dropped 250 points; pt c/o dizziness and not feeling well.

## 2017-11-25 NOTE — ED Notes (Addendum)
Secretary paging admitting doctor for nurse.

## 2017-11-25 NOTE — ED Notes (Signed)
Main lab notified to resend calcium gluconate, first bag sent would not reconstitute.

## 2017-11-25 NOTE — ED Notes (Signed)
Spoke with admit doctor regarding patient's request being discharged.  Doctor stated need blood sugars below 200 consecutively more than 3 times. Diet ordered card modified.  Notified patient and 2 family members. Patient stated "I don't want to be difficult but I feel good and want to go home. I would like to speak with doctor" Notified will obtain a blood sugar and paged doctor.

## 2017-11-25 NOTE — ED Notes (Addendum)
Doctor called back spoke with the nurse. Stated patient does need to be admitted for blood sugar control and other diagnoses. Notified doctor CBG 171.  Doctor spoke with patient on the nurses phone in detail regarding plan of care.  Patient still wants to be discharged and will follow up with multiple doctors and come back to the ED.  Patient will have to sign out against medical advice.  Patient and 2 family members verbalized understanding.

## 2017-11-25 NOTE — ED Notes (Signed)
Pt cbg 204

## 2017-11-25 NOTE — ED Notes (Addendum)
Pt vomiting; c/o cramping in L leg; Dr.Smith made aware; Verbal orders to stop insulin and D5% gtt

## 2017-11-25 NOTE — ED Notes (Signed)
Patient states feels better and would like to go home. Two family members at bedside.

## 2017-11-26 LAB — HEMOGLOBIN A1C
Hgb A1c MFr Bld: 14.1 % — ABNORMAL HIGH (ref 4.8–5.6)
Mean Plasma Glucose: 358 mg/dL

## 2017-11-28 LAB — MISC LABCORP TEST (SEND OUT): LABCORP TEST CODE: 183480

## 2017-11-29 DIAGNOSIS — M109 Gout, unspecified: Secondary | ICD-10-CM | POA: Diagnosis not present

## 2017-11-29 DIAGNOSIS — K210 Gastro-esophageal reflux disease with esophagitis: Secondary | ICD-10-CM | POA: Diagnosis not present

## 2017-11-29 DIAGNOSIS — E785 Hyperlipidemia, unspecified: Secondary | ICD-10-CM | POA: Diagnosis not present

## 2017-11-29 DIAGNOSIS — J449 Chronic obstructive pulmonary disease, unspecified: Secondary | ICD-10-CM | POA: Diagnosis not present

## 2017-11-29 DIAGNOSIS — I83019 Varicose veins of right lower extremity with ulcer of unspecified site: Secondary | ICD-10-CM | POA: Diagnosis not present

## 2017-11-29 DIAGNOSIS — I4891 Unspecified atrial fibrillation: Secondary | ICD-10-CM | POA: Diagnosis not present

## 2017-11-29 DIAGNOSIS — I2510 Atherosclerotic heart disease of native coronary artery without angina pectoris: Secondary | ICD-10-CM | POA: Diagnosis not present

## 2017-11-29 DIAGNOSIS — N189 Chronic kidney disease, unspecified: Secondary | ICD-10-CM | POA: Diagnosis not present

## 2017-11-29 DIAGNOSIS — G2581 Restless legs syndrome: Secondary | ICD-10-CM | POA: Diagnosis not present

## 2017-11-29 DIAGNOSIS — R197 Diarrhea, unspecified: Secondary | ICD-10-CM | POA: Diagnosis not present

## 2017-11-29 DIAGNOSIS — N401 Enlarged prostate with lower urinary tract symptoms: Secondary | ICD-10-CM | POA: Diagnosis not present

## 2017-11-29 DIAGNOSIS — Z79899 Other long term (current) drug therapy: Secondary | ICD-10-CM | POA: Diagnosis not present

## 2017-12-03 DIAGNOSIS — E785 Hyperlipidemia, unspecified: Secondary | ICD-10-CM | POA: Diagnosis not present

## 2017-12-03 DIAGNOSIS — I83019 Varicose veins of right lower extremity with ulcer of unspecified site: Secondary | ICD-10-CM | POA: Diagnosis not present

## 2017-12-03 DIAGNOSIS — M109 Gout, unspecified: Secondary | ICD-10-CM | POA: Diagnosis not present

## 2017-12-03 DIAGNOSIS — I10 Essential (primary) hypertension: Secondary | ICD-10-CM | POA: Diagnosis not present

## 2017-12-03 DIAGNOSIS — N401 Enlarged prostate with lower urinary tract symptoms: Secondary | ICD-10-CM | POA: Diagnosis not present

## 2017-12-03 DIAGNOSIS — I4891 Unspecified atrial fibrillation: Secondary | ICD-10-CM | POA: Diagnosis not present

## 2017-12-03 DIAGNOSIS — E118 Type 2 diabetes mellitus with unspecified complications: Secondary | ICD-10-CM | POA: Diagnosis not present

## 2017-12-03 DIAGNOSIS — I2510 Atherosclerotic heart disease of native coronary artery without angina pectoris: Secondary | ICD-10-CM | POA: Diagnosis not present

## 2017-12-03 DIAGNOSIS — E663 Overweight: Secondary | ICD-10-CM | POA: Diagnosis not present

## 2017-12-03 DIAGNOSIS — G2581 Restless legs syndrome: Secondary | ICD-10-CM | POA: Diagnosis not present

## 2017-12-03 DIAGNOSIS — I509 Heart failure, unspecified: Secondary | ICD-10-CM | POA: Diagnosis not present

## 2017-12-03 DIAGNOSIS — N184 Chronic kidney disease, stage 4 (severe): Secondary | ICD-10-CM | POA: Diagnosis not present

## 2017-12-10 DIAGNOSIS — E118 Type 2 diabetes mellitus with unspecified complications: Secondary | ICD-10-CM | POA: Diagnosis not present

## 2017-12-10 DIAGNOSIS — K210 Gastro-esophageal reflux disease with esophagitis: Secondary | ICD-10-CM | POA: Diagnosis not present

## 2017-12-10 DIAGNOSIS — M109 Gout, unspecified: Secondary | ICD-10-CM | POA: Diagnosis not present

## 2017-12-10 DIAGNOSIS — N189 Chronic kidney disease, unspecified: Secondary | ICD-10-CM | POA: Diagnosis not present

## 2017-12-10 DIAGNOSIS — I83019 Varicose veins of right lower extremity with ulcer of unspecified site: Secondary | ICD-10-CM | POA: Diagnosis not present

## 2017-12-10 DIAGNOSIS — I83029 Varicose veins of left lower extremity with ulcer of unspecified site: Secondary | ICD-10-CM | POA: Diagnosis not present

## 2017-12-10 DIAGNOSIS — E785 Hyperlipidemia, unspecified: Secondary | ICD-10-CM | POA: Diagnosis not present

## 2017-12-10 DIAGNOSIS — I2510 Atherosclerotic heart disease of native coronary artery without angina pectoris: Secondary | ICD-10-CM | POA: Diagnosis not present

## 2017-12-10 DIAGNOSIS — G2581 Restless legs syndrome: Secondary | ICD-10-CM | POA: Diagnosis not present

## 2017-12-10 DIAGNOSIS — I509 Heart failure, unspecified: Secondary | ICD-10-CM | POA: Diagnosis not present

## 2017-12-10 DIAGNOSIS — I4891 Unspecified atrial fibrillation: Secondary | ICD-10-CM | POA: Diagnosis not present

## 2017-12-10 DIAGNOSIS — N401 Enlarged prostate with lower urinary tract symptoms: Secondary | ICD-10-CM | POA: Diagnosis not present

## 2017-12-17 DIAGNOSIS — I2510 Atherosclerotic heart disease of native coronary artery without angina pectoris: Secondary | ICD-10-CM | POA: Diagnosis not present

## 2017-12-17 DIAGNOSIS — I83019 Varicose veins of right lower extremity with ulcer of unspecified site: Secondary | ICD-10-CM | POA: Diagnosis not present

## 2017-12-17 DIAGNOSIS — I509 Heart failure, unspecified: Secondary | ICD-10-CM | POA: Diagnosis not present

## 2017-12-17 DIAGNOSIS — E785 Hyperlipidemia, unspecified: Secondary | ICD-10-CM | POA: Diagnosis not present

## 2017-12-17 DIAGNOSIS — G2581 Restless legs syndrome: Secondary | ICD-10-CM | POA: Diagnosis not present

## 2017-12-17 DIAGNOSIS — Z6825 Body mass index (BMI) 25.0-25.9, adult: Secondary | ICD-10-CM | POA: Diagnosis not present

## 2017-12-17 DIAGNOSIS — I83029 Varicose veins of left lower extremity with ulcer of unspecified site: Secondary | ICD-10-CM | POA: Diagnosis not present

## 2017-12-17 DIAGNOSIS — E118 Type 2 diabetes mellitus with unspecified complications: Secondary | ICD-10-CM | POA: Diagnosis not present

## 2017-12-17 DIAGNOSIS — K210 Gastro-esophageal reflux disease with esophagitis: Secondary | ICD-10-CM | POA: Diagnosis not present

## 2017-12-17 DIAGNOSIS — N401 Enlarged prostate with lower urinary tract symptoms: Secondary | ICD-10-CM | POA: Diagnosis not present

## 2017-12-17 DIAGNOSIS — N184 Chronic kidney disease, stage 4 (severe): Secondary | ICD-10-CM | POA: Diagnosis not present

## 2017-12-17 DIAGNOSIS — I4891 Unspecified atrial fibrillation: Secondary | ICD-10-CM | POA: Diagnosis not present

## 2017-12-24 DIAGNOSIS — Z6824 Body mass index (BMI) 24.0-24.9, adult: Secondary | ICD-10-CM | POA: Diagnosis not present

## 2017-12-24 DIAGNOSIS — L03115 Cellulitis of right lower limb: Secondary | ICD-10-CM | POA: Diagnosis not present

## 2017-12-24 DIAGNOSIS — N184 Chronic kidney disease, stage 4 (severe): Secondary | ICD-10-CM | POA: Diagnosis not present

## 2017-12-24 DIAGNOSIS — I509 Heart failure, unspecified: Secondary | ICD-10-CM | POA: Diagnosis not present

## 2017-12-24 DIAGNOSIS — I83019 Varicose veins of right lower extremity with ulcer of unspecified site: Secondary | ICD-10-CM | POA: Diagnosis not present

## 2017-12-24 DIAGNOSIS — N189 Chronic kidney disease, unspecified: Secondary | ICD-10-CM | POA: Diagnosis not present

## 2017-12-24 DIAGNOSIS — I83029 Varicose veins of left lower extremity with ulcer of unspecified site: Secondary | ICD-10-CM | POA: Diagnosis not present

## 2017-12-30 DIAGNOSIS — I83029 Varicose veins of left lower extremity with ulcer of unspecified site: Secondary | ICD-10-CM | POA: Diagnosis not present

## 2017-12-30 DIAGNOSIS — Z6824 Body mass index (BMI) 24.0-24.9, adult: Secondary | ICD-10-CM | POA: Diagnosis not present

## 2017-12-30 DIAGNOSIS — I83019 Varicose veins of right lower extremity with ulcer of unspecified site: Secondary | ICD-10-CM | POA: Diagnosis not present

## 2018-01-07 DIAGNOSIS — I83019 Varicose veins of right lower extremity with ulcer of unspecified site: Secondary | ICD-10-CM | POA: Diagnosis not present

## 2018-01-07 DIAGNOSIS — Z6824 Body mass index (BMI) 24.0-24.9, adult: Secondary | ICD-10-CM | POA: Diagnosis not present

## 2018-01-07 DIAGNOSIS — I83029 Varicose veins of left lower extremity with ulcer of unspecified site: Secondary | ICD-10-CM | POA: Diagnosis not present

## 2018-01-13 DIAGNOSIS — G2581 Restless legs syndrome: Secondary | ICD-10-CM | POA: Diagnosis not present

## 2018-01-13 DIAGNOSIS — N184 Chronic kidney disease, stage 4 (severe): Secondary | ICD-10-CM | POA: Diagnosis not present

## 2018-01-13 DIAGNOSIS — M109 Gout, unspecified: Secondary | ICD-10-CM | POA: Diagnosis not present

## 2018-01-13 DIAGNOSIS — N189 Chronic kidney disease, unspecified: Secondary | ICD-10-CM | POA: Diagnosis not present

## 2018-01-13 DIAGNOSIS — I83029 Varicose veins of left lower extremity with ulcer of unspecified site: Secondary | ICD-10-CM | POA: Diagnosis not present

## 2018-01-13 DIAGNOSIS — N401 Enlarged prostate with lower urinary tract symptoms: Secondary | ICD-10-CM | POA: Diagnosis not present

## 2018-01-13 DIAGNOSIS — E785 Hyperlipidemia, unspecified: Secondary | ICD-10-CM | POA: Diagnosis not present

## 2018-01-13 DIAGNOSIS — I83019 Varicose veins of right lower extremity with ulcer of unspecified site: Secondary | ICD-10-CM | POA: Diagnosis not present

## 2018-01-13 DIAGNOSIS — I4891 Unspecified atrial fibrillation: Secondary | ICD-10-CM | POA: Diagnosis not present

## 2018-01-13 DIAGNOSIS — I2510 Atherosclerotic heart disease of native coronary artery without angina pectoris: Secondary | ICD-10-CM | POA: Diagnosis not present

## 2018-01-13 DIAGNOSIS — Z1331 Encounter for screening for depression: Secondary | ICD-10-CM | POA: Diagnosis not present

## 2018-01-13 DIAGNOSIS — I10 Essential (primary) hypertension: Secondary | ICD-10-CM | POA: Diagnosis not present

## 2018-01-20 DIAGNOSIS — N184 Chronic kidney disease, stage 4 (severe): Secondary | ICD-10-CM | POA: Diagnosis not present

## 2018-01-20 DIAGNOSIS — I83019 Varicose veins of right lower extremity with ulcer of unspecified site: Secondary | ICD-10-CM | POA: Diagnosis not present

## 2018-01-20 DIAGNOSIS — Z6824 Body mass index (BMI) 24.0-24.9, adult: Secondary | ICD-10-CM | POA: Diagnosis not present

## 2018-01-20 DIAGNOSIS — I83029 Varicose veins of left lower extremity with ulcer of unspecified site: Secondary | ICD-10-CM | POA: Diagnosis not present

## 2018-01-28 DIAGNOSIS — I83019 Varicose veins of right lower extremity with ulcer of unspecified site: Secondary | ICD-10-CM | POA: Diagnosis not present

## 2018-01-28 DIAGNOSIS — Z6823 Body mass index (BMI) 23.0-23.9, adult: Secondary | ICD-10-CM | POA: Diagnosis not present

## 2018-01-28 DIAGNOSIS — I83029 Varicose veins of left lower extremity with ulcer of unspecified site: Secondary | ICD-10-CM | POA: Diagnosis not present

## 2018-01-31 DIAGNOSIS — I509 Heart failure, unspecified: Secondary | ICD-10-CM | POA: Diagnosis not present

## 2018-01-31 DIAGNOSIS — L97522 Non-pressure chronic ulcer of other part of left foot with fat layer exposed: Secondary | ICD-10-CM | POA: Diagnosis not present

## 2018-01-31 DIAGNOSIS — L97822 Non-pressure chronic ulcer of other part of left lower leg with fat layer exposed: Secondary | ICD-10-CM | POA: Diagnosis not present

## 2018-01-31 DIAGNOSIS — I252 Old myocardial infarction: Secondary | ICD-10-CM | POA: Diagnosis not present

## 2018-01-31 DIAGNOSIS — L97812 Non-pressure chronic ulcer of other part of right lower leg with fat layer exposed: Secondary | ICD-10-CM | POA: Diagnosis not present

## 2018-01-31 DIAGNOSIS — N184 Chronic kidney disease, stage 4 (severe): Secondary | ICD-10-CM | POA: Diagnosis not present

## 2018-01-31 DIAGNOSIS — N400 Enlarged prostate without lower urinary tract symptoms: Secondary | ICD-10-CM | POA: Diagnosis not present

## 2018-01-31 DIAGNOSIS — Z794 Long term (current) use of insulin: Secondary | ICD-10-CM | POA: Diagnosis not present

## 2018-01-31 DIAGNOSIS — M109 Gout, unspecified: Secondary | ICD-10-CM | POA: Diagnosis not present

## 2018-01-31 DIAGNOSIS — E1122 Type 2 diabetes mellitus with diabetic chronic kidney disease: Secondary | ICD-10-CM | POA: Diagnosis not present

## 2018-01-31 DIAGNOSIS — I87313 Chronic venous hypertension (idiopathic) with ulcer of bilateral lower extremity: Secondary | ICD-10-CM | POA: Diagnosis not present

## 2018-01-31 DIAGNOSIS — Z951 Presence of aortocoronary bypass graft: Secondary | ICD-10-CM | POA: Diagnosis not present

## 2018-01-31 DIAGNOSIS — D631 Anemia in chronic kidney disease: Secondary | ICD-10-CM | POA: Diagnosis not present

## 2018-01-31 DIAGNOSIS — E11621 Type 2 diabetes mellitus with foot ulcer: Secondary | ICD-10-CM | POA: Diagnosis not present

## 2018-01-31 DIAGNOSIS — I132 Hypertensive heart and chronic kidney disease with heart failure and with stage 5 chronic kidney disease, or end stage renal disease: Secondary | ICD-10-CM | POA: Diagnosis not present

## 2018-01-31 DIAGNOSIS — I4891 Unspecified atrial fibrillation: Secondary | ICD-10-CM | POA: Diagnosis not present

## 2018-01-31 DIAGNOSIS — I2510 Atherosclerotic heart disease of native coronary artery without angina pectoris: Secondary | ICD-10-CM | POA: Diagnosis not present

## 2018-01-31 DIAGNOSIS — Z87891 Personal history of nicotine dependence: Secondary | ICD-10-CM | POA: Diagnosis not present

## 2018-01-31 DIAGNOSIS — M1990 Unspecified osteoarthritis, unspecified site: Secondary | ICD-10-CM | POA: Diagnosis not present

## 2018-01-31 DIAGNOSIS — J449 Chronic obstructive pulmonary disease, unspecified: Secondary | ICD-10-CM | POA: Diagnosis not present

## 2018-02-03 DIAGNOSIS — L97522 Non-pressure chronic ulcer of other part of left foot with fat layer exposed: Secondary | ICD-10-CM | POA: Diagnosis not present

## 2018-02-03 DIAGNOSIS — E11621 Type 2 diabetes mellitus with foot ulcer: Secondary | ICD-10-CM | POA: Diagnosis not present

## 2018-02-03 DIAGNOSIS — L97822 Non-pressure chronic ulcer of other part of left lower leg with fat layer exposed: Secondary | ICD-10-CM | POA: Diagnosis not present

## 2018-02-03 DIAGNOSIS — I87313 Chronic venous hypertension (idiopathic) with ulcer of bilateral lower extremity: Secondary | ICD-10-CM | POA: Diagnosis not present

## 2018-02-03 DIAGNOSIS — L97812 Non-pressure chronic ulcer of other part of right lower leg with fat layer exposed: Secondary | ICD-10-CM | POA: Diagnosis not present

## 2018-02-04 DIAGNOSIS — L97909 Non-pressure chronic ulcer of unspecified part of unspecified lower leg with unspecified severity: Secondary | ICD-10-CM | POA: Diagnosis not present

## 2018-02-04 DIAGNOSIS — I87313 Chronic venous hypertension (idiopathic) with ulcer of bilateral lower extremity: Secondary | ICD-10-CM | POA: Diagnosis not present

## 2018-02-08 DIAGNOSIS — R197 Diarrhea, unspecified: Secondary | ICD-10-CM | POA: Diagnosis not present

## 2018-02-08 DIAGNOSIS — N184 Chronic kidney disease, stage 4 (severe): Secondary | ICD-10-CM | POA: Diagnosis not present

## 2018-02-08 DIAGNOSIS — E119 Type 2 diabetes mellitus without complications: Secondary | ICD-10-CM | POA: Diagnosis not present

## 2018-02-08 DIAGNOSIS — L97812 Non-pressure chronic ulcer of other part of right lower leg with fat layer exposed: Secondary | ICD-10-CM | POA: Diagnosis not present

## 2018-02-08 DIAGNOSIS — L97822 Non-pressure chronic ulcer of other part of left lower leg with fat layer exposed: Secondary | ICD-10-CM | POA: Diagnosis not present

## 2018-02-08 DIAGNOSIS — Z6824 Body mass index (BMI) 24.0-24.9, adult: Secondary | ICD-10-CM | POA: Diagnosis not present

## 2018-02-08 DIAGNOSIS — I87313 Chronic venous hypertension (idiopathic) with ulcer of bilateral lower extremity: Secondary | ICD-10-CM | POA: Diagnosis not present

## 2018-02-08 DIAGNOSIS — I83019 Varicose veins of right lower extremity with ulcer of unspecified site: Secondary | ICD-10-CM | POA: Diagnosis not present

## 2018-02-09 DIAGNOSIS — R197 Diarrhea, unspecified: Secondary | ICD-10-CM | POA: Diagnosis not present

## 2018-02-10 DIAGNOSIS — N289 Disorder of kidney and ureter, unspecified: Secondary | ICD-10-CM | POA: Diagnosis not present

## 2018-02-10 DIAGNOSIS — Z7902 Long term (current) use of antithrombotics/antiplatelets: Secondary | ICD-10-CM | POA: Diagnosis not present

## 2018-02-10 DIAGNOSIS — R739 Hyperglycemia, unspecified: Secondary | ICD-10-CM | POA: Diagnosis not present

## 2018-02-10 DIAGNOSIS — J449 Chronic obstructive pulmonary disease, unspecified: Secondary | ICD-10-CM | POA: Diagnosis not present

## 2018-02-10 DIAGNOSIS — I252 Old myocardial infarction: Secondary | ICD-10-CM | POA: Diagnosis not present

## 2018-02-10 DIAGNOSIS — N186 End stage renal disease: Secondary | ICD-10-CM | POA: Diagnosis not present

## 2018-02-10 DIAGNOSIS — Z794 Long term (current) use of insulin: Secondary | ICD-10-CM | POA: Diagnosis not present

## 2018-02-10 DIAGNOSIS — L97819 Non-pressure chronic ulcer of other part of right lower leg with unspecified severity: Secondary | ICD-10-CM | POA: Diagnosis not present

## 2018-02-10 DIAGNOSIS — I5030 Unspecified diastolic (congestive) heart failure: Secondary | ICD-10-CM | POA: Diagnosis not present

## 2018-02-10 DIAGNOSIS — D631 Anemia in chronic kidney disease: Secondary | ICD-10-CM | POA: Diagnosis not present

## 2018-02-10 DIAGNOSIS — I482 Chronic atrial fibrillation: Secondary | ICD-10-CM | POA: Diagnosis not present

## 2018-02-10 DIAGNOSIS — I132 Hypertensive heart and chronic kidney disease with heart failure and with stage 5 chronic kidney disease, or end stage renal disease: Secondary | ICD-10-CM | POA: Diagnosis not present

## 2018-02-10 DIAGNOSIS — I2510 Atherosclerotic heart disease of native coronary artery without angina pectoris: Secondary | ICD-10-CM | POA: Diagnosis not present

## 2018-02-10 DIAGNOSIS — I87313 Chronic venous hypertension (idiopathic) with ulcer of bilateral lower extremity: Secondary | ICD-10-CM | POA: Diagnosis not present

## 2018-02-10 DIAGNOSIS — L97212 Non-pressure chronic ulcer of right calf with fat layer exposed: Secondary | ICD-10-CM | POA: Diagnosis not present

## 2018-02-10 DIAGNOSIS — M109 Gout, unspecified: Secondary | ICD-10-CM | POA: Diagnosis not present

## 2018-02-10 DIAGNOSIS — M1990 Unspecified osteoarthritis, unspecified site: Secondary | ICD-10-CM | POA: Diagnosis not present

## 2018-02-10 DIAGNOSIS — G8929 Other chronic pain: Secondary | ICD-10-CM | POA: Diagnosis not present

## 2018-02-10 DIAGNOSIS — Z7982 Long term (current) use of aspirin: Secondary | ICD-10-CM | POA: Diagnosis not present

## 2018-02-10 DIAGNOSIS — L97222 Non-pressure chronic ulcer of left calf with fat layer exposed: Secondary | ICD-10-CM | POA: Diagnosis not present

## 2018-02-10 DIAGNOSIS — L97829 Non-pressure chronic ulcer of other part of left lower leg with unspecified severity: Secondary | ICD-10-CM | POA: Diagnosis not present

## 2018-02-10 DIAGNOSIS — Z87891 Personal history of nicotine dependence: Secondary | ICD-10-CM | POA: Diagnosis not present

## 2018-02-10 DIAGNOSIS — Z7901 Long term (current) use of anticoagulants: Secondary | ICD-10-CM | POA: Diagnosis not present

## 2018-02-10 DIAGNOSIS — Z4800 Encounter for change or removal of nonsurgical wound dressing: Secondary | ICD-10-CM | POA: Diagnosis not present

## 2018-02-10 DIAGNOSIS — I25119 Atherosclerotic heart disease of native coronary artery with unspecified angina pectoris: Secondary | ICD-10-CM | POA: Diagnosis not present

## 2018-02-10 DIAGNOSIS — I4891 Unspecified atrial fibrillation: Secondary | ICD-10-CM | POA: Diagnosis not present

## 2018-02-14 ENCOUNTER — Other Ambulatory Visit (HOSPITAL_COMMUNITY): Payer: Self-pay | Admitting: General Surgery

## 2018-02-14 DIAGNOSIS — I4891 Unspecified atrial fibrillation: Secondary | ICD-10-CM | POA: Diagnosis not present

## 2018-02-14 DIAGNOSIS — M109 Gout, unspecified: Secondary | ICD-10-CM | POA: Diagnosis not present

## 2018-02-14 DIAGNOSIS — I5030 Unspecified diastolic (congestive) heart failure: Secondary | ICD-10-CM | POA: Diagnosis not present

## 2018-02-14 DIAGNOSIS — L97222 Non-pressure chronic ulcer of left calf with fat layer exposed: Secondary | ICD-10-CM | POA: Diagnosis not present

## 2018-02-14 DIAGNOSIS — E119 Type 2 diabetes mellitus without complications: Secondary | ICD-10-CM | POA: Diagnosis not present

## 2018-02-14 DIAGNOSIS — I87311 Chronic venous hypertension (idiopathic) with ulcer of right lower extremity: Secondary | ICD-10-CM | POA: Diagnosis not present

## 2018-02-14 DIAGNOSIS — L97212 Non-pressure chronic ulcer of right calf with fat layer exposed: Secondary | ICD-10-CM | POA: Diagnosis not present

## 2018-02-14 DIAGNOSIS — J449 Chronic obstructive pulmonary disease, unspecified: Secondary | ICD-10-CM | POA: Diagnosis not present

## 2018-02-14 DIAGNOSIS — I87313 Chronic venous hypertension (idiopathic) with ulcer of bilateral lower extremity: Secondary | ICD-10-CM | POA: Diagnosis not present

## 2018-02-14 DIAGNOSIS — I872 Venous insufficiency (chronic) (peripheral): Secondary | ICD-10-CM | POA: Diagnosis not present

## 2018-02-14 DIAGNOSIS — I252 Old myocardial infarction: Secondary | ICD-10-CM | POA: Diagnosis not present

## 2018-02-14 DIAGNOSIS — L97812 Non-pressure chronic ulcer of other part of right lower leg with fat layer exposed: Secondary | ICD-10-CM | POA: Diagnosis not present

## 2018-02-16 DIAGNOSIS — I252 Old myocardial infarction: Secondary | ICD-10-CM | POA: Diagnosis not present

## 2018-02-16 DIAGNOSIS — I87313 Chronic venous hypertension (idiopathic) with ulcer of bilateral lower extremity: Secondary | ICD-10-CM | POA: Diagnosis not present

## 2018-02-16 DIAGNOSIS — L97222 Non-pressure chronic ulcer of left calf with fat layer exposed: Secondary | ICD-10-CM | POA: Diagnosis not present

## 2018-02-16 DIAGNOSIS — M109 Gout, unspecified: Secondary | ICD-10-CM | POA: Diagnosis not present

## 2018-02-16 DIAGNOSIS — L97212 Non-pressure chronic ulcer of right calf with fat layer exposed: Secondary | ICD-10-CM | POA: Diagnosis not present

## 2018-02-16 DIAGNOSIS — J449 Chronic obstructive pulmonary disease, unspecified: Secondary | ICD-10-CM | POA: Diagnosis not present

## 2018-02-16 DIAGNOSIS — I4891 Unspecified atrial fibrillation: Secondary | ICD-10-CM | POA: Diagnosis not present

## 2018-02-18 DIAGNOSIS — I4891 Unspecified atrial fibrillation: Secondary | ICD-10-CM | POA: Diagnosis not present

## 2018-02-18 DIAGNOSIS — I252 Old myocardial infarction: Secondary | ICD-10-CM | POA: Diagnosis not present

## 2018-02-18 DIAGNOSIS — I87313 Chronic venous hypertension (idiopathic) with ulcer of bilateral lower extremity: Secondary | ICD-10-CM | POA: Diagnosis not present

## 2018-02-18 DIAGNOSIS — L97222 Non-pressure chronic ulcer of left calf with fat layer exposed: Secondary | ICD-10-CM | POA: Diagnosis not present

## 2018-02-18 DIAGNOSIS — L97212 Non-pressure chronic ulcer of right calf with fat layer exposed: Secondary | ICD-10-CM | POA: Diagnosis not present

## 2018-02-18 DIAGNOSIS — M109 Gout, unspecified: Secondary | ICD-10-CM | POA: Diagnosis not present

## 2018-02-18 DIAGNOSIS — J449 Chronic obstructive pulmonary disease, unspecified: Secondary | ICD-10-CM | POA: Diagnosis not present

## 2018-02-23 DIAGNOSIS — L97222 Non-pressure chronic ulcer of left calf with fat layer exposed: Secondary | ICD-10-CM | POA: Diagnosis not present

## 2018-02-23 DIAGNOSIS — I252 Old myocardial infarction: Secondary | ICD-10-CM | POA: Diagnosis not present

## 2018-02-23 DIAGNOSIS — M109 Gout, unspecified: Secondary | ICD-10-CM | POA: Diagnosis not present

## 2018-02-23 DIAGNOSIS — J449 Chronic obstructive pulmonary disease, unspecified: Secondary | ICD-10-CM | POA: Diagnosis not present

## 2018-02-23 DIAGNOSIS — I4891 Unspecified atrial fibrillation: Secondary | ICD-10-CM | POA: Diagnosis not present

## 2018-02-23 DIAGNOSIS — L97212 Non-pressure chronic ulcer of right calf with fat layer exposed: Secondary | ICD-10-CM | POA: Diagnosis not present

## 2018-02-23 DIAGNOSIS — I87313 Chronic venous hypertension (idiopathic) with ulcer of bilateral lower extremity: Secondary | ICD-10-CM | POA: Diagnosis not present

## 2018-02-24 DIAGNOSIS — E119 Type 2 diabetes mellitus without complications: Secondary | ICD-10-CM | POA: Diagnosis not present

## 2018-02-24 DIAGNOSIS — L97822 Non-pressure chronic ulcer of other part of left lower leg with fat layer exposed: Secondary | ICD-10-CM | POA: Diagnosis not present

## 2018-02-24 DIAGNOSIS — I87312 Chronic venous hypertension (idiopathic) with ulcer of left lower extremity: Secondary | ICD-10-CM | POA: Diagnosis not present

## 2018-02-28 DIAGNOSIS — L97812 Non-pressure chronic ulcer of other part of right lower leg with fat layer exposed: Secondary | ICD-10-CM | POA: Diagnosis not present

## 2018-02-28 DIAGNOSIS — I5030 Unspecified diastolic (congestive) heart failure: Secondary | ICD-10-CM | POA: Diagnosis not present

## 2018-02-28 DIAGNOSIS — L97822 Non-pressure chronic ulcer of other part of left lower leg with fat layer exposed: Secondary | ICD-10-CM | POA: Diagnosis not present

## 2018-02-28 DIAGNOSIS — E119 Type 2 diabetes mellitus without complications: Secondary | ICD-10-CM | POA: Diagnosis not present

## 2018-02-28 DIAGNOSIS — J449 Chronic obstructive pulmonary disease, unspecified: Secondary | ICD-10-CM | POA: Diagnosis not present

## 2018-02-28 DIAGNOSIS — I87313 Chronic venous hypertension (idiopathic) with ulcer of bilateral lower extremity: Secondary | ICD-10-CM | POA: Diagnosis not present

## 2018-03-07 DIAGNOSIS — E119 Type 2 diabetes mellitus without complications: Secondary | ICD-10-CM | POA: Diagnosis not present

## 2018-03-07 DIAGNOSIS — I87313 Chronic venous hypertension (idiopathic) with ulcer of bilateral lower extremity: Secondary | ICD-10-CM | POA: Diagnosis not present

## 2018-03-07 DIAGNOSIS — L97812 Non-pressure chronic ulcer of other part of right lower leg with fat layer exposed: Secondary | ICD-10-CM | POA: Diagnosis not present

## 2018-03-07 DIAGNOSIS — L97822 Non-pressure chronic ulcer of other part of left lower leg with fat layer exposed: Secondary | ICD-10-CM | POA: Diagnosis not present

## 2018-03-07 DIAGNOSIS — I5030 Unspecified diastolic (congestive) heart failure: Secondary | ICD-10-CM | POA: Diagnosis not present

## 2018-03-07 DIAGNOSIS — J449 Chronic obstructive pulmonary disease, unspecified: Secondary | ICD-10-CM | POA: Diagnosis not present

## 2018-03-07 DIAGNOSIS — R269 Unspecified abnormalities of gait and mobility: Secondary | ICD-10-CM | POA: Diagnosis not present

## 2018-03-07 DIAGNOSIS — I87333 Chronic venous hypertension (idiopathic) with ulcer and inflammation of bilateral lower extremity: Secondary | ICD-10-CM | POA: Diagnosis not present

## 2018-03-08 DIAGNOSIS — I87313 Chronic venous hypertension (idiopathic) with ulcer of bilateral lower extremity: Secondary | ICD-10-CM | POA: Diagnosis not present

## 2018-03-08 DIAGNOSIS — I87319 Chronic venous hypertension (idiopathic) with ulcer of unspecified lower extremity: Secondary | ICD-10-CM | POA: Diagnosis not present

## 2018-03-15 DIAGNOSIS — L97212 Non-pressure chronic ulcer of right calf with fat layer exposed: Secondary | ICD-10-CM | POA: Diagnosis not present

## 2018-03-15 DIAGNOSIS — I252 Old myocardial infarction: Secondary | ICD-10-CM | POA: Diagnosis not present

## 2018-03-15 DIAGNOSIS — I87313 Chronic venous hypertension (idiopathic) with ulcer of bilateral lower extremity: Secondary | ICD-10-CM | POA: Diagnosis not present

## 2018-03-15 DIAGNOSIS — I4891 Unspecified atrial fibrillation: Secondary | ICD-10-CM | POA: Diagnosis not present

## 2018-03-15 DIAGNOSIS — L97222 Non-pressure chronic ulcer of left calf with fat layer exposed: Secondary | ICD-10-CM | POA: Diagnosis not present

## 2018-03-15 DIAGNOSIS — M109 Gout, unspecified: Secondary | ICD-10-CM | POA: Diagnosis not present

## 2018-03-15 DIAGNOSIS — J449 Chronic obstructive pulmonary disease, unspecified: Secondary | ICD-10-CM | POA: Diagnosis not present

## 2018-03-16 DIAGNOSIS — R269 Unspecified abnormalities of gait and mobility: Secondary | ICD-10-CM | POA: Diagnosis not present

## 2018-03-16 DIAGNOSIS — I87333 Chronic venous hypertension (idiopathic) with ulcer and inflammation of bilateral lower extremity: Secondary | ICD-10-CM | POA: Diagnosis not present

## 2018-03-18 DIAGNOSIS — I252 Old myocardial infarction: Secondary | ICD-10-CM | POA: Diagnosis not present

## 2018-03-18 DIAGNOSIS — M109 Gout, unspecified: Secondary | ICD-10-CM | POA: Diagnosis not present

## 2018-03-18 DIAGNOSIS — L97212 Non-pressure chronic ulcer of right calf with fat layer exposed: Secondary | ICD-10-CM | POA: Diagnosis not present

## 2018-03-18 DIAGNOSIS — I4891 Unspecified atrial fibrillation: Secondary | ICD-10-CM | POA: Diagnosis not present

## 2018-03-18 DIAGNOSIS — I87313 Chronic venous hypertension (idiopathic) with ulcer of bilateral lower extremity: Secondary | ICD-10-CM | POA: Diagnosis not present

## 2018-03-18 DIAGNOSIS — J449 Chronic obstructive pulmonary disease, unspecified: Secondary | ICD-10-CM | POA: Diagnosis not present

## 2018-03-18 DIAGNOSIS — L97222 Non-pressure chronic ulcer of left calf with fat layer exposed: Secondary | ICD-10-CM | POA: Diagnosis not present

## 2018-03-20 DIAGNOSIS — I25810 Atherosclerosis of coronary artery bypass graft(s) without angina pectoris: Secondary | ICD-10-CM | POA: Diagnosis not present

## 2018-03-20 DIAGNOSIS — E1122 Type 2 diabetes mellitus with diabetic chronic kidney disease: Secondary | ICD-10-CM | POA: Diagnosis not present

## 2018-03-20 DIAGNOSIS — R1111 Vomiting without nausea: Secondary | ICD-10-CM | POA: Diagnosis not present

## 2018-03-20 DIAGNOSIS — R531 Weakness: Secondary | ICD-10-CM | POA: Diagnosis not present

## 2018-03-20 DIAGNOSIS — R197 Diarrhea, unspecified: Secondary | ICD-10-CM | POA: Diagnosis not present

## 2018-03-20 DIAGNOSIS — Z955 Presence of coronary angioplasty implant and graft: Secondary | ICD-10-CM | POA: Diagnosis not present

## 2018-03-20 DIAGNOSIS — N189 Chronic kidney disease, unspecified: Secondary | ICD-10-CM | POA: Diagnosis not present

## 2018-03-20 DIAGNOSIS — I129 Hypertensive chronic kidney disease with stage 1 through stage 4 chronic kidney disease, or unspecified chronic kidney disease: Secondary | ICD-10-CM | POA: Diagnosis not present

## 2018-03-20 DIAGNOSIS — J069 Acute upper respiratory infection, unspecified: Secondary | ICD-10-CM | POA: Diagnosis not present

## 2018-03-20 DIAGNOSIS — Z9981 Dependence on supplemental oxygen: Secondary | ICD-10-CM | POA: Diagnosis not present

## 2018-03-20 DIAGNOSIS — R0602 Shortness of breath: Secondary | ICD-10-CM | POA: Diagnosis not present

## 2018-03-20 DIAGNOSIS — J449 Chronic obstructive pulmonary disease, unspecified: Secondary | ICD-10-CM | POA: Diagnosis not present

## 2018-03-20 DIAGNOSIS — R05 Cough: Secondary | ICD-10-CM | POA: Diagnosis not present

## 2018-03-21 DIAGNOSIS — E119 Type 2 diabetes mellitus without complications: Secondary | ICD-10-CM | POA: Diagnosis not present

## 2018-03-21 DIAGNOSIS — I87313 Chronic venous hypertension (idiopathic) with ulcer of bilateral lower extremity: Secondary | ICD-10-CM | POA: Diagnosis not present

## 2018-03-21 DIAGNOSIS — L97822 Non-pressure chronic ulcer of other part of left lower leg with fat layer exposed: Secondary | ICD-10-CM | POA: Diagnosis not present

## 2018-03-21 DIAGNOSIS — L97812 Non-pressure chronic ulcer of other part of right lower leg with fat layer exposed: Secondary | ICD-10-CM | POA: Diagnosis not present

## 2018-03-22 ENCOUNTER — Other Ambulatory Visit: Payer: Self-pay

## 2018-03-22 DIAGNOSIS — I509 Heart failure, unspecified: Secondary | ICD-10-CM | POA: Diagnosis not present

## 2018-03-22 DIAGNOSIS — G2581 Restless legs syndrome: Secondary | ICD-10-CM | POA: Diagnosis not present

## 2018-03-22 DIAGNOSIS — I10 Essential (primary) hypertension: Secondary | ICD-10-CM | POA: Diagnosis not present

## 2018-03-22 DIAGNOSIS — K210 Gastro-esophageal reflux disease with esophagitis: Secondary | ICD-10-CM | POA: Diagnosis not present

## 2018-03-22 DIAGNOSIS — N184 Chronic kidney disease, stage 4 (severe): Secondary | ICD-10-CM | POA: Diagnosis not present

## 2018-03-22 DIAGNOSIS — I83019 Varicose veins of right lower extremity with ulcer of unspecified site: Secondary | ICD-10-CM | POA: Diagnosis not present

## 2018-03-22 DIAGNOSIS — J449 Chronic obstructive pulmonary disease, unspecified: Secondary | ICD-10-CM | POA: Diagnosis not present

## 2018-03-22 DIAGNOSIS — M109 Gout, unspecified: Secondary | ICD-10-CM | POA: Diagnosis not present

## 2018-03-22 DIAGNOSIS — I4891 Unspecified atrial fibrillation: Secondary | ICD-10-CM | POA: Diagnosis not present

## 2018-03-22 DIAGNOSIS — I2510 Atherosclerotic heart disease of native coronary artery without angina pectoris: Secondary | ICD-10-CM | POA: Diagnosis not present

## 2018-03-22 DIAGNOSIS — E785 Hyperlipidemia, unspecified: Secondary | ICD-10-CM | POA: Diagnosis not present

## 2018-03-22 DIAGNOSIS — J189 Pneumonia, unspecified organism: Secondary | ICD-10-CM | POA: Diagnosis not present

## 2018-03-22 NOTE — Patient Outreach (Signed)
[facility]) Care Management  03/22/2018  HUSAIN COSTABILE September 05, 1941 [id number]   Referral Date: 03/22/18 Referral Source: [facility] Referral Reason: UM questioning homebound status   Outreach Attempt #1 Telephone call to patient.  Patient states he is in the doctor's office.  Advised patient that CM would call another time.  He verbalized understanding.     Plan: RN CM will send letter and attempt patient again within 4 business days.     Jone Baseman, RN, MSN [facility] Care Management Care Management Coordinator Direct Line [phone] Toll Free: [phone]  Fax: [phone]

## 2018-03-23 ENCOUNTER — Other Ambulatory Visit: Payer: Self-pay

## 2018-03-23 ENCOUNTER — Encounter: Payer: Self-pay | Admitting: Gastroenterology

## 2018-03-23 NOTE — Patient Outreach (Signed)
[name]) Care Management  03/23/2018  Kyle [name] [birth year] [id number]   Referral Date: 03/22/18 Referral Source: [facility] Referral Reason: UM questioning homebound status    Outreach Attempt #2 Spoke with patient.  He requests that CM speak with his spouse. Spoke with his wife.  She is able to verify HIPAA.  She states that patient is doing better.  She shares that the patient has a cold and recently went to the ER because he could not breath.  She states he was not kept but that patient followed up with his PCP on yesterday.  She states that he was given 2 shots and then prescribed medication for congestion.  She is not able to name the medications.    Social: Patient lives in the home with spouse and their daughter lives very close by and offers help and support almost daily.  Wife reports that patient is able to bath himself but patient needs assistance with iADL's due to leg swelling and cumbersome dressings on his legs.     Conditions: Wife reports that patient has HF, DM, HTN, Hyperlipidemia, CAD, and some renal failure.  She states that patient weighs daily, watches his salt intake, and know when to notify physician for problems.  She reports however that his sugars have been in the 200-400 range and that Dr. Micheal Likens made some changes with his medications yesterday in hopes that his sugars would be better controlled.  She states that patient has had plenty of education on his diabetes and knows what to eat.  She is not sure of his A1c but states he has blood work coming up.  She states that [facility] comes on Wednesday and Friday for dressing changes due to ulcers on his legs. She states that they are much better since patient started Cipro. She also states that she takes him to the wound care center on Mondays to see Dr. Jerilee Hoh.     Medications: Patient takes medications and is able to afford medications.   Appointments:  Patient saw PCP on  yesterday.  Wife takes patient to appointments.     Advanced Directives: Patient does have an advanced directive.    Consent: Discussed with wife [facility] services and how we can be of support to patient and family.  She declined services as she states that home health is very thorough in their care and provide support and education presently. Advised wife that letter and brochure was sent for review and future reference.  She verbalized understanding.   Plan: RN CM will close case.    Jone Baseman, RN, MSN [facility] Care Management Care Management Coordinator Direct Line [phone] Toll Free: [phone]  Fax: [phone]

## 2018-03-30 ENCOUNTER — Emergency Department (HOSPITAL_COMMUNITY): Payer: PPO

## 2018-03-30 ENCOUNTER — Inpatient Hospital Stay (HOSPITAL_COMMUNITY)
Admission: EM | Admit: 2018-03-30 | Discharge: 2018-03-31 | DRG: 638 | Disposition: A | Discharge status: Home / Self Care | Payer: PPO | Attending: Family Medicine | Admitting: Family Medicine

## 2018-03-30 ENCOUNTER — Other Ambulatory Visit: Payer: Self-pay

## 2018-03-30 ENCOUNTER — Encounter (HOSPITAL_COMMUNITY): Payer: Self-pay | Admitting: Nurse Practitioner

## 2018-03-30 DIAGNOSIS — Z955 Presence of coronary angioplasty implant and graft: Secondary | ICD-10-CM

## 2018-03-30 DIAGNOSIS — E1101 Type 2 diabetes mellitus with hyperosmolarity with coma: Secondary | ICD-10-CM | POA: Diagnosis present

## 2018-03-30 DIAGNOSIS — R350 Frequency of micturition: Secondary | ICD-10-CM | POA: Diagnosis not present

## 2018-03-30 DIAGNOSIS — E1100 Type 2 diabetes mellitus with hyperosmolarity without nonketotic hyperglycemic-hyperosmolar coma (NKHHC): Secondary | ICD-10-CM | POA: Diagnosis not present

## 2018-03-30 DIAGNOSIS — J449 Chronic obstructive pulmonary disease, unspecified: Secondary | ICD-10-CM | POA: Diagnosis not present

## 2018-03-30 DIAGNOSIS — D649 Anemia, unspecified: Secondary | ICD-10-CM | POA: Diagnosis present

## 2018-03-30 DIAGNOSIS — E118 Type 2 diabetes mellitus with unspecified complications: Secondary | ICD-10-CM

## 2018-03-30 DIAGNOSIS — Z9981 Dependence on supplemental oxygen: Secondary | ICD-10-CM

## 2018-03-30 DIAGNOSIS — R413 Other amnesia: Secondary | ICD-10-CM | POA: Diagnosis present

## 2018-03-30 DIAGNOSIS — E1165 Type 2 diabetes mellitus with hyperglycemia: Secondary | ICD-10-CM | POA: Diagnosis present

## 2018-03-30 DIAGNOSIS — I340 Nonrheumatic mitral (valve) insufficiency: Secondary | ICD-10-CM | POA: Diagnosis present

## 2018-03-30 DIAGNOSIS — Y636 Underdosing and nonadministration of necessary drug, medicament or biological substance: Secondary | ICD-10-CM | POA: Diagnosis present

## 2018-03-30 DIAGNOSIS — N171 Acute kidney failure with acute cortical necrosis: Secondary | ICD-10-CM

## 2018-03-30 DIAGNOSIS — M109 Gout, unspecified: Secondary | ICD-10-CM | POA: Diagnosis present

## 2018-03-30 DIAGNOSIS — G2581 Restless legs syndrome: Secondary | ICD-10-CM | POA: Diagnosis present

## 2018-03-30 DIAGNOSIS — E1122 Type 2 diabetes mellitus with diabetic chronic kidney disease: Secondary | ICD-10-CM | POA: Diagnosis not present

## 2018-03-30 DIAGNOSIS — N184 Chronic kidney disease, stage 4 (severe): Secondary | ICD-10-CM | POA: Diagnosis present

## 2018-03-30 DIAGNOSIS — R739 Hyperglycemia, unspecified: Secondary | ICD-10-CM | POA: Diagnosis not present

## 2018-03-30 DIAGNOSIS — D473 Essential (hemorrhagic) thrombocythemia: Secondary | ICD-10-CM | POA: Diagnosis present

## 2018-03-30 DIAGNOSIS — Z961 Presence of intraocular lens: Secondary | ICD-10-CM | POA: Diagnosis present

## 2018-03-30 DIAGNOSIS — F418 Other specified anxiety disorders: Secondary | ICD-10-CM | POA: Diagnosis present

## 2018-03-30 DIAGNOSIS — IMO0002 Reserved for concepts with insufficient information to code with codable children: Secondary | ICD-10-CM | POA: Diagnosis present

## 2018-03-30 DIAGNOSIS — I252 Old myocardial infarction: Secondary | ICD-10-CM | POA: Diagnosis not present

## 2018-03-30 DIAGNOSIS — K219 Gastro-esophageal reflux disease without esophagitis: Secondary | ICD-10-CM | POA: Diagnosis not present

## 2018-03-30 DIAGNOSIS — I160 Hypertensive urgency: Secondary | ICD-10-CM | POA: Diagnosis not present

## 2018-03-30 DIAGNOSIS — I130 Hypertensive heart and chronic kidney disease with heart failure and stage 1 through stage 4 chronic kidney disease, or unspecified chronic kidney disease: Secondary | ICD-10-CM | POA: Diagnosis present

## 2018-03-30 DIAGNOSIS — Z951 Presence of aortocoronary bypass graft: Secondary | ICD-10-CM

## 2018-03-30 DIAGNOSIS — G8929 Other chronic pain: Secondary | ICD-10-CM | POA: Diagnosis present

## 2018-03-30 DIAGNOSIS — D509 Iron deficiency anemia, unspecified: Secondary | ICD-10-CM | POA: Diagnosis present

## 2018-03-30 DIAGNOSIS — I5022 Chronic systolic (congestive) heart failure: Secondary | ICD-10-CM | POA: Diagnosis not present

## 2018-03-30 DIAGNOSIS — I255 Ischemic cardiomyopathy: Secondary | ICD-10-CM | POA: Diagnosis present

## 2018-03-30 DIAGNOSIS — R05 Cough: Secondary | ICD-10-CM | POA: Diagnosis not present

## 2018-03-30 DIAGNOSIS — D638 Anemia in other chronic diseases classified elsewhere: Secondary | ICD-10-CM | POA: Diagnosis not present

## 2018-03-30 DIAGNOSIS — I10 Essential (primary) hypertension: Secondary | ICD-10-CM | POA: Diagnosis present

## 2018-03-30 DIAGNOSIS — E785 Hyperlipidemia, unspecified: Secondary | ICD-10-CM | POA: Diagnosis not present

## 2018-03-30 DIAGNOSIS — K921 Melena: Secondary | ICD-10-CM | POA: Diagnosis not present

## 2018-03-30 DIAGNOSIS — Z833 Family history of diabetes mellitus: Secondary | ICD-10-CM

## 2018-03-30 DIAGNOSIS — Z7982 Long term (current) use of aspirin: Secondary | ICD-10-CM

## 2018-03-30 DIAGNOSIS — R748 Abnormal levels of other serum enzymes: Secondary | ICD-10-CM

## 2018-03-30 DIAGNOSIS — Z9842 Cataract extraction status, left eye: Secondary | ICD-10-CM

## 2018-03-30 DIAGNOSIS — Z9841 Cataract extraction status, right eye: Secondary | ICD-10-CM

## 2018-03-30 DIAGNOSIS — Z794 Long term (current) use of insulin: Secondary | ICD-10-CM

## 2018-03-30 DIAGNOSIS — I480 Paroxysmal atrial fibrillation: Secondary | ICD-10-CM | POA: Diagnosis present

## 2018-03-30 DIAGNOSIS — Z7901 Long term (current) use of anticoagulants: Secondary | ICD-10-CM

## 2018-03-30 DIAGNOSIS — N179 Acute kidney failure, unspecified: Secondary | ICD-10-CM | POA: Diagnosis not present

## 2018-03-30 DIAGNOSIS — Z8701 Personal history of pneumonia (recurrent): Secondary | ICD-10-CM

## 2018-03-30 DIAGNOSIS — D75839 Thrombocytosis, unspecified: Secondary | ICD-10-CM | POA: Diagnosis present

## 2018-03-30 DIAGNOSIS — Z8249 Family history of ischemic heart disease and other diseases of the circulatory system: Secondary | ICD-10-CM

## 2018-03-30 DIAGNOSIS — I5042 Chronic combined systolic (congestive) and diastolic (congestive) heart failure: Secondary | ICD-10-CM | POA: Diagnosis present

## 2018-03-30 DIAGNOSIS — D631 Anemia in chronic kidney disease: Secondary | ICD-10-CM | POA: Diagnosis not present

## 2018-03-30 DIAGNOSIS — Z7902 Long term (current) use of antithrombotics/antiplatelets: Secondary | ICD-10-CM

## 2018-03-30 DIAGNOSIS — I4891 Unspecified atrial fibrillation: Secondary | ICD-10-CM | POA: Diagnosis present

## 2018-03-30 DIAGNOSIS — D72829 Elevated white blood cell count, unspecified: Secondary | ICD-10-CM | POA: Diagnosis present

## 2018-03-30 DIAGNOSIS — N401 Enlarged prostate with lower urinary tract symptoms: Secondary | ICD-10-CM | POA: Diagnosis not present

## 2018-03-30 DIAGNOSIS — I2510 Atherosclerotic heart disease of native coronary artery without angina pectoris: Secondary | ICD-10-CM | POA: Diagnosis present

## 2018-03-30 DIAGNOSIS — Z79899 Other long term (current) drug therapy: Secondary | ICD-10-CM

## 2018-03-30 DIAGNOSIS — R7989 Other specified abnormal findings of blood chemistry: Secondary | ICD-10-CM | POA: Diagnosis present

## 2018-03-30 DIAGNOSIS — I482 Chronic atrial fibrillation, unspecified: Secondary | ICD-10-CM | POA: Diagnosis present

## 2018-03-30 DIAGNOSIS — Z87891 Personal history of nicotine dependence: Secondary | ICD-10-CM

## 2018-03-30 DIAGNOSIS — T380X5A Adverse effect of glucocorticoids and synthetic analogues, initial encounter: Secondary | ICD-10-CM | POA: Diagnosis present

## 2018-03-30 DIAGNOSIS — R531 Weakness: Secondary | ICD-10-CM | POA: Diagnosis not present

## 2018-03-30 DIAGNOSIS — I959 Hypotension, unspecified: Secondary | ICD-10-CM | POA: Diagnosis not present

## 2018-03-30 DIAGNOSIS — R778 Other specified abnormalities of plasma proteins: Secondary | ICD-10-CM | POA: Diagnosis present

## 2018-03-30 HISTORY — DX: Type 2 diabetes mellitus with hyperosmolarity without nonketotic hyperglycemic-hyperosmolar coma (NKHHC): E11.00

## 2018-03-30 HISTORY — DX: Nonrheumatic mitral (valve) insufficiency: I34.0

## 2018-03-30 HISTORY — DX: Chronic combined systolic (congestive) and diastolic (congestive) heart failure: I50.42

## 2018-03-30 HISTORY — DX: Chronic obstructive pulmonary disease, unspecified: J44.9

## 2018-03-30 LAB — I-STAT VENOUS BLOOD GAS, ED
Acid-base deficit: 2 mmol/L (ref 0.0–2.0)
Bicarbonate: 21.7 mmol/L (ref 20.0–28.0)
O2 Saturation: 48 %
TCO2: 23 mmol/L (ref 22–32)
pCO2, Ven: 34 mmHg — ABNORMAL LOW (ref 44.0–60.0)
pH, Ven: 7.414 (ref 7.250–7.430)
pO2, Ven: 25 mmHg — CL (ref 32.0–45.0)

## 2018-03-30 LAB — MAGNESIUM: Magnesium: 2.1 mg/dL (ref 1.7–2.4)

## 2018-03-30 LAB — URINALYSIS, ROUTINE W REFLEX MICROSCOPIC
Bacteria, UA: NONE SEEN
Bilirubin Urine: NEGATIVE
Hgb urine dipstick: NEGATIVE
Ketones, ur: NEGATIVE mg/dL
LEUKOCYTES UA: NEGATIVE
NITRITE: NEGATIVE
PROTEIN: 30 mg/dL — AB
Specific Gravity, Urine: 1.009 (ref 1.005–1.030)
pH: 7 (ref 5.0–8.0)

## 2018-03-30 LAB — CBC WITH DIFFERENTIAL/PLATELET
Abs Immature Granulocytes: 0.1 10*3/uL (ref 0.0–0.1)
Basophils Absolute: 0 10*3/uL (ref 0.0–0.1)
Basophils Relative: 0 %
EOS ABS: 0 10*3/uL (ref 0.0–0.7)
Eosinophils Relative: 0 %
HEMATOCRIT: 22.9 % — AB (ref 39.0–52.0)
HEMOGLOBIN: 7.6 g/dL — AB (ref 13.0–17.0)
IMMATURE GRANULOCYTES: 1 %
LYMPHS PCT: 5 %
Lymphs Abs: 0.7 10*3/uL (ref 0.7–4.0)
MCH: 30 pg (ref 26.0–34.0)
MCHC: 33.2 g/dL (ref 30.0–36.0)
MCV: 90.5 fL (ref 78.0–100.0)
Monocytes Absolute: 0.8 10*3/uL (ref 0.1–1.0)
Monocytes Relative: 5 %
NEUTROS PCT: 89 %
Neutro Abs: 13.2 10*3/uL — ABNORMAL HIGH (ref 1.7–7.7)
Platelets: 321 10*3/uL (ref 150–400)
RBC: 2.53 MIL/uL — AB (ref 4.22–5.81)
RDW: 14.6 % (ref 11.5–15.5)
WBC: 14.8 10*3/uL — AB (ref 4.0–10.5)

## 2018-03-30 LAB — I-STAT TROPONIN, ED: Troponin i, poc: 0.09 ng/mL (ref 0.00–0.08)

## 2018-03-30 LAB — BASIC METABOLIC PANEL
Anion gap: 15 (ref 5–15)
Anion gap: 13 (ref 5–15)
BUN: 130 mg/dL — AB (ref 8–23)
BUN: 129 mg/dL — AB (ref 8–23)
CALCIUM: 9.1 mg/dL (ref 8.9–10.3)
CALCIUM: 9.1 mg/dL (ref 8.9–10.3)
CO2: 22 mmol/L (ref 22–32)
CO2: 22 mmol/L (ref 22–32)
Chloride: 94 mmol/L — ABNORMAL LOW (ref 98–111)
Chloride: 97 mmol/L — ABNORMAL LOW (ref 98–111)
Creatinine, Ser: 3.66 mg/dL — ABNORMAL HIGH (ref 0.61–1.24)
Creatinine, Ser: 3.48 mg/dL — ABNORMAL HIGH (ref 0.61–1.24)
GFR calc Af Amer: 17 mL/min — ABNORMAL LOW (ref >60)
GFR calc Af Amer: 18 mL/min — ABNORMAL LOW (ref >60)
GFR, EST NON AFRICAN AMERICAN: 15 mL/min — AB (ref >60)
GFR, EST NON AFRICAN AMERICAN: 15 mL/min — AB (ref >60)
Glucose, Bld: 394 mg/dL — ABNORMAL HIGH (ref 70–99)
Glucose, Bld: 174 mg/dL — ABNORMAL HIGH (ref 70–99)
POTASSIUM: 4.3 mmol/L (ref 3.5–5.1)
POTASSIUM: 4.4 mmol/L (ref 3.5–5.1)
SODIUM: 131 mmol/L — AB (ref 135–145)
SODIUM: 132 mmol/L — AB (ref 135–145)

## 2018-03-30 LAB — COMPREHENSIVE METABOLIC PANEL WITH GFR
ALT: 43 U/L (ref 0–44)
AST: 14 U/L — ABNORMAL LOW (ref 15–41)
Albumin: 3 g/dL — ABNORMAL LOW (ref 3.5–5.0)
Alkaline Phosphatase: 68 U/L (ref 38–126)
Anion gap: 10 (ref 5–15)
BUN: 131 mg/dL — ABNORMAL HIGH (ref 8–23)
CO2: 23 mmol/L (ref 22–32)
Calcium: 8.4 mg/dL — ABNORMAL LOW (ref 8.9–10.3)
Chloride: 90 mmol/L — ABNORMAL LOW (ref 98–111)
Creatinine, Ser: 3.77 mg/dL — ABNORMAL HIGH (ref 0.61–1.24)
GFR calc Af Amer: 16 mL/min — ABNORMAL LOW
GFR calc non Af Amer: 14 mL/min — ABNORMAL LOW
Glucose, Bld: 787 mg/dL (ref 70–99)
Potassium: 5.5 mmol/L — ABNORMAL HIGH (ref 3.5–5.1)
Sodium: 123 mmol/L — ABNORMAL LOW (ref 135–145)
Total Bilirubin: 0.8 mg/dL (ref 0.3–1.2)
Total Protein: 5.1 g/dL — ABNORMAL LOW (ref 6.5–8.1)

## 2018-03-30 LAB — CBG MONITORING, ED
GLUCOSE-CAPILLARY: 280 mg/dL — AB (ref 70–99)
Glucose-Capillary: >600 mg/dL (ref 70–99)
Glucose-Capillary: >600 mg/dL (ref 70–99)
Glucose-Capillary: 406 mg/dL — ABNORMAL HIGH (ref 70–99)

## 2018-03-30 LAB — TROPONIN I: TROPONIN I: 0.11 ng/mL — AB (ref <0.03)

## 2018-03-30 LAB — GLUCOSE, CAPILLARY
Glucose-Capillary: 157 mg/dL — ABNORMAL HIGH (ref 70–99)
Glucose-Capillary: 134 mg/dL — ABNORMAL HIGH (ref 70–99)

## 2018-03-30 LAB — PHOSPHORUS: PHOSPHORUS: 3.1 mg/dL (ref 2.5–4.6)

## 2018-03-30 LAB — MRSA PCR SCREENING: MRSA by PCR: NEGATIVE

## 2018-03-30 MED ORDER — ASPIRIN EC 81 MG PO TBEC
81.0000 mg | DELAYED_RELEASE_TABLET | Freq: Every evening | ORAL | Status: DC
  Administered 2018-03-30: 81 mg via ORAL
  Filled 2018-03-30: qty 1

## 2018-03-30 MED ORDER — CLOPIDOGREL BISULFATE 75 MG PO TABS
75.0000 mg | ORAL_TABLET | Freq: Every day | ORAL | Status: DC
  Administered 2018-03-30 – 2018-03-31 (×2): 75 mg via ORAL
  Filled 2018-03-30 (×2): qty 1

## 2018-03-30 MED ORDER — ISOSORBIDE MONONITRATE ER 30 MG PO TB24
30.0000 mg | ORAL_TABLET | Freq: Three times a day (TID) | ORAL | Status: DC
  Administered 2018-03-30 – 2018-03-31 (×3): 30 mg via ORAL
  Filled 2018-03-30 (×3): qty 1

## 2018-03-30 MED ORDER — AMLODIPINE BESYLATE 5 MG PO TABS
5.0000 mg | ORAL_TABLET | Freq: Every evening | ORAL | Status: DC
  Administered 2018-03-30: 5 mg via ORAL
  Filled 2018-03-30: qty 1

## 2018-03-30 MED ORDER — ROPINIROLE HCL 1 MG PO TABS
2.0000 mg | ORAL_TABLET | Freq: Every day | ORAL | Status: DC
  Administered 2018-03-30: 2 mg via ORAL
  Filled 2018-03-30: qty 4

## 2018-03-30 MED ORDER — CALCIUM CARBONATE 1250 (500 CA) MG PO TABS
1.0000 | ORAL_TABLET | Freq: Every day | ORAL | Status: DC
  Administered 2018-03-31: 500 mg via ORAL
  Filled 2018-03-30: qty 1

## 2018-03-30 MED ORDER — APIXABAN 2.5 MG PO TABS: 2.5000 mg | ORAL_TABLET | Freq: Two times a day (BID) | ORAL | Status: DC

## 2018-03-30 MED ORDER — SODIUM CHLORIDE 0.9 % IV SOLN
INTRAVENOUS | Status: DC
  Administered 2018-03-30: 5.4 [IU]/h via INTRAVENOUS
  Filled 2018-03-30: qty 1

## 2018-03-30 MED ORDER — ROSUVASTATIN CALCIUM 10 MG PO TABS
5.0000 mg | ORAL_TABLET | Freq: Every day | ORAL | Status: DC
  Administered 2018-03-30: 5 mg via ORAL
  Filled 2018-03-30: qty 1

## 2018-03-30 MED ORDER — FERROUS SULFATE 325 (65 FE) MG PO TABS
325.0000 mg | ORAL_TABLET | Freq: Every day | ORAL | Status: DC
  Administered 2018-03-31: 325 mg via ORAL
  Filled 2018-03-30: qty 1

## 2018-03-30 MED ORDER — LORAZEPAM 2 MG/ML IJ SOLN
0.5000 mg | Freq: Once | INTRAMUSCULAR | Status: AC
  Administered 2018-03-30: 0.5 mg via INTRAVENOUS
  Filled 2018-03-30: qty 1

## 2018-03-30 MED ORDER — SODIUM CHLORIDE 0.9 % IV SOLN
Freq: Once | INTRAVENOUS | Status: AC
  Administered 2018-03-30: 23:00:00 via INTRAVENOUS

## 2018-03-30 MED ORDER — TORSEMIDE 100 MG PO TABS
100.0000 mg | ORAL_TABLET | Freq: Every day | ORAL | Status: DC
  Filled 2018-03-30: qty 1

## 2018-03-30 MED ORDER — DEXTROSE-NACL 5-0.45 % IV SOLN: INTRAVENOUS | Status: DC

## 2018-03-30 MED ORDER — HYDRALAZINE HCL 50 MG PO TABS
50.0000 mg | ORAL_TABLET | Freq: Three times a day (TID) | ORAL | Status: DC
  Administered 2018-03-30 – 2018-03-31 (×3): 50 mg via ORAL
  Filled 2018-03-30 (×4): qty 1

## 2018-03-30 MED ORDER — OXYCODONE HCL 5 MG PO TABS
5.0000 mg | ORAL_TABLET | Freq: Four times a day (QID) | ORAL | Status: DC | PRN
  Filled 2018-03-30: qty 1

## 2018-03-30 MED ORDER — TAMSULOSIN HCL 0.4 MG PO CAPS
0.4000 mg | ORAL_CAPSULE | Freq: Every day | ORAL | Status: DC
  Administered 2018-03-30: 0.4 mg via ORAL
  Filled 2018-03-30: qty 1

## 2018-03-30 MED ORDER — CARVEDILOL 25 MG PO TABS
25.0000 mg | ORAL_TABLET | Freq: Two times a day (BID) | ORAL | Status: DC
  Administered 2018-03-30 – 2018-03-31 (×3): 25 mg via ORAL
  Filled 2018-03-30 (×3): qty 1

## 2018-03-30 MED ORDER — SODIUM CHLORIDE 0.9 % IV SOLN
Freq: Once | INTRAVENOUS | Status: AC
  Administered 2018-03-30: 15:00:00 via INTRAVENOUS

## 2018-03-30 MED ORDER — SODIUM CHLORIDE 0.9 % IV SOLN: INTRAVENOUS | Status: DC

## 2018-03-30 MED ORDER — CALCIUM CARB-CHOLECALCIFEROL 600-800 MG-UNIT PO CHEW: 1.0000 | CHEWABLE_TABLET | Freq: Every evening | ORAL | Status: DC

## 2018-03-30 MED ORDER — HYDROXYZINE HCL 25 MG PO TABS
25.0000 mg | ORAL_TABLET | Freq: Three times a day (TID) | ORAL | Status: DC | PRN
  Administered 2018-03-31: 25 mg via ORAL
  Filled 2018-03-30: qty 1

## 2018-03-30 MED ORDER — SODIUM CHLORIDE 0.9 % IV SOLN
INTRAVENOUS | Status: DC
  Administered 2018-03-30: 20:00:00 via INTRAVENOUS

## 2018-03-30 MED ORDER — INSULIN ASPART 100 UNIT/ML ~~LOC~~ SOLN
0.0000 [IU] | SUBCUTANEOUS | Status: DC
  Administered 2018-03-30: 3 [IU] via SUBCUTANEOUS
  Administered 2018-03-31: 2 [IU] via SUBCUTANEOUS
  Administered 2018-03-31: 5 [IU] via SUBCUTANEOUS
  Administered 2018-03-31: 2 [IU] via SUBCUTANEOUS
  Administered 2018-03-31: 8 [IU] via SUBCUTANEOUS
  Administered 2018-03-31: 3 [IU] via SUBCUTANEOUS

## 2018-03-30 MED ORDER — INSULIN ASPART 100 UNIT/ML ~~LOC~~ SOLN
10.0000 [IU] | Freq: Once | SUBCUTANEOUS | Status: DC
  Administered 2018-03-30: 10 [IU] via SUBCUTANEOUS
  Filled 2018-03-30: qty 1

## 2018-03-30 MED ORDER — BUSPIRONE HCL 5 MG PO TABS
5.0000 mg | ORAL_TABLET | Freq: Every day | ORAL | Status: DC
  Administered 2018-03-31: 5 mg via ORAL
  Filled 2018-03-30 (×2): qty 1

## 2018-03-30 MED ORDER — CYANOCOBALAMIN 1000 MCG/ML IJ SOLN: 1000.0000 ug | INTRAMUSCULAR | Status: DC

## 2018-03-30 MED ORDER — ALLOPURINOL 100 MG PO TABS
100.0000 mg | ORAL_TABLET | Freq: Every evening | ORAL | Status: DC
  Administered 2018-03-30 – 2018-03-31 (×2): 100 mg via ORAL
  Filled 2018-03-30 (×2): qty 1

## 2018-03-30 MED ORDER — RANOLAZINE ER 500 MG PO TB12
500.0000 mg | ORAL_TABLET | Freq: Two times a day (BID) | ORAL | Status: DC
  Administered 2018-03-30 – 2018-03-31 (×2): 500 mg via ORAL
  Filled 2018-03-30 (×2): qty 1

## 2018-03-30 MED ORDER — SODIUM CHLORIDE 0.9 % IV BOLUS: 500.0000 mL | Freq: Once | INTRAVENOUS | Status: DC

## 2018-03-30 MED ORDER — NITROGLYCERIN 0.4 MG SL SUBL: 0.4000 mg | SUBLINGUAL_TABLET | SUBLINGUAL | Status: DC | PRN

## 2018-03-30 MED ORDER — PANTOPRAZOLE SODIUM 40 MG PO TBEC: 40.0000 mg | DELAYED_RELEASE_TABLET | Freq: Every day | ORAL | Status: DC

## 2018-03-30 NOTE — ED Notes (Signed)
This tech collected CBG. Result was "HI." RN, Larene Beach, was notified of CBG results.

## 2018-03-30 NOTE — ED Notes (Signed)
.. ED TO INPATIENT HANDOFF REPORT  Name/Age/Gender Kyle [name] 77 y.o. male  Code Status Code Status History    Date Active Date Inactive Code Status Order ID Comments User Context   11/24/2017 2024 11/25/2017 1436 Full Code [id number]  Norval Morton, MD ED   07/06/2016 0421 07/09/2016 1833 Full Code [id number]  Dannielle Burn, MD Inpatient   03/19/2016 2036 03/26/2016 1640 Partial Code [id number]  Ivor Costa, MD Inpatient      Home/SNF/Other Home  Chief Complaint Hyperglycemia  Level of Care/Admitting Diagnosis ED Disposition    ED Disposition Condition [place]: [facility] [[id number]]  Level of Care: ICU [6]  Diagnosis: Hyperglycemia [[id number]]  Admitting Physician: Sherene Sires [[id number]]  Attending Physician: Mingo Amber, JEFFREY H [[id number]]  Estimated length of stay: past midnight tomorrow  Certification:: I certify this patient will need inpatient services for at least 2 midnights  PT Class (Do Not Modify): Inpatient [101]  PT Acc Code (Do Not Modify): Private [1]       Medical History Past Medical History:  Diagnosis Date  . Acute on chronic diastolic CHF (congestive heart failure) ([place]) 07/07/2016  . Acute respiratory failure with hypoxia ([place]) 03/19/2016  . AKI (acute kidney injury) ([place])   . Anemia   . Arthritis    "I'm eat up w/it" (07/08/2016)  . Atrial fibrillation ([place])   . BPH (benign prostatic hyperplasia)   . CAD (coronary artery disease)   . CAP (community acquired pneumonia) 03/19/2016  . Chronic anticoagulation 01/22/2016  . Chronic diastolic heart failure ([place]) 01/22/2016   Overview:  3 recent admissions with decompensated heart failure  . Chronic kidney disease, stage IV (severe) ([place])   . Chronic systolic (congestive) heart failure ([place])   . CKD (chronic kidney disease), stage IV ([place])   . Coronary artery disease involving native heart with angina pectoris [facility])    Overview:  coronary angiography July 2017  showed occlusion of his native coronary arteries and vein grafts and he is perfused by left thoracic artery judged to be best treated medically.  . Depression with anxiety   . DM due to underlying condition with diabetic chronic kidney disease ([place])    Type 2  . Essential hypertension   . Fluid overload 03/19/2016  . GERD (gastroesophageal reflux disease)   . GERD (gastroesophageal reflux disease)   . Gout   . H/O coronary artery bypass surgery 03/23/2012  . Headache   . Heart failure ([place]) 03/24/2012  . HLD (hyperlipidemia)   . Hyperlipidemia   . Hypertensive heart disease with heart failure ([place]) 01/22/2016  . Myocardial infarction ([place])    "I've had several light heart attacks over this past year; might have had one this time" (07/08/2016)  . NSTEMI (non-ST elevated myocardial infarction) ([place]) 03/19/2016  . On home oxygen therapy    "2L prn" (07/08/2016)  . Pneumonia   . PONV (postoperative nausea and vomiting)   . Restless leg syndrome   . Shortness of breath   . Shortness of breath dyspnea   . Status post coronary artery stent placement 03/23/2012  . Uncontrolled type 2 diabetes mellitus with complication (HCC)     Allergies Allergies  Allergen Reactions  . No Known Allergies     IV Location/Drains/Wounds Patient Lines/Drains/Airways Status   Active Line/Drains/Airways    Name:   Placement date:   Placement time:   Site:   Days:   Peripheral IV 11/24/17 Left;Anterior Forearm  11/24/17    1836    Forearm   126   Peripheral IV 03/30/18 Left Antecubital   03/30/18    1242    Antecubital   less than 1   Fistula / Graft Right Forearm Arteriovenous fistula   07/13/16    1102    Forearm   625   Fistula / Graft Right Upper arm Arteriovenous fistula   02/17/17    1244    Upper arm   406   Incision (Closed) 07/13/16 Arm Left   07/13/16    1101     625   Incision (Closed) 02/17/17 Arm Right   02/17/17    1148     406          Labs/Imaging Results for orders placed or performed  during the hospital encounter of 03/30/18 (from the past 48 hour(s))  CBG monitoring, ED     Status: Abnormal   Collection Time: 03/30/18 12:49 PM  Result Value Ref Range   Glucose-Capillary >600 (HH) 70 - 99 mg/dL   Comment 1 Notify RN   CBG monitoring, ED     Status: Abnormal   Collection Time: 03/30/18 12:49 PM  Result Value Ref Range   Glucose-Capillary >600 (HH) 70 - 99 mg/dL   Comment 1 Notify RN   Comprehensive metabolic panel     Status: Abnormal   Collection Time: 03/30/18  1:02 PM  Result Value Ref Range   Sodium 123 (L) 135 - 145 mmol/L   Potassium 5.5 (H) 3.5 - 5.1 mmol/L   Chloride 90 (L) 98 - 111 mmol/L    Comment: Please note change in reference range.   CO2 23 22 - 32 mmol/L   Glucose, Bld 787 (HH) 70 - 99 mg/dL    Comment: Please note change in reference range. CRITICAL RESULT CALLED TO, READ BACK BY AND VERIFIED WITH: Bevelyn Ngo RN [phone] 03/30/2018 BY A BENNETT    BUN 131 (H) 8 - 23 mg/dL    Comment: Please note change in reference range.   Creatinine, Ser 3.77 (H) 0.61 - 1.24 mg/dL   Calcium 8.4 (L) 8.9 - 10.3 mg/dL   Total Protein 5.1 (L) 6.5 - 8.1 g/dL   Albumin 3.0 (L) 3.5 - 5.0 g/dL   AST 14 (L) 15 - 41 U/L   ALT 43 0 - 44 U/L    Comment: Please note change in reference range.   Alkaline Phosphatase 68 38 - 126 U/L   Total Bilirubin 0.8 0.3 - 1.2 mg/dL   GFR calc non Af Amer 14 (L) >60 mL/min   GFR calc Af Amer 16 (L) >60 mL/min    Comment: (NOTE) The eGFR has been calculated using the CKD EPI equation. This calculation has not been validated in all clinical situations. eGFR's persistently <60 mL/min signify possible Chronic Kidney Disease.    Anion gap 10 5 - 15    Comment: Performed at [facility] [address] [place], [place] [address]  CBC with Differential     Status: Abnormal   Collection Time: 03/30/18  1:02 PM  Result Value Ref Range   WBC 14.8 (H) 4.0 - 10.5 K/uL   RBC 2.53 (L) 4.22 - 5.81 MIL/uL   Hemoglobin 7.6 (L) 13.0 - 17.0  g/dL   HCT 22.9 (L) 39.0 - 52.0 %   MCV 90.5 78.0 - 100.0 fL   MCH 30.0 26.0 - 34.0 pg   MCHC 33.2 30.0 - 36.0 g/dL  RDW 14.6 11.5 - 15.5 %   Platelets 321 150 - 400 K/uL   Neutrophils Relative % 89 %   Neutro Abs 13.2 (H) 1.7 - 7.7 K/uL   Lymphocytes Relative 5 %   Lymphs Abs 0.7 0.7 - 4.0 K/uL   Monocytes Relative 5 %   Monocytes Absolute 0.8 0.1 - 1.0 K/uL   Eosinophils Relative 0 %   Eosinophils Absolute 0.0 0.0 - 0.7 K/uL   Basophils Relative 0 %   Basophils Absolute 0.0 0.0 - 0.1 K/uL   Immature Granulocytes 1 %   Abs Immature Granulocytes 0.1 0.0 - 0.1 K/uL    Comment: Performed at [facility] [address] [place], [place] [address]  Urinalysis, Routine w reflex microscopic     Status: Abnormal   Collection Time: 03/30/18  1:02 PM  Result Value Ref Range   Color, Urine STRAW (A) YELLOW   APPearance CLEAR CLEAR   Specific Gravity, Urine 1.009 1.005 - 1.030   pH 7.0 5.0 - 8.0   Glucose, UA >=500 (A) NEGATIVE mg/dL   Hgb urine dipstick NEGATIVE NEGATIVE   Bilirubin Urine NEGATIVE NEGATIVE   Ketones, ur NEGATIVE NEGATIVE mg/dL   Protein, ur 30 (A) NEGATIVE mg/dL   Nitrite NEGATIVE NEGATIVE   Leukocytes, UA NEGATIVE NEGATIVE   WBC, UA 0-5 0 - 5 WBC/hpf   Bacteria, UA NONE SEEN NONE SEEN    Comment: Performed at [facility] [address] [place], [place] [address]  Magnesium     Status: None   Collection Time: 03/30/18  1:02 PM  Result Value Ref Range   Magnesium 2.1 1.7 - 2.4 mg/dL    Comment: Performed at [facility], [place] [address], [place], [place] [address]  I-Stat venous blood gas, ED     Status: Abnormal   Collection Time: 03/30/18  1:19 PM  Result Value Ref Range   pH, Ven 7.414 7.250 - 7.430   pCO2, Ven 34.0 (L) 44.0 - 60.0 mmHg   pO2, Ven 25.0 (LL) 32.0 - 45.0 mmHg   Bicarbonate 21.7 20.0 - 28.0 mmol/L   TCO2 23 22 - 32 mmol/L   O2 Saturation 48.0 %   Acid-base deficit 2.0 0.0 - 2.0 mmol/L   Patient temperature HIDE     Sample type VENOUS    Comment NOTIFIED PHYSICIAN   I-stat troponin, ED     Status: Abnormal   Collection Time: 03/30/18  1:25 PM  Result Value Ref Range   Troponin i, poc 0.09 (HH) 0.00 - 0.08 ng/mL   Comment NOTIFIED PHYSICIAN    Comment 3            Comment: Due to the release kinetics of cTnI, a negative result within the first hours of the onset of symptoms does not rule out myocardial infarction with certainty. If myocardial infarction is still suspected, repeat the test at appropriate intervals.   CBG monitoring, ED     Status: Abnormal   Collection Time: 03/30/18  2:58 PM  Result Value Ref Range   Glucose-Capillary >600 (HH) 70 - 99 mg/dL  CBG monitoring, ED     Status: Abnormal   Collection Time: 03/30/18  4:05 PM  Result Value Ref Range   Glucose-Capillary >600 (HH) 70 - 99 mg/dL  CBG monitoring, ED     Status: Abnormal   Collection Time: 03/30/18  5:35 PM  Result Value Ref Range   Glucose-Capillary 406 (H) 70 - 99 mg/dL  Comment 1 Notify RN   CBG monitoring, ED     Status: Abnormal   Collection Time: 03/30/18  6:34 PM  Result Value Ref Range   Glucose-Capillary 280 (H) 70 - 99 mg/dL   Dg Chest Port 1 View  Result Date: 03/30/2018 CLINICAL DATA:  Dry cough. EXAM: PORTABLE CHEST 1 VIEW COMPARISON:  Chest x-ray dated March 20, 2018. FINDINGS: Stable mild cardiomegaly status post CABG. Normal pulmonary vascularity. Atherosclerotic calcification of the aortic arch. No focal consolidation, pleural effusion, or pneumothorax. No acute osseous abnormality. IMPRESSION: No active disease. Electronically Signed   By: Titus Dubin M.D.   On: 03/30/2018 13:50    Pending Labs Unresulted Labs (From admission, onward)   Start     Ordered   03/31/18 0500  Lipid panel  Tomorrow morning,   R     03/30/18 1648   03/31/18 0500  TSH  Tomorrow morning,   R     03/30/18 1648   03/30/18 3241  Basic metabolic panel  STAT Now then every 4 hours ,   STAT     03/30/18 1744   03/30/18  1744  Troponin I  Now then every 6 hours,   R     03/30/18 1744   03/30/18 9914  Basic metabolic panel  Now then every 4 hours,   R     03/30/18 1635   03/30/18 1616  Phosphorus  Add-on,   R     03/30/18 1615   Signed and Held  Hemoglobin A1c  Once,   R     Signed and Held   Signed and Held  CBC  Daily,   R     Signed and Held      Vitals/Pain Today's Vitals   03/30/18 1700 03/30/18 1715 03/30/18 1730 03/30/18 1800  BP: (!) 166/52 (!) 148/62 (!) 169/58 (!) 166/61  Pulse: 70 68 66 63  Resp: _0 Temp:      TempSrc:      SpO2: 99% 97% 98% 98%  Weight:      Height:        Isolation Precautions No active isolations  Medications Medications  dextrose 5 %-0.45 % sodium chloride infusion (has no administration in time range)  insulin regular (NOVOLIN R,HUMULIN R) 100 Units in sodium chloride 0.9 % 100 mL (1 Units/mL) infusion (10.8 Units/hr Intravenous Rate/Dose Change 03/30/18 1606)  0.9 %  sodium chloride infusion (has no administration in time range)  LORazepam (ATIVAN) injection 0.5 mg (0.5 mg Intravenous Given 03/30/18 1427)  0.9 %  sodium chloride infusion ( Intravenous New Bag/Given 03/30/18 1450)    Mobility ambulatory

## 2018-03-30 NOTE — ED Triage Notes (Signed)
BIB EMS for further eval of hyperglycemia; known h/o diabetes; on insulin routinely - has had issues with hyperglycemia x2-3 wks - initially increased normal dose of insulin - continued hyperglycemia - additionally recently placed on Prednisone - unk rationale per pt and family; placed on sliding scale yesterday - was to start today but has not picked meds up yet; BS currently reading HIGH

## 2018-03-30 NOTE — Consult Note (Addendum)
Cardiology Consult    Patient ID: Kyle [name] MRN: [id number], DOB/AGE: [birth year]   Admit date: 03/30/2018 Date of Consult: 03/30/2018  Primary Physician: Ocie Doyne., MD Primary Cardiologist: Jenean Lindau, MD Requesting Provider: J.Walden, MD  Patient Profile    Kyle [name] is a 77 y.o. male with a history of CAD status post CABG with severe multivessel disease on catheterization 2017, chronic combined systolic and diastolic congestive heart failure, stage IV chronic kidney disease, ischemic cardiomyopathy, paroxysmal atrial fibrillation on chronic anticoagulation, hypertension, hyperlipidemia, diabetes, GERD, and moderate mitral regurgitation who is being seen today for the evaluation of elevated troponin in the setting of hyperglycemia at the request of Dr. Mingo Amber.  Past Medical History   Past Medical History:  Diagnosis Date  . Acute respiratory failure with hypoxia ([place]) 03/19/2016  . AKI (acute kidney injury) ([place])   . Anemia   . Arthritis    "I'm eat up w/it" (07/08/2016)  . Atrial fibrillation ([facility])    a. CHA2DS2VASc = 6-->Eliquis.  Marland Kitchen BPH (benign prostatic hyperplasia)   . CAP (community acquired pneumonia) 03/19/2016  . Chronic anticoagulation 01/22/2016  . Chronic combined systolic (congestive) and diastolic (congestive) heart failure ([place])    a. 09/2017 Echo: EF 40%, distal septal/apical AK, prox inf AK. Restrictive diast filling. Mid LAD. Mod MR.  . Chronic kidney disease, stage IV (severe) ([place])   . Coronary artery disease involving native heart with angina pectoris ([place])    a. s/p prior MI's, stenting, and CABG x 3; b. 03/2016 Cath: LM nl, LAD 100p, 80d (after LIMA insertion), RI small, LCX 100ost, RCA 70p, 165m ISR, rPDA fills via L->R collats. LIMA->LAD ok, Radial Artery->OM ok, VG->RPDA 100-->Med Rx recommended.  . Depression with anxiety   . DM due to underlying condition with diabetic chronic kidney disease ([place])    Type 2  . Essential hypertension   .  Fluid overload 03/19/2016  . GERD (gastroesophageal reflux disease)   . Gout   . H/O coronary artery bypass surgery 03/23/2012  . Headache   . Hyperlipidemia   . Hypertensive heart disease with heart failure ([place]) 01/22/2016  . Moderate mitral regurgitation    a. 09/2017 Echo.  . Myocardial infarction ([place])    "I've had several light heart attacks over this past year; might have had one this time" (07/08/2016)  . NSTEMI (non-ST elevated myocardial infarction) ([place]) 03/19/2016  . On home oxygen therapy    "2L prn" (07/08/2016)  . Pneumonia   . PONV (postoperative nausea and vomiting)   . Restless leg syndrome   . Status post coronary artery stent placement 03/23/2012  . Uncontrolled type 2 diabetes mellitus with complication [facility])     Past Surgical History:  Procedure Laterality Date  . AV FISTULA PLACEMENT Right 07/13/2016   Procedure: ARTERIOVENOUS (AV) FISTULA CREATION RIGHT LOWER ARM;  Surgeon: [name], MD;  Location: [facility];  Service: Vascular;  Laterality: Right;  . BASCILIC VEIN TRANSPOSITION Right 02/17/2017   Procedure: BASILIC VEIN TRANSPOSITION RIGHT ARM;  Surgeon: [name], MD;  Location: [facility];  Service: Vascular;  Laterality: Right;  . CARDIAC CATHETERIZATION N/A 03/24/2016   Procedure: Right/Left Heart Cath and Coronary/Graft Angiography;  Surgeon: Troy Sine, MD;  Location: [facility];  Service: Cardiovascular;  Laterality: N/A;  . CATARACT EXTRACTION W/ INTRAOCULAR LENS  IMPLANT, BILATERAL Bilateral   . COLONOSCOPY    . CORONARY ANGIOPLASTY WITH STENT PLACEMENT     "I've had  a few; some w/stents" (07/08/2016)  . CORONARY ARTERY BYPASS GRAFT  ?1995   CABG X"4"     Allergies  Allergies  Allergen Reactions  . No Known Allergies     History of Present Illness    77 year old male with the above complex past medical history including CAD status post CABG, chronic combined systolic and diastolic congestive heart failure, stage IV chronic kidney disease,  ischemic cardiomyopathy with an EF of 40%, hypertension, paroxysmal atrial fibrillation on chronic anticoagulation, hyperlipidemia, diabetes, GERD, and moderate mitral regurgitation.  Most recent diagnostic cardiac catheterization was performed in 2017 revealing severe native multivessel disease with occlusions of the LAD, circumflex, and RCA.  He had 2 of 3 patent grafts with an occlusion of the vein graft to the RPDA.  The LIMA to the LAD and vein graft to an obtuse marginal were patent.  His last stress test was in January 2019 showing no evidence of ischemia or infarct.  EF was noted to be low on stress testing and this was followed by echocardiogram which showed stable LV function/dysfunction with an EF of 40%.  He was last seen in clinic by Dr. Geraldo Pitter in late January, at which time he was doing well.  Patient lives at home and [place] where he is relatively inactive.  He is chronic lower extremity edema and skin breakdown around his ankles and feet.  He was seen by a home health aide today and noted to be hyperglycemic and subsequently taken to the [facility], ED.  Here, he was hyperglycemic with a CBG of 787.  In that setting, creatinine was elevated above baseline at 3.77.  ECG showed more pronounced ST and T changes in inferolateral leads.  Troponin returned at 0.09 and cardiology was consulted.  He has not had any chest pain.  Inpatient Medications    . allopurinol  100 mg Oral QPM  . amLODipine  5 mg Oral QPM  . apixaban  2.5 mg Oral BID  . aspirin EC  81 mg Oral QPM  . [START ON 03/31/2018] busPIRone  5 mg Oral Daily  . [START ON 03/31/2018] calcium carbonate  1 tablet Oral Q breakfast  . carvedilol  25 mg Oral BID WC  . clopidogrel  75 mg Oral Daily  . [START ON 03/31/2018] ferrous sulfate  325 mg Oral Q breakfast  . hydrALAZINE  50 mg Oral TID  . insulin aspart  0-15 Units Subcutaneous Q4H  . isosorbide mononitrate  30 mg Oral TID  . [START ON 03/31/2018] pantoprazole  40 mg Oral Daily   . ranolazine  500 mg Oral BID  . rOPINIRole  2 mg Oral QHS  . rosuvastatin  5 mg Oral QHS  . tamsulosin  0.4 mg Oral QHS  . [START ON 03/31/2018] torsemide  100 mg Oral Daily    Family History    Family History  Problem Relation Age of Onset  . Heart disease Mother   . CAD Mother   . Heart disease Father   . Diabetes Father   . Stroke Father   . Lung cancer Brother   . Diabetes Brother   . Diabetes Sister    indicated that his mother is deceased. He indicated that his father is deceased. He indicated that the status of his sister is unknown. He indicated that the status of his brother is unknown.   Social History    Social History   Socioeconomic History  . Marital status: Married    Spouse name:  Not on file  . Number of children: Not on file  . Years of education: Not on file  . Highest education level: Not on file  Occupational History  . Not on file  Social Needs  . Financial resource strain: Not on file  . Food insecurity:    Worry: Not on file    Inability: Not on file  . Transportation needs:    Medical: Not on file    Non-medical: Not on file  Tobacco Use  . Smoking status: Former Smoker    Packs/day: 2.00    Years: 12.00    Pack years: 24.00    Types: Cigarettes    Last attempt to quit: 1970    Years since quitting: 49.5  . Smokeless tobacco: Never Used  Substance and Sexual Activity  . Alcohol use: Yes    Alcohol/week: 0.0 oz    Comment: 07/08/2016 nothing since 1970"  . Drug use: No  . Sexual activity: Not on file  Lifestyle  . Physical activity:    Days per week: Not on file    Minutes per session: Not on file  . Stress: Not on file  Relationships  . Social connections:    Talks on phone: Not on file    Gets together: Not on file    Attends religious service: Not on file    Active member of club or organization: Not on file    Attends meetings of clubs or organizations: Not on file    Relationship status: Not on file  . Intimate  partner violence:    Fear of current or ex partner: Not on file    Emotionally abused: Not on file    Physically abused: Not on file    Forced sexual activity: Not on file  Other Topics Concern  . Not on file  Social History Narrative  . Not on file     Review of Systems    General:  No chills, fever, night sweats or weight changes.  Cardiovascular:  No chest pain, dyspnea on exertion, +++ LE edema, +++ chronic orthopnea - sleeps in recliner. No palpitations, paroxysmal nocturnal dyspnea. Dermatological: No rash, lesions/masses Respiratory: No cough, dyspnea Urologic: No hematuria, dysuria Abdominal:   No nausea, vomiting, diarrhea, bright red blood per rectum, melena, or hematemesis Neurologic:  No visual changes, wkns, changes in mental status. All other systems reviewed and are otherwise negative except as noted above.  Physical Exam    Blood pressure (!) 170/56, pulse (!) 50, temperature (!) 97.5 F (36.4 C), temperature source Oral, resp. rate 16, height 5\' 9"  (1.753 m), weight 165 lb (74.8 kg), SpO2 98 %.  General: Pleasant, NAD Psych: Normal affect. Neuro: Alert and oriented X 3. Moves all extremities spontaneously. HEENT: Normal  Neck: Supple without bruits or JVD. Lungs:  Resp regular and unlabored, CTA. Heart: RRR no s3, s4, or murmurs. Abdomen: Soft, non-tender, non-distended, BS + x 4.  Extremities: No clubbing, cyanosis. 1+ bilat LE edema w/ skin breakdown bilat. DP/PT/Radials 1+ and equal bilaterally.  Labs    Troponin [facility] Test) Recent Labs    03/30/18 1325  TROPIPOC 0.09*   Recent Labs    03/30/18 1755  TROPONINI 0.11*   Lab Results  Component Value Date   WBC 14.8 (H) 03/30/2018   HGB 7.6 (L) 03/30/2018   HCT 22.9 (L) 03/30/2018   MCV 90.5 03/30/2018   PLT 321 03/30/2018    Recent Labs  Lab 03/30/18 1302 03/30/18 1755  NA 123* 131*  K 5.5* 4.3  CL 90* 94*  CO2 23 22  BUN 131* 130*  CREATININE 3.77* 3.66*  CALCIUM 8.4* 9.1    PROT 5.1*  --   BILITOT 0.8  --   ALKPHOS 68  --   ALT 43  --   AST 14*  --   GLUCOSE 787* 394*   Lab Results  Component Value Date   TRIG 82 07/06/2016     Radiology Studies    Dg Chest Port 1 View  Result Date: 03/30/2018 CLINICAL DATA:  Dry cough. EXAM: PORTABLE CHEST 1 VIEW COMPARISON:  Chest x-ray dated March 20, 2018. FINDINGS: Stable mild cardiomegaly status post CABG. Normal pulmonary vascularity. Atherosclerotic calcification of the aortic arch. No focal consolidation, pleural effusion, or pneumothorax. No acute osseous abnormality. IMPRESSION: No active disease. Electronically Signed   By: Titus Dubin M.D.   On: 03/30/2018 13:50    ECG & Cardiac Imaging    RSR, 68, 1st deg avb, inferolateral ST dep/TWI - slightly more pronounced than on prior ECG's.  Assessment & Plan    1.  Hyperglycemia: IV insulin and management per medicine.  2.  Elevated troponin/CAD: Patient with extensive native coronary artery disease with occlusions of all native major vessels and patent LIMA to the LAD and radial artery to the obtuse marginal by catheterization in 2017.  He had a nonischemic stress test earlier this year.  In the setting of hyperglycemia and hypertension, he was noted to have a troponin elevation, currently 0.11.  He has not had any chest pain.  Certainly in the setting of severe disease, physiologic stress might result in mild troponin elevation.  This does not represent ACS.  Continue medical therapy including beta-blocker, aspirin, Plavix, statin, nitrate, and Ranexa.  Will check echo.  With stage IV chronic kidney disease, would not plan to pursue further ischemic evaluation at this time.  3.  Paroxysmal atrial fibrillation: In sinus rhythm.  Continue beta-blocker. Hold eliquis w/ acutely worsening anemia.  Will have to make decision re: long term [place] as he is also on ASA/plavix.  No indication for triple therapy.  Provided that H/H stable, could potentially look to resume  eliquis but would certainly stop ASA @ that point.  4.  Normocytic anemia: H&H 7.6/22.9.  Both are down from baseline. Pt refused transfusion.  Hold eliquis for time being.  5.  Chronic combined syst/diast CHF: EF 40%. LE edema but no significant JVD. Lungs clear.  Cont current meds.  6. Essential HTN:  BP moderately elevated.  Cont current meds and adjust as necessary.    7.  HL:  Cont statin rx.  8.  LE wounds:  Wound care.  9. Acute on chronic stage iv kidney dzs:  Follow.  Signed, Murray Hodgkins, NP 03/30/2018, 8:03 PM    For questions or updates, please contact   Please consult [email or web address] for contact info under Cardiology/STEMI.  The patient was seen, examined and discussed with Ignacia Bayley, NP and agree as above.  77 year old male with h/o  CAD status post CABG, chronic combined systolic and diastolic congestive heart failure, stage IV chronic kidney disease, ischemic cardiomyopathy with an EF of 40%, hypertension, paroxysmal atrial fibrillation on chronic anticoagulation, hyperlipidemia, diabetes, GERD, and moderate mitral regurgitation.  Most recent diagnostic cardiac catheterization was performed in 2017 revealing severe native multivessel disease with occlusions of the LAD, circumflex, and RCA.  He had 2 of 3 patent grafts with an occlusion of the  vein graft to the RPDA.  The LIMA to the LAD and vein graft to an obtuse marginal were patent.  His last stress test was in January 2019 showing no evidence of ischemia or infarct.  EF was noted to be low on stress testing and this was followed by echocardiogram which showed stable LV function/dysfunction with an EF of 40%.   He was admitted with hyperglycemia diagnosed by a home health aide, CBG of 787.  In that setting, creatinine was elevated above baseline at 3.77, baseline 3.3.  ECG showed more pronounced ST and T changes in inferolateral leads.  Troponin returned at 0.09 He denies any chest pain, SOB, no orthopnea, PND, has  chronic LE edema, he has been sleeping in a recliner for a long time.  Physical exam reveals a male that is comfortable, has no JVD, 3/6 systolic murmur, lungs CTA, poor TP/DP B/L, +2 B/L LE edema.  ECG shows SR, diffuse STD and negative T waves slightly more pronounced than previously  Plan: - Continue therapy for hyperglycemia, iv hydration - Troponin minimally elevated in the settings of hyperglycemia and acute on chronic kidney failure stage 4, no symptoms, no ischemic workup is indicated - Increase amlodipine to 10 mg po daily - hold torsemide   Ena Dawley, MD 03/30/2018

## 2018-03-30 NOTE — H&P (Signed)
[facility] Admission History and Physical Service Pager: [phone]  Patient name: Kyle [name] Medical record number: [id number] Date of birth: [birth year] Age: 77 y.o. Gender: male  Primary Care Provider: Ocie Doyne., MD Consultants: Cardio, Nephro Code Status: Partial/DNI  Chief Complaint: weakness  Assessment and Plan: Kyle [name] is a 77 y.o. male presenting with hyperglycemia. PMH is significant for CKD stage 4 with av fistula placed for HD, A. fib, GERD, HTN, HLD, BPH, CAD w. Angina, hx of bypass surgery, and HFrEF at 40%.  Hyperglycemia: On admission cbg 787, non-acidotic, hyperosmotic 333.  Mentating well and HTN but with good vitals otherwise.  Last A1C 14 in January '19.  Home prescribed 30 U Novolin BID and 5 Novolog meal coverage TID, actually dosing is undetermined.  Recently completed 1wk prednisone which raised claimed sugars from 200s to >500s at home, last 2 days "high".  Likely a combination of chronic under medication combined with steroid induced hyperglycemia.  - Admit to ICU for insulin drip, Attending Dr. Mingo [name]. - Insulin infusion 0.01u/kg/hr, will attempt to use home novolin/novolog formulations when transferring off pump - IV NS 100cc/hr   - Physical assessment 2x shift for fluid status - CBG q1 hours - BMPs q4 for K (5.5 on admission) and Na (133 corrected on admission)  AKI on CKD4: baseline Cr ~3.3, 3.7 on admission.  Still urinating, no dysuria but increased frequency/urgency bordering on incontinence last 2 days.  Has fistula in anticipation of dialysis, follows with [facility].   - Nephro consulted - recommend insulin pump, IV fluids at 100cc, will see in AM - I&Os - Torsemide 100 qDaily - Avoid nephrotoxic meds - phos add on lab  CADw / elevated troponins: No chest pain.  ECG with potential ST changes in I, II, III over chronic changes in v4,v5, v6.   With hx of CABG and Stents, on ranexa, Aspirin, Carvedilol, Nitroglycerin.   Troponins 0.09 but lower than prior elevations from 10/17 and 7/17. -cardiology consulted -continuous cardiac monitor - Risk Labs: HbA1c, Lipid Panel, TSH -home Rosuvastatin 5mg  -home aspirin 81 -home plavix  -home nitro PRN -home ranexa  Anemia: 7.6 from baseline of 10.2.  No bleeding complaints although he is on eliquis.  Troponin elevated above normal but not above baseline.  Respirating well although claims weakness.  Likely anemia of chronic disease. -discussed potential of transfusion with patient and he refused as he had a negative reaction to a prior transfusion.  He consents to transfusion in the case of "absolute emergency".  CHF: last echo showed HFrEF EF 40% (January 2019) with mitral regurg.  No lung crackles, respirating well on room air but 3+ edema throughout legs which patient stated was better than normal - Continue amlodipine, carvedilol, imdur - Monitor I&Os  A. Fib without RVR: chronic, home apixaban, 2.5 - Cardiac monitoring - Apixaban  Hypertension: 150-160s sbp.  Chronic.  on home amlodipine, hydralazine, imdur - continue home meds  HLD:  - Lipid panel ordered - Rosuvastatin 5mg     GERD: takes Omeprazole - Pantoprazole  Anxiety: takes BuSpar, Hydroxyzine,  at home - continue home meds - Lorazepam 0.5mg  for acute issues  BPH:  - continue Tamsulosin  Gout: stable, no event currently -home allopurinol  Anxiety: home buspar/hydroxyzine, given ativan x1 in ED -home buspar/hydroxyzine  Iron deficiency: chronic, home ferous sulfate. Was anemic on admission to 7.6 which is down from prior baseline of mid 10's.  Chronic pain: stable, no pain complaints on admission.  home oxy 5prn  resltess leg: stable on admission, home ropinorole  FEN/GI: NPO sips w/ meds, pantoprazole Prophylaxis: home Eliquis  Disposition: admit to ICU with tele  History of Present Illness:  Kyle [name] is a 77 y.o. male with PMH of uncontrolled T2DM, CKD 4, and HFrEF  presenting with two days of worsening fatigue and weakness. He is currently recovering from bronchitis diagnosed a couple weeks ago. Because he has a history of pneumonia he was prescribed Ciprofloxacin and has finished this antibiotic course (7days per daughter). For the remaining cough he was prescribed several days of prednisone and took his last dose 7/16. He reports feeling "lifeless" the last couple days and unable to get out of bed, which is new for him (he works on a farm). The patient was also experiencing frequency of urination and thought his kidney problems were worsening. He denies chest and belly pain and dysuria  At home the patient takes Novolin and NovoLog, which are managed by the patient's wife, for diabetes control at home. His reported dose is different than prescribed, reports novolin 30 QH and ?15 novolog with meals.  He was not using sliding scale until it was prescribed last week (and never started) because they were doing a daily long-acting and consistent meal coverage. His sugars are poorly controlled. Home glucose checks are in the 200s, but had risen to the 500s and then "high" the last couple of days. His last HbA1c was 14.  No chest/belly pain, has had some diarrhea recentyl but now resolved.   Has been peeing a lot in the last few days.  Did take a course of cipro that finished 2wks ago and prednisone for bronchitis.  No currently respiratory complaints and non-febrile.  No falls lately, but does feel weak.  No balance changes.  Thinks the swelling in his feet has been better in the last few days, sees wound care 3x week for his chronic skin changes  Review Of Systems: Per HPI with the following additions:  Review of Systems  Constitutional: Positive for malaise/fatigue. Negative for chills, diaphoresis, fever and weight loss.  HENT: Negative for congestion.   Eyes: Negative for blurred vision and double vision.  Respiratory: Positive for cough. Negative for hemoptysis.    Cardiovascular: Positive for leg swelling. Negative for chest pain and palpitations.  Gastrointestinal: Positive for diarrhea. Negative for abdominal pain, blood in stool, nausea and vomiting.  Genitourinary: Positive for dysuria, frequency and urgency. Negative for flank pain and hematuria.  Musculoskeletal: Negative for back pain, falls, joint pain, myalgias and neck pain.  Skin: Negative for itching and rash.  Neurological: Positive for dizziness and weakness. Negative for seizures, loss of consciousness and headaches.  Endo/Heme/Allergies: Bruises/bleeds easily (On Eliquis).  Psychiatric/Behavioral: Negative for depression. The patient is not nervous/anxious.     Patient Active Problem List   Diagnosis Date Noted  . Hyperglycemia 03/30/2018  . Diarrhea 11/25/2017  . Hyperosmolar non-ketotic state in patient with type 2 diabetes mellitus ([place]) 11/24/2017  . CKD (chronic kidney disease) stage 4, GFR 15-29 ml/min (HCC) 10/05/2017  . Anemia 10/05/2017  . Pre-operative cardiovascular examination 10/05/2017  . Shortness of breath   . Uncontrolled type 2 diabetes mellitus with complication ([place])   . Coronary artery disease involving native heart with angina pectoris ([place])   . Acute respiratory failure with hypoxia ([place]) 03/19/2016  . Fluid overload 03/19/2016  . CAD (coronary artery disease)   . Chronic kidney disease, stage IV (severe) ([place])   .  Chronic atrial fibrillation ([place])   . GERD (gastroesophageal reflux disease)   . Essential hypertension   . Hyperlipidemia   . DM due to underlying condition with diabetic chronic kidney disease ([place])   . BPH (benign prostatic hyperplasia)   . Depression with anxiety   . Chronic anticoagulation 01/22/2016  . Hypertensive heart disease with heart failure ([place]) 01/22/2016  . Chronic diastolic heart failure ([place]) 01/22/2016  . H/O coronary artery bypass surgery 03/23/2012  . Status post coronary artery stent placement 03/23/2012    Past  Medical History: Past Medical History:  Diagnosis Date  . Acute on chronic diastolic CHF (congestive heart failure) ([place]) 07/07/2016  . Acute respiratory failure with hypoxia ([place]) 03/19/2016  . AKI (acute kidney injury) ([place])   . Anemia   . Arthritis    "I'm eat up w/it" (07/08/2016)  . Atrial fibrillation ([place])   . BPH (benign prostatic hyperplasia)   . CAD (coronary artery disease)   . CAP (community acquired pneumonia) 03/19/2016  . Chronic anticoagulation 01/22/2016  . Chronic diastolic heart failure ([place]) 01/22/2016   Overview:  3 recent admissions with decompensated heart failure  . Chronic kidney disease, stage IV (severe) ([place])   . Chronic systolic (congestive) heart failure ([place])   . CKD (chronic kidney disease), stage IV ([place])   . Coronary artery disease involving native heart with angina pectoris [facility])    Overview:  coronary angiography July 2017 showed occlusion of his native coronary arteries and vein grafts and he is perfused by left thoracic artery judged to be best treated medically.  . Depression with anxiety   . DM due to underlying condition with diabetic chronic kidney disease ([place])    Type 2  . Essential hypertension   . Fluid overload 03/19/2016  . GERD (gastroesophageal reflux disease)   . GERD (gastroesophageal reflux disease)   . Gout   . H/O coronary artery bypass surgery 03/23/2012  . Headache   . Heart failure ([place]) 03/24/2012  . HLD (hyperlipidemia)   . Hyperlipidemia   . Hypertensive heart disease with heart failure ([place]) 01/22/2016  . Myocardial infarction ([place])    "I've had several light heart attacks over this past year; might have had one this time" (07/08/2016)  . NSTEMI (non-ST elevated myocardial infarction) ([place]) 03/19/2016  . On home oxygen therapy    "2L prn" (07/08/2016)  . Pneumonia   . PONV (postoperative nausea and vomiting)   . Restless leg syndrome   . Shortness of breath   . Shortness of breath dyspnea   . Status post coronary artery  stent placement 03/23/2012  . Uncontrolled type 2 diabetes mellitus with complication [facility])     Past Surgical History: Past Surgical History:  Procedure Laterality Date  . AV FISTULA PLACEMENT Right 07/13/2016   Procedure: ARTERIOVENOUS (AV) FISTULA CREATION RIGHT LOWER ARM;  Surgeon: [name], MD;  Location: [facility];  Service: Vascular;  Laterality: Right;  . BASCILIC VEIN TRANSPOSITION Right 02/17/2017   Procedure: BASILIC VEIN TRANSPOSITION RIGHT ARM;  Surgeon: [name], MD;  Location: [facility];  Service: Vascular;  Laterality: Right;  . CARDIAC CATHETERIZATION N/A 03/24/2016   Procedure: Right/Left Heart Cath and Coronary/Graft Angiography;  Surgeon: Troy Sine, MD;  Location: [facility] CV [facility];  Service: Cardiovascular;  Laterality: N/A;  . CATARACT EXTRACTION W/ INTRAOCULAR LENS  IMPLANT, BILATERAL Bilateral   . COLONOSCOPY    . CORONARY ANGIOPLASTY WITH STENT PLACEMENT     "I've had a few; some  w/stents" (07/08/2016)  . CORONARY ARTERY BYPASS GRAFT  ?1995   CABG X"4"    Social History: Social History   Tobacco Use  . Smoking status: Former Smoker    Packs/day: 2.00    Years: 12.00    Pack years: 24.00    Types: Cigarettes    Last attempt to quit: 1970    Years since quitting: 49.5  . Smokeless tobacco: Never Used  Substance Use Topics  . Alcohol use: Yes    Alcohol/week: 0.0 oz    Comment: 07/08/2016 nothing since 1970"  . Drug use: No   Family History: Family History  Problem Relation Age of Onset  . Heart disease Mother   . CAD Mother   . Heart disease Father   . Diabetes Father   . Stroke Father   . Lung cancer Brother   . Diabetes Brother   . Diabetes Sister    Allergies and Medications: Allergies  Allergen Reactions  . No Known Allergies    No current facility-administered medications on file prior to encounter.    Current Outpatient Medications on File Prior to Encounter  Medication Sig Dispense Refill  . allopurinol (ZYLOPRIM) 100 MG  tablet Take 100 mg by mouth every evening.     Marland Kitchen amLODipine (NORVASC) 5 MG tablet Take 5 mg by mouth every evening.    Marland Kitchen apixaban (ELIQUIS) 2.5 MG TABS tablet Take 1 tablet (2.5 mg total) by mouth 2 (two) times daily. 60 tablet 0  . aspirin EC 81 MG tablet Take 81 mg by mouth every evening.     . Calcium Carb-Cholecalciferol (CALTRATE 600+D3 SOFT PO) Take 1 tablet by mouth every evening.     . carvedilol (COREG) 25 MG tablet Take 25 mg by mouth 2 (two) times daily with a meal.    . clopidogrel (PLAVIX) 75 MG tablet Take 75 mg by mouth every evening.     . cyanocobalamin (,VITAMIN B-12,) 1000 MCG/ML injection Inject 1,000 mcg into the muscle every 30 (thirty) days.    . ferrous sulfate 325 (65 FE) MG tablet Take 325 mg by mouth daily with breakfast.    . fexofenadine (ALLEGRA) 180 MG tablet Take 180 mg by mouth daily.  3  . hydrALAZINE (APRESOLINE) 50 MG tablet Take 50 mg by mouth 2 (two) times daily.     . hydrOXYzine (ATARAX/VISTARIL) 25 MG tablet Take 25 mg by mouth 3 (three) times daily as needed.    . insulin aspart (NOVOLOG) 100 UNIT/ML injection Inject 10-15 Units into the skin 3 (three) times daily as needed for high blood sugar. Per sliding scale     . insulin NPH Human (NOVOLIN N) 100 UNIT/ML injection Inject 30 Units into the skin 2 (two) times daily.     . isosorbide mononitrate (IMDUR) 30 MG 24 hr tablet Take 90 mg by mouth daily at 6 (six) AM.    . nitroGLYCERIN (NITROSTAT) 0.4 MG SL tablet Place 0.4 mg under the tongue every 5 (five) minutes as needed for chest pain.    Marland Kitchen omeprazole (PRILOSEC) 20 MG capsule Take 20 mg by mouth daily.    . Oxycodone HCl 10 MG TABS Take 0.5 tablets (5 mg total) by mouth every 6 (six) hours as needed (pain). 8 tablet 0  . Probiotic Product (PROBIOTIC MULTI-ENZYME PO) Take 1 tablet by mouth daily.    . ranolazine (RANEXA) 500 MG 12 hr tablet Take 1 tablet (500 mg total) by mouth 2 (two) times daily. 60 tablet 0  .  rOPINIRole (REQUIP) 1 MG tablet Take 1  mg by mouth 2 (two) times daily.     . rosuvastatin (CRESTOR) 5 MG tablet Take 5 mg by mouth daily.  3  . tamsulosin (FLOMAX) 0.4 MG CAPS capsule Take 0.4 mg by mouth at bedtime.     . torsemide (DEMADEX) 100 MG tablet Take 1 tablet (100 mg total) by mouth daily. 30 tablet 0    Objective: BP (!) 163/58   Pulse 71   Temp 98 F (36.7 C) (Oral)   Resp 18   Ht 5\' 9"  (1.753 m)   Wt 165 lb (74.8 kg)   SpO2 95%   BMI 24.37 kg/m   Physical Exam  Constitutional: He is oriented to person, place, and time. He appears well-developed and well-nourished.  HENT:  Head: Normocephalic.  Scratch on top of head, clean, dry, healing  Eyes: Conjunctivae and EOM are normal.  Neck: Normal range of motion. JVD present.  Cardiovascular: Normal rate, regular rhythm and intact distal pulses. Exam reveals no gallop and no friction rub.  Systolic Murmur, mitral regurgitation  Pulmonary/Chest: Effort normal and breath sounds normal. No respiratory distress. He has no wheezes.  Abdominal: Soft. Bowel sounds are normal. He exhibits no distension.  Musculoskeletal: Normal range of motion. He exhibits edema (3+ pitting, bilateral LEs.). He exhibits no tenderness or deformity.  Neurological: He is alert and oriented to person, place, and time. No cranial nerve deficit. He exhibits normal muscle tone. Coordination normal.  Skin: Skin is warm and dry. Capillary refill takes less than 2 seconds. He is not diaphoretic.    Labs and Imaging: CBC BMET  Recent Labs  Lab 03/30/18 1302  WBC 14.8*  HGB 7.6*  HCT 22.9*  PLT 321   Recent Labs  Lab 03/30/18 1302  NA 123*  K 5.5*  CL 90*  CO2 23  BUN 131*  CREATININE 3.77*  GLUCOSE 787*  CALCIUM 8.4*     Dg Chest Port 1 View  Result Date: 03/30/2018 CLINICAL DATA:  Dry cough. EXAM: PORTABLE CHEST 1 VIEW COMPARISON:  Chest x-ray dated March 20, 2018. FINDINGS: Stable mild cardiomegaly status post CABG. Normal pulmonary vascularity. Atherosclerotic calcification  of the aortic arch. No focal consolidation, pleural effusion, or pneumothorax. No acute osseous abnormality. IMPRESSION: No active disease. Electronically Signed   By: Titus Dubin M.D.   On: 03/30/2018 13:50    Sherene Sires, DO 03/30/2018, 3:22 PM PGY-2, [facility] pager: [phone], text pages welcome

## 2018-03-30 NOTE — Progress Notes (Signed)
Skin assessment on admission to unit: Sacrum red, blanchable Small skin tag on R buttocks  Bilateral skin cracking/bleeding (mid-shin continuing down to feet)  Assisting w/ assessment: Dylan, RN   Delorise Jackson, RN

## 2018-03-30 NOTE — ED Provider Notes (Signed)
[place] EMERGENCY DEPARTMENT Provider Note   CSN: [id number] Arrival date & time: 03/30/18  1240     History   Chief Complaint Chief Complaint  Patient presents with  . Hyperglycemia    HPI Kyle [name] is a 77 y.o. male with a past medical history of CHF, CKD 4, hypertension, DM 2, status post CABG, who presents today for evaluation of hyperglycemia.  He has been on prednisone for bronchitis for the past few days.  He has not taken any insulin today.  He is supposed to be taking Novolin 30 units twice daily and NovoLog 5 units 3 times daily however reports he has not been taking these.  He reports that he just generally feels horrible and is having whole body cramps.  He reports that he cannot tell if his bronchitis is better or not.  He denies any chest pain or shortness of breath.  He has an AV fistula with the plan that he will need dialysis at some point.   He was given 600 cc of fluid by EMS in route.    HPI  Past Medical History:  Diagnosis Date  . Acute on chronic diastolic CHF (congestive heart failure) ([place]) 07/07/2016  . Acute respiratory failure with hypoxia ([place]) 03/19/2016  . AKI (acute kidney injury) ([place])   . Anemia   . Arthritis    "I'm eat up w/it" (07/08/2016)  . Atrial fibrillation ([place])   . BPH (benign prostatic hyperplasia)   . CAD (coronary artery disease)   . CAP (community acquired pneumonia) 03/19/2016  . Chronic anticoagulation 01/22/2016  . Chronic diastolic heart failure ([place]) 01/22/2016   Overview:  3 recent admissions with decompensated heart failure  . Chronic kidney disease, stage IV (severe) ([place])   . Chronic systolic (congestive) heart failure ([place])   . CKD (chronic kidney disease), stage IV ([place])   . Coronary artery disease involving native heart with angina pectoris [facility])    Overview:  coronary angiography July 2017 showed occlusion of his native coronary arteries and vein grafts and he is perfused by left thoracic  artery judged to be best treated medically.  . Depression with anxiety   . DM due to underlying condition with diabetic chronic kidney disease ([place])    Type 2  . Essential hypertension   . Fluid overload 03/19/2016  . GERD (gastroesophageal reflux disease)   . GERD (gastroesophageal reflux disease)   . Gout   . H/O coronary artery bypass surgery 03/23/2012  . Headache   . Heart failure ([place]) 03/24/2012  . HLD (hyperlipidemia)   . Hyperlipidemia   . Hypertensive heart disease with heart failure ([place]) 01/22/2016  . Myocardial infarction ([place])    "I've had several light heart attacks over this past year; might have had one this time" (07/08/2016)  . NSTEMI (non-ST elevated myocardial infarction) ([place]) 03/19/2016  . On home oxygen therapy    "2L prn" (07/08/2016)  . Pneumonia   . PONV (postoperative nausea and vomiting)   . Restless leg syndrome   . Shortness of breath   . Shortness of breath dyspnea   . Status post coronary artery stent placement 03/23/2012  . Uncontrolled type 2 diabetes mellitus with complication [facility])     Patient Active Problem List   Diagnosis Date Noted  . Hyperglycemia 03/30/2018  . Diarrhea 11/25/2017  . Hyperosmolar non-ketotic state in patient with type 2 diabetes mellitus ([place]) 11/24/2017  . CKD (chronic kidney disease) stage 4, GFR 15-29 ml/min (  [place]) 10/05/2017  . Anemia 10/05/2017  . Pre-operative cardiovascular examination 10/05/2017  . Shortness of breath   . Uncontrolled type 2 diabetes mellitus with complication ([place])   . Coronary artery disease involving native heart with angina pectoris ([place])   . Acute respiratory failure with hypoxia ([place]) 03/19/2016  . Fluid overload 03/19/2016  . CAD (coronary artery disease)   . Chronic kidney disease, stage IV (severe) ([place])   . Chronic atrial fibrillation ([place])   . GERD (gastroesophageal reflux disease)   . Essential hypertension   . Hyperlipidemia   . DM due to underlying condition with diabetic chronic  kidney disease ([place])   . BPH (benign prostatic hyperplasia)   . Depression with anxiety   . Chronic anticoagulation 01/22/2016  . Hypertensive heart disease with heart failure ([place]) 01/22/2016  . Chronic diastolic heart failure ([place]) 01/22/2016  . H/O coronary artery bypass surgery 03/23/2012  . Status post coronary artery stent placement 03/23/2012    Past Surgical History:  Procedure Laterality Date  . AV FISTULA PLACEMENT Right 07/13/2016   Procedure: ARTERIOVENOUS (AV) FISTULA CREATION RIGHT LOWER ARM;  Surgeon: [name], MD;  Location: [place];  Service: Vascular;  Laterality: Right;  . BASCILIC VEIN TRANSPOSITION Right 02/17/2017   Procedure: BASILIC VEIN TRANSPOSITION RIGHT ARM;  Surgeon: [name], MD;  Location: [place];  Service: Vascular;  Laterality: Right;  . CARDIAC CATHETERIZATION N/A 03/24/2016   Procedure: Right/Left Heart Cath and Coronary/Graft Angiography;  Surgeon: Troy Sine, MD;  Location: [place] CV LAB;  Service: Cardiovascular;  Laterality: N/A;  . CATARACT EXTRACTION W/ INTRAOCULAR LENS  IMPLANT, BILATERAL Bilateral   . COLONOSCOPY    . CORONARY ANGIOPLASTY WITH STENT PLACEMENT     "I've had a few; some w/stents" (07/08/2016)  . CORONARY ARTERY BYPASS GRAFT  ?1995   CABG X"4"        Home Medications    Prior to Admission medications   Medication Sig Start Date End Date Taking? Authorizing Provider  allopurinol (ZYLOPRIM) 100 MG tablet Take 100 mg by mouth every evening.    Yes [provider]  amLODipine (NORVASC) 5 MG tablet Take 5 mg by mouth every evening.   Yes [provider]  apixaban (ELIQUIS) 2.5 MG TABS tablet Take 1 tablet (2.5 mg total) by mouth 2 (two) times daily. 07/14/16  Yes Alvia Grove, PA-C  aspirin EC 81 MG tablet Take 81 mg by mouth every evening.    Yes [provider]  busPIRone (BUSPAR) 5 MG tablet Take 5 mg by mouth daily.   Yes [provider]  Calcium Carb-Cholecalciferol  (CALTRATE 600+D3 SOFT PO) Take 1 tablet by mouth every evening.    Yes [provider]  carvedilol (COREG) 25 MG tablet Take 25 mg by mouth 2 (two) times daily with a meal.   Yes [provider]  clopidogrel (PLAVIX) 75 MG tablet Take 75 mg by mouth daily.    Yes [provider]  cyanocobalamin (,VITAMIN B-12,) 1000 MCG/ML injection Inject 1,000 mcg into the muscle every 30 (thirty) days.   Yes [provider]  ferrous sulfate 325 (65 FE) MG tablet Take 325 mg by mouth daily with breakfast.   Yes [provider]  fexofenadine (ALLEGRA) 180 MG tablet Take 180 mg by mouth daily. 11/09/17  Yes [provider]  hydrALAZINE (APRESOLINE) 50 MG tablet Take 50 mg by mouth 3 (three) times daily.    Yes [provider]  hydrOXYzine (  ATARAX/VISTARIL) 25 MG tablet Take 25 mg by mouth 3 (three) times daily as needed for anxiety.    Yes [provider]  insulin aspart (NOVOLOG) 100 UNIT/ML injection Inject 5 Units into the skin 3 (three) times daily as needed for high blood sugar. Per sliding scale    Yes [provider]  insulin NPH Human (NOVOLIN N) 100 UNIT/ML injection Inject 30 Units into the skin 2 (two) times daily.    Yes [provider]  isosorbide mononitrate (IMDUR) 30 MG 24 hr tablet Take 30 mg by mouth 3 (three) times daily.    Yes [provider]  loperamide (IMODIUM) 2 MG capsule Take 2-4 mg by mouth every 4 (four) hours as needed for diarrhea or loose stools.   Yes [provider]  nitroGLYCERIN (NITROSTAT) 0.4 MG SL tablet Place 0.4 mg under the tongue every 5 (five) minutes as needed for chest pain.   Yes [provider]  omeprazole (PRILOSEC) 20 MG capsule Take 20 mg by mouth daily.   Yes [provider]  Oxycodone HCl 10 MG TABS Take 0.5 tablets (5 mg total) by mouth every 6 (six) hours as needed (pain). 02/17/17  Yes Rhyne, Hulen Shouts, PA-C  predniSONE (DELTASONE) 10 MG  tablet Take 10-40 mg by mouth See admin instructions. Take 4 tablets by mouth for 2 days, 3 tabs for 2 days, 2 tabs for 2 days, and 1 tab for 2 days 03/22/18  Yes [provider]  Probiotic Product (PROBIOTIC MULTI-ENZYME PO) Take 1 tablet by mouth daily.   Yes [provider]  ranolazine (RANEXA) 500 MG 12 hr tablet Take 1 tablet (500 mg total) by mouth 2 (two) times daily. 03/26/16  Yes Theodis Blaze, MD  rOPINIRole (REQUIP) 1 MG tablet Take 2 mg by mouth at bedtime.    Yes [provider]  rosuvastatin (CRESTOR) 5 MG tablet Take 5 mg by mouth daily. 08/25/17  Yes [provider]  tamsulosin (FLOMAX) 0.4 MG CAPS capsule Take 0.4 mg by mouth at bedtime.    Yes [provider]  torsemide (DEMADEX) 100 MG tablet Take 1 tablet (100 mg total) by mouth daily. 07/10/16  Yes Cherene Altes, MD    Family History Family History  Problem Relation Age of Onset  . Heart disease Mother   . CAD Mother   . Heart disease Father   . Diabetes Father   . Stroke Father   . Lung cancer Brother   . Diabetes Brother   . Diabetes Sister     Social History Social History   Tobacco Use  . Smoking status: Former Smoker    Packs/day: 2.00    Years: 12.00    Pack years: 24.00    Types: Cigarettes    Last attempt to quit: 1970    Years since quitting: 49.5  . Smokeless tobacco: Never Used  Substance Use Topics  . Alcohol use: Yes    Alcohol/week: 0.0 oz    Comment: 07/08/2016 nothing since 1970"  . Drug use: No     Allergies   No known allergies   Review of Systems Review of Systems  Constitutional: Positive for fatigue. Negative for fever.  Eyes: Negative for visual disturbance.  Respiratory: Positive for cough and shortness of breath.   Cardiovascular: Positive for leg swelling. Negative for chest pain and palpitations.  Gastrointestinal: Negative for abdominal pain, nausea and vomiting.  Endocrine: Positive for polydipsia and polyuria.    Musculoskeletal: Negative for  neck pain and neck stiffness.  Neurological: Positive for weakness (generalized).  All other systems reviewed and are negative.    Physical Exam Updated Vital Signs BP (!) 170/59 (BP Location: Left Arm)   Pulse 68   Temp 98 F (36.7 C) (Oral)   Resp 20   Ht 5\' 9"  (1.753 m)   Wt 74.8 kg (165 lb)   SpO2 99%   BMI 24.37 kg/m   Physical Exam  Constitutional: He is oriented to person, place, and time.  Appears uncomfortable  HENT:  Head: Normocephalic and atraumatic.  Mouth/Throat: Oropharynx is clear and moist.  Eyes: Conjunctivae are normal.  Neck: Normal range of motion. Neck supple.  Cardiovascular: Normal rate, regular rhythm, normal heart sounds and intact distal pulses.  No murmur heard. 2+ edema to bilateral lower extremities.  Pulmonary/Chest: Effort normal and breath sounds normal. No stridor. No respiratory distress. He has no wheezes. He has no rales. He exhibits no tenderness.  Abdominal: Soft. Bowel sounds are normal. He exhibits no distension. There is no tenderness. There is no guarding.  Musculoskeletal: He exhibits no edema or deformity.  Neurological: He is alert and oriented to person, place, and time.  Skin: Skin is warm and dry.  Psychiatric: He has a normal mood and affect. His behavior is normal.  Nursing note and vitals reviewed.    ED Treatments / Results  Labs (all labs ordered are listed, but only abnormal results are displayed) Labs Reviewed  COMPREHENSIVE METABOLIC PANEL - Abnormal; Notable for the following components:      Result Value   Sodium 123 (*)    Potassium 5.5 (*)    Chloride 90 (*)    Glucose, Bld 787 (*)    BUN 131 (*)    Creatinine, Ser 3.77 (*)    Calcium 8.4 (*)    Total Protein 5.1 (*)    Albumin 3.0 (*)    AST 14 (*)    GFR calc non Af Amer 14 (*)    GFR calc Af Amer 16 (*)    All other components within normal limits  CBC WITH DIFFERENTIAL/PLATELET - Abnormal; Notable for the  following components:   WBC 14.8 (*)    RBC 2.53 (*)    Hemoglobin 7.6 (*)    HCT 22.9 (*)    Neutro Abs 13.2 (*)    All other components within normal limits  URINALYSIS, ROUTINE W REFLEX MICROSCOPIC - Abnormal; Notable for the following components:   Color, Urine STRAW (*)    Glucose, UA >=500 (*)    Protein, ur 30 (*)    All other components within normal limits  CBG MONITORING, ED - Abnormal; Notable for the following components:   Glucose-Capillary >600 (*)    All other components within normal limits  CBG MONITORING, ED - Abnormal; Notable for the following components:   Glucose-Capillary >600 (*)    All other components within normal limits  I-STAT VENOUS BLOOD GAS, ED - Abnormal; Notable for the following components:   pCO2, Ven 34.0 (*)    pO2, Ven 25.0 (*)    All other components within normal limits  I-STAT TROPONIN, ED - Abnormal; Notable for the following components:   Troponin i, poc 0.09 (*)    All other components within normal limits  CBG MONITORING, ED - Abnormal; Notable for the following components:   Glucose-Capillary >600 (*)    All other components within normal limits  CBG MONITORING, ED - Abnormal; Notable for the following  components:   Glucose-Capillary >600 (*)    All other components within normal limits  MAGNESIUM  PHOSPHORUS    EKG EKG Interpretation  Date/Time:  Wednesday March 30 2018 12:53:25 EDT Ventricular Rate:  66 PR Interval:    QRS Duration: 132 QT Interval:  412 QTC Calculation: 432 R Axis:   108 Text Interpretation:  Right and left arm electrode reversal, interpretation assumes no reversal Sinus rhythm Consider left ventricular hypertrophy Repol abnrm, severe global ischemia (LM/MVD) Confirmed by Nat Christen [phone]) on 03/30/2018 12:57:26 PM   Radiology Dg Chest Port 1 View  Result Date: 03/30/2018 CLINICAL DATA:  Dry cough. EXAM: PORTABLE CHEST 1 VIEW COMPARISON:  Chest x-ray dated March 20, 2018. FINDINGS: Stable mild  cardiomegaly status post CABG. Normal pulmonary vascularity. Atherosclerotic calcification of the aortic arch. No focal consolidation, pleural effusion, or pneumothorax. No acute osseous abnormality. IMPRESSION: No active disease. Electronically Signed   By: Titus Dubin M.D.   On: 03/30/2018 13:50    Procedures Procedures   CRITICAL CARE Performed by: Wyn Quaker Total critical care time: 30 minutes Critical care time was exclusive of separately billable procedures and treating other patients. Critical care was necessary to treat or prevent imminent or life-threatening deterioration. Critical care was time spent personally by me on the following activities: development of treatment plan with patient and/or surrogate as well as nursing, discussions with consultants, evaluation of patient's response to treatment, examination of patient, obtaining history from patient or surrogate, ordering and performing treatments and interventions, ordering and review of laboratory studies, ordering and review of radiographic studies, pulse oximetry and re-evaluation of patient's condition.  Hyperglycemia requiring IV and SQ insulin, admission.   Medications Ordered in ED Medications  dextrose 5 %-0.45 % sodium chloride infusion (has no administration in time range)  insulin regular (NOVOLIN R,HUMULIN R) 100 Units in sodium chloride 0.9 % 100 mL (1 Units/mL) infusion (10.8 Units/hr Intravenous Rate/Dose Change 03/30/18 1606)  LORazepam (ATIVAN) injection 0.5 mg (0.5 mg Intravenous Given 03/30/18 1427)  0.9 %  sodium chloride infusion ( Intravenous New Bag/Given 03/30/18 1450)     Initial Impression / Assessment and Plan / ED Course  I have reviewed the triage vital signs and the nursing notes.  Pertinent labs & imaging results that were available during my care of the patient were reviewed by me and considered in my medical decision making (see chart for details).  Clinical Course as of Mar 30 1629   Wed Mar 30, 2018  1426 Paged nephrology   [EH]  [phone] Spoke with Dr. Florene Glen from nephrology who reports primary medicine issue and if needed hospitalist can consult nephrology.    [EH]  1439 Repeat EKG with deepening T inversions.  Will consult cardiology   [EH]  1500 Spoke with IMTS who will admit patient.    [EH]  1551 Spoke with Trish from cardiology    [EH]    Clinical Course User Index [EH] Lorin Glass, PA-C    Charlies Constable who presents today for evaluation of hyperglycemia.  He has generally not been feeling well.  He is on prednisone for bronchitis and has not been taking his insulin which I suspect is the cause of his hyperglycemia.  Blood glucose 787, VBG with normal pH and bicarb.  Urine without ketones, patient is still making urine.  He has an AV fistula in place with anticipation that he will at some point need dialysis.  The creatinine is slightly worse than his baseline, however  this is minimal, per family request nephrology was consulted.  EKG was obtained with repeat EKG showing concern for evolving T wave inversion and ST depression in V4 through 6.  Cardiology was consulted who will see patient.  Troponin is positive at 0.09, however chart review shows that he normally has some degree of positive troponin.  Patient hemodynamically stable while in the department.  He was given 600 cc of fluid by EMS in route, based on his heart failure history will not give additional bolus fluid, however he was started on glucose stabilizer, 10 units Sub Q, along with maintenance fluids.   Dr. Lacinda Axon who evaluated the patient.  I spoke with internal medicine resident who will admit patient.   Final Clinical Impressions(s) / ED Diagnoses   Final diagnoses:  Hyperglycemia    ED Discharge Orders    None       Ollen Gross 03/30/18 1636    Nat Christen, MD 03/31/18 1043

## 2018-03-30 NOTE — ED Notes (Addendum)
CBG collected by this tech. Result is "406." RN, Larene Beach, was notified.

## 2018-03-30 NOTE — ED Notes (Signed)
Admitting team in to assess 

## 2018-03-31 ENCOUNTER — Encounter (HOSPITAL_COMMUNITY): Payer: Self-pay | Admitting: Family Medicine

## 2018-03-31 ENCOUNTER — Other Ambulatory Visit: Payer: Self-pay | Admitting: Internal Medicine

## 2018-03-31 DIAGNOSIS — D473 Essential (hemorrhagic) thrombocythemia: Secondary | ICD-10-CM | POA: Diagnosis present

## 2018-03-31 DIAGNOSIS — I4891 Unspecified atrial fibrillation: Secondary | ICD-10-CM | POA: Diagnosis present

## 2018-03-31 DIAGNOSIS — N184 Chronic kidney disease, stage 4 (severe): Secondary | ICD-10-CM

## 2018-03-31 DIAGNOSIS — J449 Chronic obstructive pulmonary disease, unspecified: Secondary | ICD-10-CM | POA: Diagnosis present

## 2018-03-31 DIAGNOSIS — R413 Other amnesia: Secondary | ICD-10-CM | POA: Diagnosis present

## 2018-03-31 DIAGNOSIS — D75839 Thrombocytosis, unspecified: Secondary | ICD-10-CM | POA: Diagnosis present

## 2018-03-31 DIAGNOSIS — I5022 Chronic systolic (congestive) heart failure: Secondary | ICD-10-CM

## 2018-03-31 DIAGNOSIS — E1101 Type 2 diabetes mellitus with hyperosmolarity with coma: Secondary | ICD-10-CM

## 2018-03-31 DIAGNOSIS — D72829 Elevated white blood cell count, unspecified: Secondary | ICD-10-CM | POA: Diagnosis present

## 2018-03-31 LAB — BASIC METABOLIC PANEL
ANION GAP: 11 (ref 5–15)
Anion gap: 12 (ref 5–15)
BUN: 127 mg/dL — ABNORMAL HIGH (ref 8–23)
BUN: 116 mg/dL — AB (ref 8–23)
CALCIUM: 8.9 mg/dL (ref 8.9–10.3)
CHLORIDE: 99 mmol/L (ref 98–111)
CO2: 23 mmol/L (ref 22–32)
CO2: 23 mmol/L (ref 22–32)
CREATININE: 3.36 mg/dL — AB (ref 0.61–1.24)
Calcium: 8.9 mg/dL (ref 8.9–10.3)
Chloride: 99 mmol/L (ref 98–111)
Creatinine, Ser: 3.53 mg/dL — ABNORMAL HIGH (ref 0.61–1.24)
GFR calc Af Amer: 18 mL/min — ABNORMAL LOW (ref >60)
GFR calc Af Amer: 19 mL/min — ABNORMAL LOW (ref >60)
GFR, EST NON AFRICAN AMERICAN: 16 mL/min — AB (ref >60)
GFR, EST NON AFRICAN AMERICAN: 15 mL/min — AB (ref >60)
GLUCOSE: 175 mg/dL — AB (ref 70–99)
GLUCOSE: 151 mg/dL — AB (ref 70–99)
Potassium: 4.7 mmol/L (ref 3.5–5.1)
Potassium: 4.8 mmol/L (ref 3.5–5.1)
SODIUM: 134 mmol/L — AB (ref 135–145)
Sodium: 133 mmol/L — ABNORMAL LOW (ref 135–145)

## 2018-03-31 LAB — LIPID PANEL
Cholesterol: 171 mg/dL (ref 0–200)
HDL: 33 mg/dL — AB (ref >40)
LDL Cholesterol: 119 mg/dL — ABNORMAL HIGH (ref 0–99)
Total CHOL/HDL Ratio: 5.2 RATIO
Triglycerides: 96 mg/dL (ref <150)
VLDL: 19 mg/dL (ref 0–40)

## 2018-03-31 LAB — GLUCOSE, CAPILLARY
GLUCOSE-CAPILLARY: 150 mg/dL — AB (ref 70–99)
GLUCOSE-CAPILLARY: 278 mg/dL — AB (ref 70–99)
Glucose-Capillary: 151 mg/dL — ABNORMAL HIGH (ref 70–99)
Glucose-Capillary: 279 mg/dL — ABNORMAL HIGH (ref 70–99)
Glucose-Capillary: 226 mg/dL — ABNORMAL HIGH (ref 70–99)

## 2018-03-31 LAB — TSH: TSH: 1.818 u[IU]/mL (ref 0.350–4.500)

## 2018-03-31 LAB — OCCULT BLOOD X 1 CARD TO LAB, STOOL: FECAL OCCULT BLD: POSITIVE — AB

## 2018-03-31 LAB — CBC
HCT: 23.2 % — ABNORMAL LOW (ref 39.0–52.0)
Hemoglobin: 8 g/dL — ABNORMAL LOW (ref 13.0–17.0)
MCH: 30 pg (ref 26.0–34.0)
MCHC: 34.5 g/dL (ref 30.0–36.0)
MCV: 86.9 fL (ref 78.0–100.0)
PLATELETS: 457 10*3/uL — AB (ref 150–400)
RBC: 2.67 MIL/uL — AB (ref 4.22–5.81)
RDW: 15 % (ref 11.5–15.5)
WBC: 18.4 10*3/uL — AB (ref 4.0–10.5)

## 2018-03-31 LAB — TROPONIN I
TROPONIN I: 0.15 ng/mL — AB (ref <0.03)
Troponin I: 0.13 ng/mL (ref <0.03)

## 2018-03-31 MED ORDER — BUSPIRONE HCL 5 MG PO TABS: 5.0000 mg | ORAL_TABLET | Freq: Two times a day (BID) | ORAL | Status: DC

## 2018-03-31 MED ORDER — BUSPIRONE HCL 5 MG PO TABS: 5.0000 mg | ORAL_TABLET | Freq: Every day | ORAL | Status: DC

## 2018-03-31 MED ORDER — BUSPIRONE HCL 15 MG PO TABS
7.5000 mg | ORAL_TABLET | Freq: Two times a day (BID) | ORAL | Status: DC
  Filled 2018-03-31: qty 1

## 2018-03-31 MED ORDER — BUSPIRONE HCL 7.5 MG PO TABS: 7.5000 mg | ORAL_TABLET | Freq: Two times a day (BID) | ORAL | 0 refills | Status: AC

## 2018-03-31 MED ORDER — PANTOPRAZOLE SODIUM 40 MG PO TBEC
40.0000 mg | DELAYED_RELEASE_TABLET | Freq: Two times a day (BID) | ORAL | Status: DC
  Administered 2018-03-31: 40 mg via ORAL
  Filled 2018-03-31 (×2): qty 1

## 2018-03-31 MED ORDER — AMLODIPINE BESYLATE 10 MG PO TABS: 10.0000 mg | ORAL_TABLET | Freq: Every evening | ORAL | 0 refills | Status: AC

## 2018-03-31 MED ORDER — HYDROXYZINE HCL 10 MG PO TABS: 5.0000 mg | ORAL_TABLET | Freq: Three times a day (TID) | ORAL | Status: DC | PRN

## 2018-03-31 MED ORDER — BUSPIRONE HCL 15 MG PO TABS: 7.5000 mg | ORAL_TABLET | Freq: Every day | ORAL | Status: DC

## 2018-03-31 MED ORDER — PANTOPRAZOLE SODIUM 40 MG PO TBEC: 40.0000 mg | DELAYED_RELEASE_TABLET | Freq: Two times a day (BID) | ORAL | 0 refills | Status: AC

## 2018-03-31 MED ORDER — MELATONIN 3 MG PO TABS
6.0000 mg | ORAL_TABLET | Freq: Every day | ORAL | Status: DC
  Filled 2018-03-31: qty 2

## 2018-03-31 MED ORDER — AMLODIPINE BESYLATE 10 MG PO TABS
10.0000 mg | ORAL_TABLET | Freq: Every evening | ORAL | Status: DC
  Administered 2018-03-31: 10 mg via ORAL
  Filled 2018-03-31: qty 1

## 2018-03-31 MED ORDER — NON FORMULARY: 5.0000 mg | Freq: Every day | Status: DC

## 2018-03-31 MED ORDER — HYDROXYZINE HCL 10 MG PO TABS: 5.0000 mg | ORAL_TABLET | Freq: Three times a day (TID) | ORAL | 0 refills | Status: AC | PRN

## 2018-03-31 NOTE — Progress Notes (Addendum)
Family Medicine Teaching Service Daily Progress Note Intern Pager: [phone]  Patient name: Kyle [name] Medical record number: [id number] Date of birth: 1941-04-09 Age: 77 y.o. Gender: male  Primary Care Provider: Ocie Doyne., MD Consultants: Cardio, Nephro Code Status: Partial/DNI  Pt Overview and Major Events to Date:  7/17 - Admit  Assessment and Plan: Kyle [name] is a 77 y.o. male presenting with hyperosmolar hyperglycemia state. PMH is significant foruncontrolled T2DM, CKD Stage 4 with av fistula placed for HD, A. fib, GERD, HTN, HLD, BPH, CAD w. Angina, hx of bypass surgery, and HFrEF at 40%.  Hyperglycemia: CBG 787 > 151, Last A1C 14 in January '19. Home prescribed 30 U Novolin BID and 5 Novolog meal coverage TID, actually dosing is undetermined.  Recent steroid course for bronchitis. - Admit to ICU for insulin drip, Attending Dr. Mingo Amber. - Insulin infusion 0.01u/kg/hr, will attempt to use home novolin/novolog formulations when transferring off pump - IV NS 100cc/hr   - Physical assessment 2x shift for fluid status - CBG qACHS - BMPs q4 for K (5.5 on admission) and Na (133 corrected on admission)  AKI on CKD4, resolved: baseline Cr 3.7 > 3.53. Urination slowing down. Right fistula in anticipation of dialysis, follows with [facility]. - Nephro consulted - recommend insulin pump, IV fluids at 100cc, will see in AM - I&Os - Hold Torsemide 100 qDaily per cardio rec d/t hyperglycemia diuresis - Avoid nephrotoxic meds  CADw / elevated troponins: No chest pain.  ECG with potential ST changes in I, II, III over chronic changes in v4,v5, v6.   With hx of CABG and Stents, on ranexa, Aspirin, Carvedilol, Nitroglycerin.  Troponins 0.09 but lower than prior elevations from 10/17 and 7/17. -cardiology consulted -continuous cardiac monitor - Risk Labs: HbA1c, Lipid Panel, TSH -home Rosuvastatin 5mg  -home aspirin 81 -home plavix  -home nitro PRN -home ranexa  Anemia: 7.6 from  baseline of 10.2.  No bleeding complaints although he is on eliquis.  Troponin elevated above normal but not above baseline.  Respirating well although claims weakness.  Likely anemia of chronic disease. -discussed potential of transfusion with patient and he refused as he had a negative reaction to a prior transfusion.  He consents to transfusion in the case of "absolute emergency".  CHF: last echo showed HFrEF EF 40% (January 2019) with mitral regurg.  No lung crackles, respirating well on room air but 3+ edema throughout legs which patient stated was better than normal - Continue amlodipine incr to 10mg , carvedilol, imdur - Monitor I&Os  A. Fib without RVR: chronic, home apixaban, 2.5 - Cardiac monitoring - Apixaban  Hypertension: 150-160s sbp.  Chronic.  on home amlodipine, hydralazine, imdur - continue home meds  HLD:  - Lipid panel ordered - Rosuvastatin 5mg     GERD: takes Omeprazole - Pantoprazole  Anxiety: takes BuSpar, Hydroxyzine,  at home - continue home meds - Lorazepam 0.5mg  for acute issues  BPH:  - continue Tamsulosin  Gout: stable, no event currently -home allopurinol  Anxiety: home buspar/hydroxyzine, given ativan x1 in ED -home buspar/hydroxyzine  Iron deficiency: chronic, home ferous sulfate. Was anemic on admission to 7.6 which is down from prior baseline of mid 10's.  Chronic pain: stable, no pain complaints on admission.  home oxy 5prn  Resltess leg: stable on admission, home ropinorole  FEN/GI: NPO sips w/ meds, pantoprazole Prophylaxis: home Eliquis  Disposition: admit to ICU with tele  Subjective:  Patient is seen this morning resting in bed and reports doing  and feeling well. He denies headaches, fatigue, dizziness, fevers, chest pain, nausea, vomiting, diarrhea, urinary urgency and dysuria. He says he feels good enough to go home.  Objective: Temp:  [97.5 F (36.4 C)-98 F (36.7 C)] 97.9 F (36.6 C) (07/18 0357) Pulse Rate:   [50-72] 58 (07/18 0200) Resp:  [13-21] 15 (07/18 0200) BP: (133-173)/(52-126) 150/52 (07/18 0200) SpO2:  [93 %-100 %] 97 % (07/18 0200) Weight:  [165 lb (74.8 kg)] 165 lb (74.8 kg) (07/17 1255)   Physical Exam: Constitutional: He is oriented to person, place, and time. He appears well-developed and well-nourished.  HENT:  Head: Normocephalic.  Scratch on top of head, clean, dry, healing  Eyes: Conjunctivae and EOM are normal.  Neck: Normal range of motion.  Cardiovascular: Normal rate, regular rhythm and intact distal pulses. Exam reveals no gallop and no friction rub.  Systolic Murmur, mitral regurgitation  Pulmonary/Chest: Effort normal and breath sounds normal. No respiratory distress. He has no wheezes.  Abdominal: Soft. Bowel sounds are normal. He exhibits no distension.  Musculoskeletal: Normal range of motion. He exhibits edema (3+ pitting, bilateral LEs.). He exhibits no tenderness or deformity.  Neurological: He is alert and oriented to person, place, and time. No cranial nerve deficit. He exhibits normal muscle tone. Coordination normal.  Skin: Skin is warm and dry. Diffuse scaling erythematous rash in BLLE. Capillary refill takes less than 2 seconds. He is not diaphoretic.   Laboratory: Recent Labs  Lab 03/30/18 1302  WBC 14.8*  HGB 7.6*  HCT 22.9*  PLT 321   Recent Labs  Lab 03/30/18 1302 03/30/18 1755 03/30/18 2123 03/31/18 0036  NA 123* 131* 132* 133*  K 5.5* 4.3 4.4 4.7  CL 90* 94* 97* 99  CO2 23 22 22 23   BUN 131* 130* 129* 127*  CREATININE 3.77* 3.66* 3.48* 3.53*  CALCIUM 8.4* 9.1 9.1 8.9  PROT 5.1*  --   --   --   BILITOT 0.8  --   --   --   ALKPHOS 68  --   --   --   ALT 43  --   --   --   AST 14*  --   --   --   GLUCOSE 787* 394* 174* 151*   Urinalysis - 7/17    Component Value Date/Time   COLORURINE STRAW (A) 03/30/2018 1302   APPEARANCEUR CLEAR 03/30/2018 1302   LABSPEC 1.009 03/30/2018 1302   PHURINE 7.0 03/30/2018 1302   GLUCOSEU >=500  (A) 03/30/2018 1302   HGBUR NEGATIVE 03/30/2018 1302   BILIRUBINUR NEGATIVE 03/30/2018 1302   KETONESUR NEGATIVE 03/30/2018 1302   PROTEINUR 30 (A) 03/30/2018 1302   NITRITE NEGATIVE 03/30/2018 1302   LEUKOCYTESUR NEGATIVE 03/30/2018 1302   Troponin (7/17): 0.11 > 0.15 CBG (7/17): >600 > 280 > 157 > 134 > 151 (7/18) Lipid (7/18): pending TSH (7/18): pending HbA1c (7/18): pending Phos (7/18): 3.1 nml Mag (7/18): 2.1 nml  Imaging/Diagnostic Tests: Dg Chest Port 1 View: 03/30/2018 IMPRESSION: No active disease.   Daisy Floro, DO 03/31/2018, 6:53 AM PGY-1, [place] Intern pager: [phone], text pages welcome

## 2018-03-31 NOTE — Progress Notes (Signed)
Inpatient Diabetes Program Recommendations  AACE/ADA: New Consensus Statement on Inpatient Glycemic Control (2015)  Target Ranges:  Prepandial:   less than 140 mg/dL      Peak postprandial:   less than 180 mg/dL (1-2 hours)      Critically ill patients:  140 - 180 mg/dL   Spoke with patient and family about diabetes and home regimen for diabetes control. Patient reports that he is followed by his PCP for diabetes management. Per patient he takes his medicine all the time. Per family he has not. Wife reports she noticed he had not taken his meds some days. So at bedside said they will be the ones taking over his medications. They report forgetfulness, MD doing MiniCog assessment at bedside.   Asked patient and family if patient was on steroids back in March. They said no. A1c at that time 14.1%. Discussed the level with patient and family. Discussed A1c anf glucose goals.   Per wife and son patient drinks large quantites of butter milk and orange juice. But does not each much of anything else. Patient and wife report not checking glucose at home due to error messages will need a new meter at discharge.  Discussed importance of checking CBGs and maintaining good CBG control to prevent long-term and short-term complications. Explained how hyperglycemia leads to damage within blood vessels which lead to the common complications seen with uncontrolled diabetes. Stressed to the patient the importance of improving glycemic control to prevent further complications from uncontrolled diabetes. Patient verbalized understanding of information discussed and he states that he has no further questions at this time related to diabetes.   Thanks,  Tama Headings RN, MSN, [facility] Inpatient Diabetes Coordinator Team Pager [phone] (8a-5p)

## 2018-03-31 NOTE — Discharge Instructions (Signed)
°  Please STOP taking aspirin. Norvasc (amlodipine) was increased to 10 mg daily for high blood pressure control. Pantoprazole was increased to 40 mg twice a day to protect your GI tract in case there is bleeding there causing low blood counts. Buspar was increased to 7.5 mg twice a day- this is to be taken scheduled, not as needed, for anxiety.  Please see GI Tuesday Morning at 9:30 to discuss colonoscopy/endoscopy. Please see your PCP Monday or Tuesday to discuss blood sugar control. KEEP TRACK OF YOUR BLOOD SUGARS UNTIL THEN Please see your kidney doctor to follow up on kidney function.

## 2018-03-31 NOTE — Progress Notes (Signed)
Interim Progress Note  After rounding I met with the patient's wife and son. The son is concerned for some cognitive decline in his father over the past year or so and says the patient is non compliant with medications, and especially his insulin. The patient's wife encourages the patient to take his insulin but he often refuses and she doesn't push beyond that. The patient's son states that he and his sister will be more involved with the patient's medication and insulin management, but due to their jobs they cannot be available 24/7 to help. Home Health has been ordered.  MMSE was performed to record baseline cognition: 20/30.   Milus Banister, [place], PGY-1 03/31/2018 1:54 PM

## 2018-03-31 NOTE — Consult Note (Addendum)
[facility]  Renal Consult Note  HPI: I was asked by Dr. Mingo Amber to see Charlies Constable who is a 77 y.o. male with past medical history notable for poorly controlled insulin-dependent type 2 diabetes mellitus, CKD stage IV appearing for HD with AV fistula placed, atrial fibrillation on anticoagulation, GERD, HTN, HLD, BPH, CAD with previous CABG, and combined CHF with an LVEF of 40% who presented with 2 days of worsening fatigue and weakness determined to be in a hyperosmolar hyperglycemic state.  There was no associated acute renal injury in addition to the stage IV chronic kidney disease with a rising creatinine greater than 0.3 which however did not do very minimal alteration the patient's GFR. His renal function markedly improved with fluids with the minor alteration attributed to poor oral intake.  Past Medical History:  Diagnosis Date  . Acute respiratory failure with hypoxia ([place]) 03/19/2016  . AKI (acute kidney injury) ([place])   . Anemia   . Arthritis    "I'm eat up w/it" (07/08/2016)  . Atrial fibrillation (HCC)    a. CHA2DS2VASc = 6-->Eliquis.  Marland Kitchen BPH (benign prostatic hyperplasia)   . CAP (community acquired pneumonia) 03/19/2016  . Chronic anticoagulation 01/22/2016  . Chronic combined systolic (congestive) and diastolic (congestive) heart failure ([place])    a. 09/2017 Echo: EF 40%, distal septal/apical AK, prox inf AK. Restrictive diast filling. Mid LAD. Mod MR.  . Chronic kidney disease, stage IV (severe) ([place])   . Coronary artery disease involving native heart with angina pectoris ([place])    a. s/p prior MI's, stenting, and CABG x 3; b. 03/2016 Cath: LM nl, LAD 100p, 80d (after LIMA insertion), RI small, LCX 100ost, RCA 70p, 135mISR, rPDA fills via L->R collats. LIMA->LAD ok, Radial Artery->OM ok, VG->RPDA 100-->Med Rx recommended.  . Depression with anxiety   . DM due to underlying condition with diabetic chronic kidney disease ([place]    Type 2  . Essential hypertension   .  Fluid overload 03/19/2016  . GERD (gastroesophageal reflux disease)   . Gout   . H/O coronary artery bypass surgery 03/23/2012  . Headache   . Hyperlipidemia   . Hypertensive heart disease with heart failure ([place] 01/22/2016  . Moderate mitral regurgitation    a. 09/2017 Echo.  . Myocardial infarction ([place]    "I've had several light heart attacks over this past year; might have had one this time" (07/08/2016)  . NSTEMI (non-ST elevated myocardial infarction) ([place] 03/19/2016  . On home oxygen therapy    "2L prn" (07/08/2016)  . Pneumonia   . PONV (postoperative nausea and vomiting)   . Restless leg syndrome   . Status post coronary artery stent placement 03/23/2012  . Uncontrolled type 2 diabetes mellitus with complication ([facility]    Past Surgical History:  Procedure Laterality Date  . AV FISTULA PLACEMENT Right 07/13/2016   Procedure: ARTERIOVENOUS (AV) FISTULA CREATION RIGHT LOWER ARM;  Surgeon: TRosetta Posner MD;  [facility]  Service: Vascular;  Laterality: Right;  . BASCILIC VEIN TRANSPOSITION Right 02/17/2017   Procedure: BASILIC VEIN TRANSPOSITION RIGHT ARM;  Surgeon: ERosetta Posner MD;  Location[facility]  Service: Vascular;  Laterality: Right;  . CARDIAC CATHETERIZATION N/A 03/24/2016   Procedure: Right/Left Heart Cath and Coronary/Graft Angiography;  Surgeon: TTroy Sine MD;  [facility];  Service: Cardiovascular;  Laterality: N/A;  . CATARACT EXTRACTION W/ INTRAOCULAR LENS  IMPLANT, BILATERAL Bilateral   . COLONOSCOPY    .  CORONARY ANGIOPLASTY WITH STENT PLACEMENT     "I've had a few; some w/stents" (07/08/2016)  . CORONARY ARTERY BYPASS GRAFT  ?1995   CABG X"4"   Social History:  reports that he quit smoking about 49 years ago. His smoking use included cigarettes. He has a 24.00 pack-year smoking history. He has never used smokeless tobacco. He reports that he drinks alcohol. He reports that he does not use drugs. Allergies:  Allergies  Allergen Reactions   . No Known Allergies    Family History  Problem Relation Age of Onset  . Heart disease Mother   . CAD Mother   . Heart disease Father   . Diabetes Father   . Stroke Father   . Lung cancer Brother   . Diabetes Brother   . Diabetes Sister     Medications: I have reviewed the patient's current medications.   ROS: Negative except as per HPI. Blood pressure (!) 130/45, pulse 63, temperature 97.8 F (36.6 C), temperature source Oral, resp. rate 15, height _0  (1.753 m), weight 165 lb (74.8 kg), SpO2 100 %.  General appearance: alert Head: Normocephalic, without obvious abnormality, atraumatic Eyes: conjunctivae/corneas clear. PERRL, EOM's intact. Fundi benign. Ears: normal TM's and external ear canals both ears Resp: clear to auscultation bilaterally Chest wall: no tenderness Cardio: irregularly irregular rhythm GI: soft, non-tender; bowel sounds normal; no masses,  no organomegaly Extremities: edema Bilateral lower extremities more so on the right Neurologic: Alert and oriented X 3, normal strength and tone. Normal symmetric reflexes. Normal coordination and gait Results for orders placed or performed during the hospital encounter of 03/30/18 (from the past 48 hour(s))  CBG monitoring, ED     Status: Abnormal   Collection Time: 03/30/18 12:49 PM  Result Value Ref Range   Glucose-Capillary >600 (HH) 70 - 99 mg/dL   Comment 1 Notify RN   CBG monitoring, ED     Status: Abnormal   Collection Time: 03/30/18 12:49 PM  Result Value Ref Range   Glucose-Capillary >600 (HH) 70 - 99 mg/dL   Comment 1 Notify RN   Comprehensive metabolic panel     Status: Abnormal   Collection Time: 03/30/18  1:02 PM  Result Value Ref Range   Sodium 123 (L) 135 - 145 mmol/L   Potassium 5.5 (H) 3.5 - 5.1 mmol/L   Chloride 90 (L) 98 - 111 mmol/L    Comment: Please note change in reference range.   CO2 23 22 - 32 mmol/L   Glucose, Bld 787 (HH) 70 - 99 mg/dL    Comment: Please note change in  reference range. CRITICAL RESULT CALLED TO, READ BACK BY AND VERIFIED WITH: Bevelyn Ngo RN [phone] 03/30/2018 BY A BENNETT    BUN 131 (H) 8 - 23 mg/dL    Comment: Please note change in reference range.   Creatinine, Ser 3.77 (H) 0.61 - 1.24 mg/dL   Calcium 8.4 (L) 8.9 - 10.3 mg/dL   Total Protein 5.1 (L) 6.5 - 8.1 g/dL   Albumin 3.0 (L) 3.5 - 5.0 g/dL   AST 14 (L) 15 - 41 U/L   ALT 43 0 - 44 U/L    Comment: Please note change in reference range.   Alkaline Phosphatase 68 38 - 126 U/L   Total Bilirubin 0.8 0.3 - 1.2 mg/dL   GFR calc non Af Amer 14 (L) >60 mL/min   GFR calc Af Amer 16 (L) >60 mL/min    Comment: (NOTE) The eGFR has been  calculated using the CKD EPI equation. This calculation has not been validated in all clinical situations. eGFR's persistently <60 mL/min signify possible Chronic Kidney Disease.    Anion gap 10 5 - 15    Comment: Performed at [facility] [address], [place], [place] [address]  CBC with Differential     Status: Abnormal   Collection Time: 03/30/18  1:02 PM  Result Value Ref Range   WBC 14.8 (H) 4.0 - 10.5 K/uL   RBC 2.53 (L) 4.22 - 5.81 MIL/uL   Hemoglobin 7.6 (L) 13.0 - 17.0 g/dL   HCT 22.9 (L) 39.0 - 52.0 %   MCV 90.5 78.0 - 100.0 fL   MCH 30.0 26.0 - 34.0 pg   MCHC 33.2 30.0 - 36.0 g/dL   RDW 14.6 11.5 - 15.5 %   Platelets 321 150 - 400 K/uL   Neutrophils Relative % 89 %   Neutro Abs 13.2 (H) 1.7 - 7.7 K/uL   Lymphocytes Relative 5 %   Lymphs Abs 0.7 0.7 - 4.0 K/uL   Monocytes Relative 5 %   Monocytes Absolute 0.8 0.1 - 1.0 K/uL   Eosinophils Relative 0 %   Eosinophils Absolute 0.0 0.0 - 0.7 K/uL   Basophils Relative 0 %   Basophils Absolute 0.0 0.0 - 0.1 K/uL   Immature Granulocytes 1 %   Abs Immature Granulocytes 0.1 0.0 - 0.1 K/uL    Comment: Performed at [facility], [address] [place], [place] [address]  Urinalysis, Routine w reflex microscopic     Status: Abnormal   Collection Time: 03/30/18  1:02 PM  Result  Value Ref Range   Color, Urine STRAW (A) YELLOW   APPearance CLEAR CLEAR   Specific Gravity, Urine 1.009 1.005 - 1.030   pH 7.0 5.0 - 8.0   Glucose, UA >=500 (A) NEGATIVE mg/dL   Hgb urine dipstick NEGATIVE NEGATIVE   Bilirubin Urine NEGATIVE NEGATIVE   Ketones, ur NEGATIVE NEGATIVE mg/dL   Protein, ur 30 (A) NEGATIVE mg/dL   Nitrite NEGATIVE NEGATIVE   Leukocytes, UA NEGATIVE NEGATIVE   WBC, UA 0-5 0 - 5 WBC/hpf   Bacteria, UA NONE SEEN NONE SEEN    Comment: Performed at [facility] [address], [place], [place] [address]  Magnesium     Status: None   Collection Time: 03/30/18  1:02 PM  Result Value Ref Range   Magnesium 2.1 1.7 - 2.4 mg/dL    Comment: Performed at [facility], [place] [address] [place], [place] [address]  I-Stat venous blood gas, ED     Status: Abnormal   Collection Time: 03/30/18  1:19 PM  Result Value Ref Range   pH, Ven 7.414 7.250 - 7.430   pCO2, Ven 34.0 (L) 44.0 - 60.0 mmHg   pO2, Ven 25.0 (LL) 32.0 - 45.0 mmHg   Bicarbonate 21.7 20.0 - 28.0 mmol/L   TCO2 23 22 - 32 mmol/L   O2 Saturation 48.0 %   Acid-base deficit 2.0 0.0 - 2.0 mmol/L   Patient temperature HIDE    Sample type VENOUS    Comment NOTIFIED PHYSICIAN   I-stat troponin, ED     Status: Abnormal   Collection Time: 03/30/18  1:25 PM  Result Value Ref Range   Troponin i, poc 0.09 (HH) 0.00 - 0.08 ng/mL   Comment NOTIFIED PHYSICIAN    Comment 3            Comment: Due  to the release kinetics of cTnI, a negative result within the first hours of the onset of symptoms does not rule out myocardial infarction with certainty. If myocardial infarction is still suspected, repeat the test at appropriate intervals.   CBG monitoring, ED     Status: Abnormal   Collection Time: 03/30/18  2:58 PM  Result Value Ref Range   Glucose-Capillary >600 (HH) 70 - 99 mg/dL  CBG monitoring, ED     Status: Abnormal   Collection Time: 03/30/18  4:05 PM  Result Value Ref Range    Glucose-Capillary >600 (HH) 70 - 99 mg/dL  CBG monitoring, ED     Status: Abnormal   Collection Time: 03/30/18  5:35 PM  Result Value Ref Range   Glucose-Capillary 406 (H) 70 - 99 mg/dL   Comment 1 Notify RN   Phosphorus     Status: None   Collection Time: 03/30/18  5:55 PM  Result Value Ref Range   Phosphorus 3.1 2.5 - 4.6 mg/dL    Comment: Performed at [facility], [place] [address] [place], [place] [address]  Basic metabolic panel     Status: Abnormal   Collection Time: 03/30/18  5:55 PM  Result Value Ref Range   Sodium 131 (L) 135 - 145 mmol/L   Potassium 4.3 3.5 - 5.1 mmol/L   Chloride 94 (L) 98 - 111 mmol/L    Comment: Please note change in reference range.   CO2 22 22 - 32 mmol/L   Glucose, Bld 394 (H) 70 - 99 mg/dL    Comment: Please note change in reference range.   BUN 130 (H) 8 - 23 mg/dL    Comment: Please note change in reference range.   Creatinine, Ser 3.66 (H) 0.61 - 1.24 mg/dL   Calcium 9.1 8.9 - 10.3 mg/dL   GFR calc non Af Amer 15 (L) >60 mL/min   GFR calc Af Amer 17 (L) >60 mL/min    Comment: (NOTE) The eGFR has been calculated using the CKD EPI equation. This calculation has not been validated in all clinical situations. eGFR's persistently <60 mL/min signify possible Chronic Kidney Disease.    Anion gap 15 5 - 15    Comment: Performed at [facility] [address], [place], [place] [address]  Troponin I     Status: Abnormal   Collection Time: 03/30/18  5:55 PM  Result Value Ref Range   Troponin I 0.11 (HH) <0.03 ng/mL    Comment: CRITICAL RESULT CALLED TO, READ BACK BY AND VERIFIED WITH: Delaine Lame [id number] 03/30/18 D BRADLEY Performed at [facility] [address], [place],  [address]   CBG monitoring, ED     Status: Abnormal   Collection Time: 03/30/18  6:34 PM  Result Value Ref Range   Glucose-Capillary 280 (H) 70 - 99 mg/dL  MRSA PCR Screening     Status: None   Collection Time: 03/30/18  6:52 PM  Result Value Ref  Range   MRSA by PCR NEGATIVE NEGATIVE    Comment:        The GeneXpert MRSA Assay (FDA approved for NASAL specimens only), is one component of a comprehensive MRSA colonization surveillance program. It is not intended to diagnose MRSA infection nor to guide or monitor treatment for MRSA infections. Performed at [facility], [place] [address] [place], [place] [address]   Glucose, capillary     Status: Abnormal   Collection Time: 03/30/18  7:37 PM  Result Value Ref Range   Glucose-Capillary 157 (H) 70 - 99 mg/dL  Basic metabolic panel     Status: Abnormal   Collection Time: 03/30/18  9:23 PM  Result Value Ref Range   Sodium 132 (L) 135 - 145 mmol/L   Potassium 4.4 3.5 - 5.1 mmol/L   Chloride 97 (L) 98 - 111 mmol/L    Comment: Please note change in reference range.   CO2 22 22 - 32 mmol/L   Glucose, Bld 174 (H) 70 - 99 mg/dL    Comment: Please note change in reference range.   BUN 129 (H) 8 - 23 mg/dL    Comment: Please note change in reference range.   Creatinine, Ser 3.48 (H) 0.61 - 1.24 mg/dL   Calcium 9.1 8.9 - 10.3 mg/dL   GFR calc non Af Amer 15 (L) >60 mL/min   GFR calc Af Amer 18 (L) >60 mL/min    Comment: (NOTE) The eGFR has been calculated using the CKD EPI equation. This calculation has not been validated in all clinical situations. eGFR's persistently <60 mL/min signify possible Chronic Kidney Disease.    Anion gap 13 5 - 15    Comment: Performed at [facility] [address], [place], [place] [address]  Glucose, capillary     Status: Abnormal   Collection Time: 03/30/18 11:27 PM  Result Value Ref Range   Glucose-Capillary 134 (H) 70 - 99 mg/dL  Basic metabolic panel     Status: Abnormal   Collection Time: 03/31/18 12:36 AM  Result Value Ref Range   Sodium 133 (L) 135 - 145 mmol/L   Potassium 4.7 3.5 - 5.1 mmol/L   Chloride 99 98 - 111 mmol/L    Comment: Please note change in reference range.   CO2 23 22 - 32 mmol/L   Glucose, Bld 151 (H) 70  - 99 mg/dL    Comment: Please note change in reference range.   BUN 127 (H) 8 - 23 mg/dL    Comment: Please note change in reference range.   Creatinine, Ser 3.53 (H) 0.61 - 1.24 mg/dL   Calcium 8.9 8.9 - 10.3 mg/dL   GFR calc non Af Amer 15 (L) >60 mL/min   GFR calc Af Amer 18 (L) >60 mL/min    Comment: (NOTE) The eGFR has been calculated using the CKD EPI equation. This calculation has not been validated in all clinical situations. eGFR's persistently <60 mL/min signify possible Chronic Kidney Disease.    Anion gap 11 5 - 15    Comment: Performed at [facility] [address], [place], [place] [address]  Troponin I     Status: Abnormal   Collection Time: 03/31/18 12:36 AM  Result Value Ref Range   Troponin I 0.15 (HH) <0.03 ng/mL    Comment: CRITICAL VALUE NOTED.  VALUE IS CONSISTENT WITH PREVIOUSLY REPORTED AND CALLED VALUE. Performed at [facility], [place] [address] [place], [place] [address]   Glucose, capillary     Status: Abnormal   Collection Time: 03/31/18  3:35 AM  Result Value Ref Range   Glucose-Capillary 151 (H) 70 - 99 mg/dL  Lipid panel     Status: Abnormal   Collection Time: 03/31/18  5:51 AM  Result Value Ref Range   Cholesterol 171 0 - 200 mg/dL   Triglycerides 96 <150 mg/dL   HDL 33 (L) >40 mg/dL   Total CHOL/HDL Ratio 5.2 RATIO   VLDL 19 0 - 40  mg/dL   LDL Cholesterol 119 (H) 0 - 99 mg/dL    Comment:        Total Cholesterol/HDL:CHD Risk Coronary Heart Disease Risk Table                     Men   Women  1/2 Average Risk   3.4   3.3  Average Risk       5.0   4.4  2 X Average Risk   9.6   7.1  3 X Average Risk  23.4   11.0        Use the calculated Patient Ratio above and the CHD Risk Table to determine the patient's CHD Risk.        ATP III CLASSIFICATION (LDL):  <100     mg/dL   Optimal  100-129  mg/dL   Near or Above                    Optimal  130-159  mg/dL   Borderline  160-189  mg/dL   High  >190     mg/dL   Very  High Performed at [facility] [address] [place], [place] [address]   TSH     Status: None   Collection Time: 03/31/18  5:51 AM  Result Value Ref Range   TSH 1.818 0.350 - 4.500 uIU/mL    Comment: Performed by a 3rd Generation assay with a functional sensitivity of <=0.01 uIU/mL. Performed at [facility], [place] [address] [place], [place] [address]   Troponin I     Status: Abnormal   Collection Time: 03/31/18  5:51 AM  Result Value Ref Range   Troponin I 0.13 (HH) <0.03 ng/mL    Comment: CRITICAL VALUE NOTED.  VALUE IS CONSISTENT WITH PREVIOUSLY REPORTED AND CALLED VALUE. Performed at [facility], [place] [address] [place], [place] [address]   CBC     Status: Abnormal   Collection Time: 03/31/18  5:51 AM  Result Value Ref Range   WBC 18.4 (H) 4.0 - 10.5 K/uL   RBC 2.67 (L) 4.22 - 5.81 MIL/uL   Hemoglobin 8.0 (L) 13.0 - 17.0 g/dL   HCT 23.2 (L) 39.0 - 52.0 %   MCV 86.9 78.0 - 100.0 fL   MCH 30.0 26.0 - 34.0 pg   MCHC 34.5 30.0 - 36.0 g/dL   RDW 15.0 11.5 - 15.5 %   Platelets 457 (H) 150 - 400 K/uL    Comment: Performed at [facility], [place] [address], [place], [place] [address]  Glucose, capillary     Status: Abnormal   Collection Time: 03/31/18  7:55 AM  Result Value Ref Range   Glucose-Capillary 150 (H) 70 - 99 mg/dL  Occult blood card to lab, stool RN will collect     Status: Abnormal   Collection Time: 03/31/18  8:25 AM  Result Value Ref Range   Fecal Occult Bld POSITIVE (A) NEGATIVE    Comment: Performed at [facility] [address] [place], [place] [address]  Basic metabolic panel     Status: Abnormal   Collection Time: 03/31/18  9:11 AM  Result Value Ref Range   Sodium 134 (L) 135 - 145 mmol/L   Potassium 4.8 3.5 - 5.1 mmol/L   Chloride 99 98 - 111 mmol/L    Comment: Please note change in reference range.   CO2 23 22 - 32 mmol/L  Glucose, Bld 175 (H) 70 - 99 mg/dL    Comment: Please note change in reference range.   BUN  116 (H) 8 - 23 mg/dL    Comment: Please note change in reference range.   Creatinine, Ser 3.36 (H) 0.61 - 1.24 mg/dL   Calcium 8.9 8.9 - 10.3 mg/dL   GFR calc non Af Amer 16 (L) >60 mL/min   GFR calc Af Amer 19 (L) >60 mL/min    Comment: (NOTE) The eGFR has been calculated using the CKD EPI equation. This calculation has not been validated in all clinical situations. eGFR's persistently <60 mL/min signify possible Chronic Kidney Disease.    Anion gap 12 5 - 15    Comment: Performed at [facility] [address] [place], [place] [address]  Glucose, capillary     Status: Abnormal   Collection Time: 03/31/18 11:51 AM  Result Value Ref Range   Glucose-Capillary 278 (H) 70 - 99 mg/dL   Dg Chest Port 1 View  Result Date: 03/30/2018 CLINICAL DATA:  Dry cough. EXAM: PORTABLE CHEST 1 VIEW COMPARISON:  Chest x-ray dated March 20, 2018. FINDINGS: Stable mild cardiomegaly status post CABG. Normal pulmonary vascularity. Atherosclerotic calcification of the aortic arch. No focal consolidation, pleural effusion, or pneumothorax. No acute osseous abnormality. IMPRESSION: No active disease. Electronically Signed   By: Titus Dubin M.D.   On: 03/30/2018 13:50   Assessment: Kyle [name] is a 77 year old male with past medical history notable for poorly controlled insulin-dependent type 2 diabetes mellitus, CKD stage IV appearing for HD with AV fistula placed, atrial fibrillation on anticoagulation, GERD, HTN, HLD, BPH, CAD with previous CABG, and combined CHF with an LVEF of 40% who presented with 2 days of worsening fatigue and weakness determined to be in a hyperosmolar hyperglycemic state.  There was no associated acute renal injury in addition to the stage IV chronic kidney disease with a rising creatinine greater than 0.3 which however did not do very minimal alteration the patient's GFR. His renal function markedly improved with fluids with the minor alteration attributed to poor oral intake.    Plan: 1 CKD stage IV: Highly symptomatic CKD stage IV with patient endorsing severe pruritis, persistent nausea, muscle aches, confusion and generally unwell. The patient would possibly benefit from dialysis given his BUN and symptoms but has opted to defer treatment at this time. His son stated clearly that his feels well for days prior to a few days of generalized fatigue. This behavior is cyclic in nature but given his general wellbeing at this stage they agree that deferring HD for now is likely the best option. -He will be given an appointment with Dr. Posey Pronto his nephrologist to follow-up an continue the discussion.  -Agree with continuing sliding scale insulin to better control the patient's hyperglycemia and transitioning to his home regimen prior to discharge.  -Agree with holding nephrotoxic agents.  -Renal function improving -Minimal weight changes as per chart review but feel that the weight recorded in the ED is likely to be inaccurate.  -Daily weights and strict I/Os should be recorded   2 DMII: As per primary team. We briefly discussed insulin pens vs individual with administration of his insulin.  The patient feels that his [place] insurance would not cover insulin pens but will discuss this with the primary care provider if he agrees this is a superior alternative.  Kathi Ludwig, MD Internal Medicine PGY-2 Pager # [phone]  Patient seen and examined, agree with  above note with above modifications. 77 year old WM with longstanding CKD- crt over 3 for at least a year.  He had followed with Dr. Posey Pronto at [facility] like clockwork but last seen by him in September of 2018- I think thru fault of our office he was lost to follow up.  He has an AVF in place that appears usable but had not felt to have dialysis indications.  He was admitted to hospital on 7/17 for confusion thought to be related to sugar.  He has improved and primary team wants to send him home.  I talked to him because BUN  is over 100 and albumin is down about uremic symptoms which I think he does have on some level and I explained that to pt and family.  However, there are no acute indications for HD and he does not feel he needs to initiate at this time.  He was quite anemic and received transfusion so may benefit from ESA and iron.  Since being d/cd right now will make arrangements for him to follow up with Dr. Posey Pronto as OP for further discussion on these issues Corliss Parish, MD 03/31/2018

## 2018-03-31 NOTE — Progress Notes (Signed)
[facility]  Patient Status: Active (receiving services up to time of hospitalization)  [facility] is providing the following services: RN  If patient discharges after hours, please call [phone].   Kyle [name] 03/31/2018, 9:42 AM

## 2018-03-31 NOTE — Consult Note (Signed)
   [facility] Inpatient Consult   03/31/2018  Kyle [name] Feb 01, 1941 [id number]  Patient was assessed for [facility] for community services. Patient was previously active with [facility].  Met with patient and son Kyle [name] at bedside regarding being restarted with [facility] services. Son states my mom said "someone spoke with her last week but we weren't quite sure how all of that works. Wanted to be care from the scammers."  Son preferred the information be given to his mother and sent the packet with [facility] information with him.  Will have [place] coach to call at a later date.  Patient was getting dressed to go home.    Of note, [facility] Care Management services does not replace or interfere with any services that are arranged by inpatient case management or social work. For additional questions or referrals please contact:  Natividad Brood, RN BSN [facility] Liaison  [phone] business mobile phone Toll free office [phone]

## 2018-03-31 NOTE — Progress Notes (Signed)
Inpatient Diabetes Program Recommendations  AACE/ADA: New Consensus Statement on Inpatient Glycemic Control (2015)  Target Ranges:  Prepandial:   less than 140 mg/dL      Peak postprandial:   less than 180 mg/dL (1-2 hours)      Critically ill patients:  140 - 180 mg/dL   Review of Glycemic Control  Diabetes history: DM 2 Outpatient Diabetes medications: NPH 30 units BID, Novolog 5 units tid with meals Current orders for Inpatient glycemic control: Novolog 0-15 units Q4 hours  Inpatient Diabetes Program Recommendations:    Admitting glucose 787. Per H&P patient on steroids causing hyperglycemia spikes at home and on admission.   Noted admitting Hgb low at 7.6, A1c level would not be accurate at this time will need follow up once Hgb is back up to normal.  Last A1c 14.1% during an ED visit in which patient left AMA on 11/25/17. Doubt patient is taking insulin as prescribed. Hgb 10.2 at that time.  Thanks,  Tama Headings RN, MSN, BC-ADM, [facility] Inpatient Diabetes Coordinator Team Pager [phone] (8a-5p)

## 2018-03-31 NOTE — Progress Notes (Signed)
Patient discharged to home with son. After visit Summary reviewed. Patient capable of reverbalizing medications and follow up visits. No signs and symptoms of distress noted. Patient educated to return to the ED in the case of an emergency. Casimiro Lienhard RN 

## 2018-03-31 NOTE — Progress Notes (Signed)
New Admission Note:   Arrival Method: Bed Mental Orientation:  Telemetry: 44M rm 19  Assessment: Completed Skin: bruise on both arms ,left butt cheek  Stage 2.  Dry flaky skin on both legs, scab on right hand 3rd finger. IV: IV on left arm  Pain: 0/10  Tubes: Safety Measures: Safety Fall Prevention Plan has been given, discussed and signed Admission: Completed [facility] Orientation: Patient has been orientated to the room, unit and staff.  Family: Wife daughter-in-law and son   Orders have been reviewed and implemented. Will continue to monitor the patient. Call light has been placed within reach and bed alarm has been activated.   Jontue Crumpacker RN [place] [facility] Phone: [phone]

## 2018-03-31 NOTE — Progress Notes (Signed)
Subjective:  Denies SSCP, palpitations or Dyspnea Feels much better than yesterday   Objective:  Vitals:   03/31/18 0357 03/31/18 0700 03/31/18 0759 03/31/18 0856  BP:  (!) 155/72  (!) 161/59  Pulse:  (!) 57    Resp:  14    Temp: 97.9 F (36.6 C)  97.6 F (36.4 C)   TempSrc: Oral  Oral   SpO2:  97%    Weight:      Height:        Intake/Output from previous day:  Intake/Output Summary (Last 24 hours) at 03/31/2018 1751 Last data filed at 03/31/2018 0258 Gross per 24 hour  Intake 1253.98 ml  Output 1150 ml  Net 103.98 ml    Physical Exam: Affect appropriate Healthy:  appears stated age HEENT: normal Neck supple with no adenopathy JVP normal no bruits no thyromegaly Lungs clear with no wheezing and good diaphragmatic motion Heart:  S1/S2 no murmur, no rub, gallop or click PMI normal Abdomen: benighn, BS positve, no tenderness, no AAA no bruit.  No HSM or HJR Distal pulses intact with no bruits Plus one bilateral  edema Neuro non-focal Skin warm and dry No muscular weakness   Lab Results: Basic Metabolic Panel: Recent Labs    03/30/18 1302 03/30/18 1755 03/30/18 2123 03/31/18 0036  NA 123* 131* 132* 133*  K 5.5* 4.3 4.4 4.7  CL 90* 94* 97* 99  CO2 23 22 22 23   GLUCOSE 787* 394* 174* 151*  BUN 131* 130* 129* 127*  CREATININE 3.77* 3.66* 3.48* 3.53*  CALCIUM 8.4* 9.1 9.1 8.9  MG 2.1  --   --   --   PHOS  --  3.1  --   --    Liver Function Tests: Recent Labs    03/30/18 1302  AST 14*  ALT 43  ALKPHOS 68  BILITOT 0.8  PROT 5.1*  ALBUMIN 3.0*   No results for input(s): LIPASE, AMYLASE in the last 72 hours. CBC: Recent Labs    03/30/18 1302 03/31/18 0551  WBC 14.8* 18.4*  NEUTROABS 13.2*  --   HGB 7.6* 8.0*  HCT 22.9* 23.2*  MCV 90.5 86.9  PLT 321 457*   Cardiac Enzymes: Recent Labs    03/30/18 1755 03/31/18 0036 03/31/18 0551  TROPONINI 0.11* 0.15* 0.13*   BNP: Invalid input(s): POCBNP D-Dimer: No results for input(s):  DDIMER in the last 72 hours. Hemoglobin A1C: No results for input(s): HGBA1C in the last 72 hours. Fasting Lipid Panel: Recent Labs    03/31/18 0551  CHOL 171  HDL 33*  LDLCALC 119*  TRIG 96  CHOLHDL 5.2   Thyroid Function Tests: Recent Labs    03/31/18 0551  TSH 1.818    Imaging: Dg Chest Port 1 View  Result Date: 03/30/2018 CLINICAL DATA:  Dry cough. EXAM: PORTABLE CHEST 1 VIEW COMPARISON:  Chest x-ray dated March 20, 2018. FINDINGS: Stable mild cardiomegaly status post CABG. Normal pulmonary vascularity. Atherosclerotic calcification of the aortic arch. No focal consolidation, pleural effusion, or pneumothorax. No acute osseous abnormality. IMPRESSION: No active disease. Electronically Signed   By: Titus Dubin M.D.   On: 03/30/2018 13:50    Cardiac Studies:  ECG: SR inferolateral T wave changes    Telemetry:  NSR 03/31/2018   Echo: none recent   Medications:   . allopurinol  100 mg Oral QPM  . amLODipine  5 mg Oral QPM  . aspirin EC  81 mg Oral QPM  . busPIRone  5  mg Oral Daily  . calcium carbonate  1 tablet Oral Q breakfast  . carvedilol  25 mg Oral BID WC  . clopidogrel  75 mg Oral Daily  . ferrous sulfate  325 mg Oral Q breakfast  . hydrALAZINE  50 mg Oral TID  . insulin aspart  0-15 Units Subcutaneous Q4H  . isosorbide mononitrate  30 mg Oral TID  . pantoprazole  40 mg Oral BID  . ranolazine  500 mg Oral BID  . rOPINIRole  2 mg Oral QHS  . rosuvastatin  5 mg Oral QHS  . tamsulosin  0.4 mg Oral QHS  . torsemide  100 mg Oral Daily      Assessment/Plan:   Elevated Troponin:  In setting hyperglycemia and CRF. See note by Dr Meda Coffee continue medical Rx no indication For further evaluation no evolution max .15 Continue ranolazine and nitrates   DM:  BS improving per primary service given hyperglycemic diuresis and CRF his torsemide should be held today And reassess in am. I d/c it   CRF:  3.53 today avoid contrast or diagnostic heat cath if possible    HLD continue statin    Will sign off   Jenkins Rouge 03/31/2018, 9:04 AM

## 2018-04-01 LAB — HEMOGLOBIN A1C
Hgb A1c MFr Bld: 12 % — ABNORMAL HIGH (ref 4.8–5.6)
Mean Plasma Glucose: 298 mg/dL

## 2018-04-02 NOTE — Discharge Summary (Signed)
[facility] Discharge Summary  Patient name: Kyle [name] Medical record number: [id number] Date of birth: Apr 01, 1941 Age: 77 y.o. Gender: male Date of Admission: 03/30/2018  Date of Discharge: 03/31/2018 Admitting Physician: Sherene Sires, DO  Primary Care Provider: Ocie Doyne., MD Consultants: Cardio, Nephro  Indication for Hospitalization: Hyperosmolar Hyperglycemic State  Discharge Diagnoses/Problem List:  Uncontrolled T2DM CKD 4 GI Bleed CAD Anemia 2/2 Iron Deficiency and Chronic Kidney Disease CHF A. Fib without RVR Hypertension HLD GERD Anxiety BPH Gout  Disposition: Discharge home with close outpatient follow-up  Discharge Condition: Stable  Discharge Exam:  Constitutional: He isoriented to person, place, and time. He appearswell-developedand well-nourished.  HENT:  Head:Normocephalic. Scratch on top of head, clean, dry, healing Eyes:Conjunctivaeand EOMare normal.  Neck:Normal range of motion. Cardiovascular:Normal rate,regular rhythmand intact distal pulses. Exam revealsno gallopand no friction rub. Systolic Murmur, mitral regurgitation Pulmonary/Chest:Effort normaland breath sounds normal. Norespiratory distress. He hasno wheezes.  Abdominal:Soft.Bowel sounds are normal. He exhibitsno distension.  Musculoskeletal:Normal range of motion. He exhibitsstable edema(3+ pitting, bilateral LEs.). He exhibits notendernessor deformity.  Neurological: He isalertand oriented to person, place, and time. Nocranial nerve deficit. He exhibitsnormal muscle tone.Coordinationnormal.  Skin: Skin iswarmand dry. Diffuse scaling erythematous rash in BLLE. Capillary refill takesless than 2 seconds. He isnot diaphoretic.   Brief Hospital Course:  [name] is a 77 year old man who presented to the ED with fatigue and malaise from HHS. Because of the patient's CKD4, HFrEF and history of CAD with stents Nephro and  Cardio were consulted. He was treated with insulin infusion and minimal fluids to decrease his blood glucose. This treatment was successful and decreased the blood sugar to the 150-160s. Diabetes Education was also conducted for the patient and patient's family, who are becoming more involved in his care. On physical exam and from his labs he was noted to be anemic. FOBT was positive for blood in stool, but the patient was otherwise stable and was discharged with appointments for GI follow up.  Issues for Follow Up:  1. See GI regarding positive fecal occult blood test. 2. Make lifestyle modifications to improve diet and reduce carbohydrate intake. 3. Monitor blood sugar and take insulin as prescribed by your physician. 4. Maintain close follow up with nephrology and cardiology for management of chronic heart and kidney conditions.  Significant Procedures: None  Significant Labs and Imaging:  Recent Labs  Lab 03/30/18 1302 03/31/18 0551  WBC 14.8* 18.4*  HGB 7.6* 8.0*  HCT 22.9* 23.2*  PLT 321 457*   Recent Labs  Lab 03/30/18 1302 03/30/18 1755 03/30/18 2123 03/31/18 0036 03/31/18 0911  NA 123* 131* 132* 133* 134*  K 5.5* 4.3 4.4 4.7 4.8  CL 90* 94* 97* 99 99  CO2 23 22 22 23 23   GLUCOSE 787* 394* 174* 151* 175*  BUN 131* 130* 129* 127* 116*  CREATININE 3.77* 3.66* 3.48* 3.53* 3.36*  CALCIUM 8.4* 9.1 9.1 8.9 8.9  MG 2.1  --   --   --   --   PHOS  --  3.1  --   --   --   ALKPHOS 68  --   --   --   --   AST 14*  --   --   --   --   ALT 43  --   --   --   --   ALBUMIN 3.0*  --   --   --   --    Troponin (7/17): 0.11 >  0.15 CBG (7/17): >600 > 280 > 157 > 134 > 151 (7/18) Lipid (7/18): pending TSH (7/18): pending HbA1c (7/18): pending Phos (7/18): 3.1 nml Mag (7/18): 2.1 nml FOBT (7/18): positive  Results/Tests Pending at Time of Discharge: none  Discharge Medications:  Allergies as of 03/31/2018      Reactions   No Known Allergies       Medication List    STOP  taking these medications   aspirin EC 81 MG tablet   omeprazole 20 MG capsule Commonly known as:  PRILOSEC Replaced by:  pantoprazole 40 MG tablet   Oxycodone HCl 10 MG Tabs     TAKE these medications   allopurinol 100 MG tablet Commonly known as:  ZYLOPRIM Take 100 mg by mouth every evening.   amLODipine 10 MG tablet Commonly known as:  NORVASC Take 1 tablet (10 mg total) by mouth every evening. What changed:    medication strength  how much to take   apixaban 2.5 MG Tabs tablet Commonly known as:  ELIQUIS Take 1 tablet (2.5 mg total) by mouth 2 (two) times daily.   busPIRone 7.5 MG tablet Commonly known as:  BUSPAR Take 1 tablet (7.5 mg total) by mouth 2 (two) times daily. What changed:    medication strength  how much to take   CALTRATE 600+D3 SOFT PO Take 1 tablet by mouth every evening.   carvedilol 25 MG tablet Commonly known as:  COREG Take 25 mg by mouth 2 (two) times daily with a meal.   clopidogrel 75 MG tablet Commonly known as:  PLAVIX Take 75 mg by mouth every evening.   cyanocobalamin 1000 MCG/ML injection Commonly known as:  (VITAMIN B-12) Inject 1,000 mcg into the muscle every 30 (thirty) days.   vitamin B-12 1000 MCG tablet Commonly known as:  CYANOCOBALAMIN Take 2,000 mcg by mouth daily.   ferrous sulfate 325 (65 FE) MG tablet Take 325 mg by mouth daily with breakfast.   fexofenadine 180 MG tablet Commonly known as:  ALLEGRA Take 180 mg by mouth daily.   hydrALAZINE 50 MG tablet Commonly known as:  APRESOLINE Take 50 mg by mouth 3 (three) times daily.   hydrOXYzine 10 MG tablet Commonly known as:  ATARAX/VISTARIL Take 0.5 tablets (5 mg total) by mouth 3 (three) times daily as needed for anxiety. What changed:    medication strength  how much to take   insulin aspart 100 UNIT/ML injection Commonly known as:  novoLOG Inject 5 Units into the skin 3 (three) times daily as needed for high blood sugar. Per sliding scale    isosorbide mononitrate 30 MG 24 hr tablet Commonly known as:  IMDUR Take 30 mg by mouth 3 (three) times daily.   loperamide 2 MG capsule Commonly known as:  IMODIUM Take 2-4 mg by mouth every 4 (four) hours as needed for diarrhea or loose stools.   nitroGLYCERIN 0.4 MG SL tablet Commonly known as:  NITROSTAT Place 0.4 mg under the tongue every 5 (five) minutes as needed for chest pain.   NOVOLIN N 100 UNIT/ML injection Generic drug:  insulin NPH Human Inject 30 Units into the skin 2 (two) times daily.   pantoprazole 40 MG tablet Commonly known as:  PROTONIX Take 1 tablet (40 mg total) by mouth 2 (two) times daily. Replaces:  omeprazole 20 MG capsule   PROBIOTIC MULTI-ENZYME PO Take 1 tablet by mouth daily.   ranolazine 500 MG 12 hr tablet Commonly known as:  RANEXA Take 1 tablet (  500 mg total) by mouth 2 (two) times daily.   rOPINIRole 1 MG tablet Commonly known as:  REQUIP Take 2 mg by mouth at bedtime as needed (RLS).   rosuvastatin 5 MG tablet Commonly known as:  CRESTOR Take 5 mg by mouth every evening.   tamsulosin 0.4 MG Caps capsule Commonly known as:  FLOMAX Take 0.4 mg by mouth at bedtime.   torsemide 100 MG tablet Commonly known as:  DEMADEX Take 1 tablet (100 mg total) by mouth daily.      Discharge Instructions: Please refer to Patient Instructions section of EMR for full details.  Patient was counseled important signs and symptoms that should prompt return to medical care, changes in medications, dietary instructions, activity restrictions, and follow up appointments.   Follow-Up Appointments: Follow-up Information    Clarene Essex, MD. Go on 04/05/2018.   Specialty:  Gastroenterology Why:  at 9:30 am  Contact information: [address] [place] [address] [phone]        Elmarie Shiley, MD. Schedule an appointment as soon as possible for a visit.   Specialty:  Nephrology Contact information: [place]  [address] [phone]        Ocie Doyne., MD. Schedule an appointment as soon as possible for a visit.   Specialty:  Family Medicine Contact information: [place] [address] [phone]        RevankarReita Cliche, MD .   Specialty:  Cardiology Contact information: [place] [address] [phone]          Daisy Floro, DO 04/02/2018, 11:09 AM PGY-1, [place]

## 2018-04-04 ENCOUNTER — Other Ambulatory Visit: Payer: Self-pay | Admitting: *Deleted

## 2018-04-04 ENCOUNTER — Encounter: Payer: Self-pay | Admitting: *Deleted

## 2018-04-04 NOTE — Patient Outreach (Signed)
[place] [facility]) Care Management  04/04/2018  ADDIS TUOHY 28-Apr-1941 [id number]   Telephone Screen  Referral Date:  04/01/2018 Referral Source:  Hospital Liaison Reason for Referral:  Offer [facility] Services Insurance:  [employer]   Outreach Attempt:  Outreach attempt #1 to patient for telephone screening. Patient recently screened and discharged from hospitalization.  Has received [facility] information in mail and was given information to take home to review from hospital.  Spoke with patient who request, RN Health Coach speak with his wife.  HIPAA verified with wife per patient request.  Wife stating her daughter is reviewing [facility] information and request she return call back later this afternoon.  Plan:  RN Health Coach will await return call back from daughter or patient.  If no return call back, [place] will make another outreach attempt within the next 4 days.   [place] [phone] [email or web address]

## 2018-04-04 NOTE — Patient Outreach (Signed)
[place] [facility]) Care Management  04/04/2018  Kyle [name] [birth year] [id number]   Telephone Screen  Referral Date:  04/01/2018 Referral Source:  Hospital Liaison Reason for Referral:  Offer [facility] Services Insurance:  Health Team [employer]   Outreach Attempt:  Received telephone call back from daughter, Kyle [name] (Release of Information on File).  HIPAA verified with daughter.  [facility] services reviewed and discussed with daughter.  Daughter states she does have [facility] information packet from [facility] and would like to read over those prior to making a decision.  [name] request to give [place] telephone call back once she reviews material.  Plan:  [place] will await telephone call back from daughter, Kyle [name].  If no telephone call back, will outreach to patient or daughter within the next 10 business days.  [place] [phone] [name]

## 2018-04-05 ENCOUNTER — Other Ambulatory Visit: Payer: Self-pay

## 2018-04-05 ENCOUNTER — Other Ambulatory Visit: Payer: Self-pay | Admitting: *Deleted

## 2018-04-05 ENCOUNTER — Telehealth: Payer: Self-pay | Admitting: Medical Oncology

## 2018-04-05 NOTE — Telephone Encounter (Signed)
Kyle [name] is doing fine today . Does he need to see Julien Nordmann today ?  She thanked the infusion nurses for caring for him yesterday. Pt does not need to see Julien Nordmann today and Dory Peru was to relay message.

## 2018-04-05 NOTE — Patient Outreach (Signed)
[place] [facility]) Care Management  04/05/2018  Kyle [name] 03-Jan-1941 [id number]   Case Closure  Referral Date:04/01/2018 Referral Source:Hospital Liaison Reason for Referral:Offer [facility] Services Insurance:Health Team Advantage   Outreach Attempt:  Received telephone call back from daughter, Kyle [name] (Release of information is on file).  HIPAA verified with daughter.  Daughter stating at this time they are declining [facility] services.  [name], daughter is asking about Medicaid process.  Offered [facility] Social Work assistance with this process.  Daughter stating she needs to discuss with her parents and her siblings and declines [facility] Social Work referral.  Discussed with daughter [facility] Pharmacist [name] has been trying to reach her to complete Medication Reconciliation process.  Daughter states she has left a message for [place].  RN Health Coach encouraged daughter to contact [facility] in the future if she feels patient will benefit from services.  Plan:  RN Health Coach will close case based on patient/daughter declining services at this time.  [place] [phone] [name]

## 2018-04-05 NOTE — Patient Outreach (Signed)
[facility]) Care Management  04/05/2018  Kyle [name] Aug 27, 1941 [id number]  77 year old male outreached by Kyle [name] services for 30 day post discharge medication review.  PMHx includes, but not limited to, chronic systolic heart failure, atrial fibrillation, hypertension, COPD, GERD, Type 2 diabetes mellitus, BPH, CKD Stage IV, and hyperlipidemia.  Successful outreach attempt to Kyle [name]' wife, Kyle [name].  HIPAA identifiers verified.  Kyle [name] requests that I speak with their daughter, Kyle [name] (Release of Information on File) who handles Kyle [name]' medications.   Left Kyle [name] a HIPAA compliant voice message requesting a return call.   Plan: Outreach attempt #2 in 3-4 business days.  Kyle [name], PharmD Clinical Pharmacist [place] [phone]

## 2018-04-06 DIAGNOSIS — Z09 Encounter for follow-up examination after completed treatment for conditions other than malignant neoplasm: Secondary | ICD-10-CM | POA: Diagnosis not present

## 2018-04-06 DIAGNOSIS — S81802A Unspecified open wound, left lower leg, initial encounter: Secondary | ICD-10-CM | POA: Diagnosis not present

## 2018-04-06 DIAGNOSIS — G2581 Restless legs syndrome: Secondary | ICD-10-CM | POA: Diagnosis not present

## 2018-04-06 DIAGNOSIS — E785 Hyperlipidemia, unspecified: Secondary | ICD-10-CM | POA: Diagnosis not present

## 2018-04-06 DIAGNOSIS — E118 Type 2 diabetes mellitus with unspecified complications: Secondary | ICD-10-CM | POA: Diagnosis not present

## 2018-04-06 DIAGNOSIS — Z1339 Encounter for screening examination for other mental health and behavioral disorders: Secondary | ICD-10-CM | POA: Diagnosis not present

## 2018-04-06 DIAGNOSIS — Z872 Personal history of diseases of the skin and subcutaneous tissue: Secondary | ICD-10-CM | POA: Diagnosis not present

## 2018-04-06 DIAGNOSIS — S81801A Unspecified open wound, right lower leg, initial encounter: Secondary | ICD-10-CM | POA: Diagnosis not present

## 2018-04-06 DIAGNOSIS — I4891 Unspecified atrial fibrillation: Secondary | ICD-10-CM | POA: Diagnosis not present

## 2018-04-06 DIAGNOSIS — M109 Gout, unspecified: Secondary | ICD-10-CM | POA: Diagnosis not present

## 2018-04-06 DIAGNOSIS — I509 Heart failure, unspecified: Secondary | ICD-10-CM | POA: Diagnosis not present

## 2018-04-06 DIAGNOSIS — D649 Anemia, unspecified: Secondary | ICD-10-CM | POA: Diagnosis not present

## 2018-04-06 DIAGNOSIS — I2510 Atherosclerotic heart disease of native coronary artery without angina pectoris: Secondary | ICD-10-CM | POA: Diagnosis not present

## 2018-04-06 DIAGNOSIS — K210 Gastro-esophageal reflux disease with esophagitis: Secondary | ICD-10-CM | POA: Diagnosis not present

## 2018-04-06 DIAGNOSIS — N401 Enlarged prostate with lower urinary tract symptoms: Secondary | ICD-10-CM | POA: Diagnosis not present

## 2018-04-06 DIAGNOSIS — Z8679 Personal history of other diseases of the circulatory system: Secondary | ICD-10-CM | POA: Diagnosis not present

## 2018-04-06 DIAGNOSIS — Z79899 Other long term (current) drug therapy: Secondary | ICD-10-CM | POA: Diagnosis not present

## 2018-04-07 ENCOUNTER — Other Ambulatory Visit: Payer: Self-pay

## 2018-04-07 NOTE — Patient Outreach (Addendum)
[facility]) Care Management  [place]   04/07/2018  Kyle [name] 26-Jan-1941 [id number]   77 year old male outreached by [place] services for 30 day post discharge medication review.  PMHx includes, but not limited to, chronic systolic heart failure, atrial fibrillation, hypertension, COPD, GERD, Type 2 diabetes mellitus, BPH, CKD Stage IV, and hyperlipidemia.  Successful outreach attempt to Mr. Kyle [name] daughter, Kyle [name].  HIPAA identifiers verified.  Subjective: Ms. Kyle [name] states that she lives near her parents and helps manage her father's medications.  She states that he is "doing much better."  She reports that her biggest concern is trying to get dad to eat regularly.  She says he has a poor appetite and that his "sugars are all over the place."  She states that his CBG log is at his house, but that his glucose ranges between 130-300 mg/dL.)  She reports an isolated value of 499 mg/dL this week.  Daughter states that she is "now in charge of managing his sugars" for the last two weeks.  She states that she is checking his glucose, watching what he eats and giving him his insulin. She says he is up most of the night and napping during the day.  She reports that she thinks that he may have early Alzheimer's and have to give up the church where he is the [facility].  She reports not affordability issues at this time and gets some of his medications from the [place].  She agreed to have him enrolled with [facility] for diabetes coaching.   Objective:  HgA1c 12% on 03/24/18 down from 14.1% in March SCr 3.36 mg/dL from 03/31/18 stable  Current Medications: Current Outpatient Medications  Medication Sig Dispense Refill  . allopurinol (ZYLOPRIM) 100 MG tablet Take 100 mg by mouth every evening.     Marland Kitchen amLODipine (NORVASC) 10 MG tablet Take 1 tablet (10 mg total) by mouth every evening. 30 tablet 0  . apixaban (ELIQUIS) 2.5 MG TABS tablet Take 1 tablet  (2.5 mg total) by mouth 2 (two) times daily. 60 tablet 0  . busPIRone (BUSPAR) 7.5 MG tablet Take 1 tablet (7.5 mg total) by mouth 2 (two) times daily. 60 tablet 0  . Calcium Carb-Cholecalciferol (CALTRATE 600+D3 SOFT PO) Take 1 tablet by mouth every evening.     . carvedilol (COREG) 25 MG tablet Take 25 mg by mouth 2 (two) times daily with a meal.    . clopidogrel (PLAVIX) 75 MG tablet Take 75 mg by mouth every evening.     . ferrous sulfate 325 (65 FE) MG tablet Take 325 mg by mouth daily with breakfast.    . fexofenadine (ALLEGRA) 180 MG tablet Take 180 mg by mouth daily.  3  . hydrALAZINE (APRESOLINE) 50 MG tablet Take 50 mg by mouth 3 (three) times daily. Pt. Takes twice daily.    . insulin aspart (NOVOLOG) 100 UNIT/ML injection Inject 5-12 Units into the skin every 4 (four) hours as needed for high blood sugar (SSI). Per sliding scale     . insulin NPH Human (NOVOLIN N) 100 UNIT/ML injection Inject 30 Units into the skin 2 (two) times daily.     . isosorbide mononitrate (IMDUR) 30 MG 24 hr tablet Take 30 mg by mouth 2 (two) times daily.     Marland Kitchen LORazepam (ATIVAN) 0.5 MG tablet Take 0.5 mg by mouth daily as needed for anxiety.    . nitroGLYCERIN (NITROSTAT) 0.4 MG SL tablet Place  0.4 mg under the tongue every 5 (five) minutes as needed for chest pain.    . pantoprazole (PROTONIX) 40 MG tablet Take 1 tablet (40 mg total) by mouth 2 (two) times daily. 60 tablet 0  . Probiotic Product (PROBIOTIC MULTI-ENZYME PO) Take 1 tablet by mouth daily.    . ranolazine (RANEXA) 500 MG 12 hr tablet Take 1 tablet (500 mg total) by mouth 2 (two) times daily. 60 tablet 0  . rOPINIRole (REQUIP) 1 MG tablet Take 2 mg by mouth at bedtime as needed (RLS).     Marland Kitchen rosuvastatin (CRESTOR) 5 MG tablet Take 5 mg by mouth every evening.   3  . tamsulosin (FLOMAX) 0.4 MG CAPS capsule Take 0.4 mg by mouth at bedtime.     . torsemide (DEMADEX) 100 MG tablet Take 1 tablet (100 mg total) by mouth daily. 30 tablet 0  . vitamin  B-12 (CYANOCOBALAMIN) 1000 MCG tablet Take 2,000 mcg by mouth daily.    . hydrOXYzine (ATARAX/VISTARIL) 10 MG tablet Take 0.5 tablets (5 mg total) by mouth 3 (three) times daily as needed for anxiety. (Patient not taking: Reported on 04/07/2018) 30 tablet 0   No current facility-administered medications for this visit.    Functional Status: No flowsheet data found.  Fall/Depression Screening: No flowsheet data found. No flowsheet data found.  ASSESSMENT: Date Discharged from Hospital: 03/31/18 Date Medication Reconciliation Performed: 04/07/2018  Medications Discontinued at Discharge:   Omeprazole  Aspirin  Oxycodone   New Medications at Discharge:   amlodipine (dose change)  Buspirone (dose change)  Hydroxyzine (dose change)  Pantoprazole   Patient was recently discharged from hospital and all medications have been reviewed  Drugs sorted by system:  Neurologic/Psychologic: buspirone, hydroxyine, lorazepam  Cardiovascular: amlodipine, apixaban, carvedilol, clopidogrel, hydralazine, isosorbide mononitirate, nitroglycerin, ranolazine, ropinirole, rosuvastatin, torsemide  Pulmonary/Allergy: fexofenadine  Gastrointestinal: pantoprazole, probiotic  Endocrine: novolog, NPH  Vitamins/Minerals: calcium carbonate with D, ferrous sulfate, vitamin B-12   Miscellaneous: allopurinol, tamsulosin  Medications to avoid in the elderly:  Per the [place] [employer], hydroxyzine is highly anticholinergic and clearance is reduced with advanced age. Risk of confusion, dry mouth, constipation and other anticholinergic effects or toxicity may occur.  There is strong evidence to avoid use in the elderly.  Drug interactions:  Concurrent use of clopidogrel and apixaban have increased risk of bleeding.   Other issues noted:  Isosorbide mononitrate 30 mg (Imdur) with directions as 1 tablet three times daily.   Patient reports taking only twice daily, but Imdur's usual frequency is once  daily.  Hydralazine is prescribed as three times daily, but patient only takes twice daily.  Daughter says he is usually not home of for midday dose.   Apixaban: Use in patients with serum creatine  >2.5mg /dL were excluded from clinical trials.   Buspirone: Contraindicated in severe renal impairment.  Estimated CrCl 18 ml/min.  Plan: Route note to PCP, Dr. Micheal Likens.  Route note to Dr. Geraldo Pitter to clarify Isosorbide mononitrate dose.   Refer to [facility] telephonic RN for diabetes management.   Joetta Manners, PharmD Clinical Pharmacist [place] [phone]

## 2018-04-11 ENCOUNTER — Ambulatory Visit: Payer: PPO | Admitting: *Deleted

## 2018-04-11 ENCOUNTER — Other Ambulatory Visit: Payer: Self-pay

## 2018-04-12 ENCOUNTER — Ambulatory Visit: Payer: PPO | Admitting: Vascular Surgery

## 2018-04-12 ENCOUNTER — Other Ambulatory Visit: Payer: Self-pay

## 2018-04-12 ENCOUNTER — Encounter: Payer: Self-pay | Admitting: Vascular Surgery

## 2018-04-12 VITALS — BP 123/56 | HR 72 | Temp 97.0°F | Resp 16 | Ht 68.0 in | Wt 167.0 lb

## 2018-04-12 DIAGNOSIS — M7989 Other specified soft tissue disorders: Secondary | ICD-10-CM

## 2018-04-12 NOTE — Progress Notes (Signed)
Vascular and Vein Specialist of [place]  Patient name: Kyle [name] MRN: [id number] DOB: 1941-08-23 Sex: male  REASON FOR VISIT: Dilation of lower extremity swelling and ulcerations.  HPI: Kyle [name] is a 77 y.o. male known to me from prior right upper arm basilic vein transposition fistula in June 2018.  He reports that he is still not on hemodialysis.  He has had ulcerations over both lower extremities which is been seen in the [facility] with healing.  He was having wrapping at the time of the wound treatment but is not using lower extremity wraps or compression is currently.  He did undergo noninvasive studies at [facility].  He had calcified vessels making ankle arm index unreliable.  He also underwent formal venous lower extremity studies including reflux study showing no significant reflux and no evidence of DVT.  All of the studies were May of this year when he had active ulceration.  He is here today for continued discussion.  He has no pain associated with this.  He does report that he sits a great deal of time in a recliner and actually sleeps there as well.  Past Medical History:  Diagnosis Date  . Acute respiratory failure with hypoxia ([place]) 03/19/2016  . Acute respiratory failure with hypoxia ([place]) 03/19/2016  . AKI (acute kidney injury) ([place])   . Anemia   . Arthritis    "I'm eat up w/it" (07/08/2016)  . Atrial fibrillation (HCC)    a. CHA2DS2VASc = 6-->Eliquis.  Marland Kitchen BPH (benign prostatic hyperplasia)   . CAD (coronary artery disease)   . CAP (community acquired pneumonia) 03/19/2016  . Chronic anticoagulation 01/22/2016  . Chronic combined systolic (congestive) and diastolic (congestive) heart failure ([place])    a. 09/2017 Echo: EF 40%, distal septal/apical AK, prox inf AK. Restrictive diast filling. Mid LAD. Mod MR.  . Chronic diastolic heart failure ([place]) 01/22/2016   Overview:  3 recent admissions with decompensated heart  failure  . Chronic kidney disease, stage IV (severe) ([place])   . COPD (chronic obstructive pulmonary disease) ([place])   . Coronary artery disease involving native heart with angina pectoris ([place])    a. s/p prior MI's, stenting, and CABG x 3; b. 03/2016 Cath: LM nl, LAD 100p, 80d (after LIMA insertion), RI small, LCX 100ost, RCA 70p, 137m ISR, rPDA fills via L->R collats. LIMA->LAD ok, Radial Artery->OM ok, VG->RPDA 100-->Med Rx recommended.  . Depression with anxiety   . DM due to underlying condition with diabetic chronic kidney disease ([place])    Type 2  . Essential hypertension   . Fluid overload 03/19/2016  . GERD (gastroesophageal reflux disease)   . Gout   . H/O coronary artery bypass surgery 03/23/2012  . Headache   . Hyperlipidemia   . Hyperosmolar non-ketotic state in patient with type 2 diabetes mellitus ([place]) 11/24/2017  . Hypertensive heart disease with heart failure ([place]) 01/22/2016  . Moderate mitral regurgitation    a. 09/2017 Echo.  . Myocardial infarction ([place])    "I've had several light heart attacks over this past year; might have had one this time" (07/08/2016)  . NSTEMI (non-ST elevated myocardial infarction) ([place]) 03/19/2016  . On home oxygen therapy    "2L prn" (07/08/2016)  . Pneumonia   . PONV (postoperative nausea and vomiting)   . Restless leg syndrome   . Status post coronary artery stent placement 03/23/2012  . Uncontrolled type 2 diabetes mellitus with complication [facility])     Family  History  Problem Relation Age of Onset  . Heart disease Mother   . CAD Mother   . Heart disease Father   . Diabetes Father   . Stroke Father   . Lung cancer Brother   . Diabetes Brother   . Diabetes Sister     SOCIAL HISTORY: Social History   Tobacco Use  . Smoking status: Former Smoker    Packs/day: 2.00    Years: 12.00    Pack years: 24.00    Types: Cigarettes    Last attempt to quit: 1970    Years since quitting: 49.6  . Smokeless tobacco: Never Used  Substance Use  Topics  . Alcohol use: Yes    Alcohol/week: 0.0 oz    Comment: 07/08/2016 nothing since 1970"    Allergies  Allergen Reactions  . No Known Allergies     Current Outpatient Medications  Medication Sig Dispense Refill  . allopurinol (ZYLOPRIM) 100 MG tablet Take 100 mg by mouth every evening.     Marland Kitchen amLODipine (NORVASC) 10 MG tablet Take 1 tablet (10 mg total) by mouth every evening. 30 tablet 0  . apixaban (ELIQUIS) 2.5 MG TABS tablet Take 1 tablet (2.5 mg total) by mouth 2 (two) times daily. 60 tablet 0  . busPIRone (BUSPAR) 7.5 MG tablet Take 1 tablet (7.5 mg total) by mouth 2 (two) times daily. 60 tablet 0  . Calcium Carb-Cholecalciferol (CALTRATE 600+D3 SOFT PO) Take 1 tablet by mouth every evening.     . carvedilol (COREG) 25 MG tablet Take 25 mg by mouth 2 (two) times daily with a meal.    . clopidogrel (PLAVIX) 75 MG tablet Take 75 mg by mouth every evening.     . ferrous sulfate 325 (65 FE) MG tablet Take 325 mg by mouth daily with breakfast.    . fexofenadine (ALLEGRA) 180 MG tablet Take 180 mg by mouth daily.  3  . hydrALAZINE (APRESOLINE) 50 MG tablet Take 50 mg by mouth 3 (three) times daily. Pt. Takes twice daily.    . hydrOXYzine (ATARAX/VISTARIL) 10 MG tablet Take 0.5 tablets (5 mg total) by mouth 3 (three) times daily as needed for anxiety. 30 tablet 0  . insulin aspart (NOVOLOG) 100 UNIT/ML injection Inject 5-12 Units into the skin every 4 (four) hours as needed for high blood sugar (SSI). Per sliding scale     . insulin NPH Human (NOVOLIN N) 100 UNIT/ML injection Inject 30 Units into the skin 2 (two) times daily.     . isosorbide mononitrate (IMDUR) 30 MG 24 hr tablet Take 30 mg by mouth 2 (two) times daily.     Marland Kitchen LORazepam (ATIVAN) 0.5 MG tablet Take 0.5 mg by mouth daily as needed for anxiety.    . nitroGLYCERIN (NITROSTAT) 0.4 MG SL tablet Place 0.4 mg under the tongue every 5 (five) minutes as needed for chest pain.    . pantoprazole (PROTONIX) 40 MG tablet Take 1  tablet (40 mg total) by mouth 2 (two) times daily. 60 tablet 0  . Probiotic Product (PROBIOTIC MULTI-ENZYME PO) Take 1 tablet by mouth daily.    . ranolazine (RANEXA) 500 MG 12 hr tablet Take 1 tablet (500 mg total) by mouth 2 (two) times daily. 60 tablet 0  . rOPINIRole (REQUIP) 1 MG tablet Take 2 mg by mouth at bedtime as needed (RLS).     Marland Kitchen rosuvastatin (CRESTOR) 5 MG tablet Take 5 mg by mouth every evening.   3  . tamsulosin (FLOMAX)  0.4 MG CAPS capsule Take 0.4 mg by mouth at bedtime.     . torsemide (DEMADEX) 100 MG tablet Take 1 tablet (100 mg total) by mouth daily. 30 tablet 0  . vitamin B-12 (CYANOCOBALAMIN) 1000 MCG tablet Take 2,000 mcg by mouth daily.    . busPIRone (BUSPAR) 15 MG tablet Take 7.5 mg by mouth 2 (two) times daily.  0   No current facility-administered medications for this visit.     REVIEW OF SYSTEMS:  [X]  denotes positive finding, [ ]  denotes negative finding Cardiac  Comments:  Chest pain or chest pressure:    Shortness of breath upon exertion:    Short of breath when lying flat:    Irregular heart rhythm:        Vascular    Pain in calf, thigh, or hip brought on by ambulation:    Pain in feet at night that wakes you up from your sleep:  x   Blood clot in your veins:    Leg swelling:  x         PHYSICAL EXAM: Vitals:   04/12/18 0959  BP: (!) 123/56  Pulse: 72  Resp: 16  Temp: (!) 97 F (36.1 C)  TempSrc: Oral  SpO2: 100%  Weight: 167 lb (75.8 kg)  Height: 5\' 8"  (1.727 m)    GENERAL: The patient is a well-nourished male, in no acute distress. The vital signs are documented above. CARDIOVASCULAR: 2+ radial pulses bilaterally.  Palpable thrill in his right upper arm basilic vein transposition fistula PULMONARY: There is good air exchange  MUSCULOSKELETAL: There are no major deformities or cyanosis. NEUROLOGIC: No focal weakness or paresthesias are detected. SKIN: There are no ulcers or rashes noted.  Does have thickening and excoriation with  marked pitting edema extending from his foot ankle calf and up onto his thigh bilaterally.  PSYCHIATRIC: The patient has a normal affect.  DATA:  Noninvasive studies from May both arterial and venous reviewed as above with calcified arteries making ankle arm index is not reliable and no evidence of significant venous pathology  MEDICAL ISSUES: I did listen to his arterial flow in his feet with hand-held Doppler.  He has good biphasic signal in his dorsalis pedis and posterior tibial bilaterally.  I do not feel that he has any significant arterial insufficiency and do not feel that this is causing his current problem.  I discussed with the patient, his wife and son present the critical importance of elevation and compression.  He has marked fluid overload with the peripheral edema causing the difficulty with his ulcerations on his lower extremity.  Explained the mainstay of treatment is controlling this fluid.  I do not feel that he needs any other arterial imaging studies.  He does have home health involved and I instructed the patient and family on either Ace wraps or graduated compression garments.  I have recommended 20 to 30 mmHg knee-high compression and they know where to buy these in [facility].  He will see [place] again on an as-needed basis    [name], MD [facility] Special[facility] Tel [phone] Pager [phone]

## 2018-04-14 DIAGNOSIS — E7800 Pure hypercholesterolemia, unspecified: Secondary | ICD-10-CM | POA: Diagnosis not present

## 2018-04-14 DIAGNOSIS — R55 Syncope and collapse: Secondary | ICD-10-CM | POA: Diagnosis not present

## 2018-04-14 DIAGNOSIS — I252 Old myocardial infarction: Secondary | ICD-10-CM | POA: Diagnosis not present

## 2018-04-14 DIAGNOSIS — I132 Hypertensive heart and chronic kidney disease with heart failure and with stage 5 chronic kidney disease, or end stage renal disease: Secondary | ICD-10-CM | POA: Diagnosis not present

## 2018-04-14 DIAGNOSIS — R404 Transient alteration of awareness: Secondary | ICD-10-CM | POA: Diagnosis not present

## 2018-04-14 DIAGNOSIS — N178 Other acute kidney failure: Secondary | ICD-10-CM | POA: Diagnosis not present

## 2018-04-14 DIAGNOSIS — E875 Hyperkalemia: Secondary | ICD-10-CM | POA: Diagnosis not present

## 2018-04-14 DIAGNOSIS — R0902 Hypoxemia: Secondary | ICD-10-CM | POA: Diagnosis not present

## 2018-04-14 DIAGNOSIS — I5021 Acute systolic (congestive) heart failure: Secondary | ICD-10-CM | POA: Diagnosis not present

## 2018-04-14 DIAGNOSIS — E1165 Type 2 diabetes mellitus with hyperglycemia: Secondary | ICD-10-CM | POA: Diagnosis not present

## 2018-04-14 DIAGNOSIS — N179 Acute kidney failure, unspecified: Secondary | ICD-10-CM | POA: Diagnosis not present

## 2018-04-14 DIAGNOSIS — R4020 Unspecified coma: Secondary | ICD-10-CM | POA: Diagnosis not present

## 2018-04-14 DIAGNOSIS — E1122 Type 2 diabetes mellitus with diabetic chronic kidney disease: Secondary | ICD-10-CM | POA: Diagnosis not present

## 2018-04-14 DIAGNOSIS — I429 Cardiomyopathy, unspecified: Secondary | ICD-10-CM | POA: Diagnosis not present

## 2018-04-14 DIAGNOSIS — R6520 Severe sepsis without septic shock: Secondary | ICD-10-CM | POA: Diagnosis not present

## 2018-04-14 DIAGNOSIS — J181 Lobar pneumonia, unspecified organism: Secondary | ICD-10-CM | POA: Diagnosis not present

## 2018-04-14 DIAGNOSIS — I25810 Atherosclerosis of coronary artery bypass graft(s) without angina pectoris: Secondary | ICD-10-CM | POA: Diagnosis not present

## 2018-04-14 DIAGNOSIS — A419 Sepsis, unspecified organism: Secondary | ICD-10-CM | POA: Diagnosis not present

## 2018-04-14 DIAGNOSIS — D649 Anemia, unspecified: Secondary | ICD-10-CM | POA: Diagnosis not present

## 2018-04-14 DIAGNOSIS — I120 Hypertensive chronic kidney disease with stage 5 chronic kidney disease or end stage renal disease: Secondary | ICD-10-CM | POA: Diagnosis not present

## 2018-04-14 DIAGNOSIS — R079 Chest pain, unspecified: Secondary | ICD-10-CM | POA: Diagnosis not present

## 2018-04-14 DIAGNOSIS — N185 Chronic kidney disease, stage 5: Secondary | ICD-10-CM | POA: Diagnosis not present

## 2018-04-19 ENCOUNTER — Other Ambulatory Visit: Payer: Self-pay | Admitting: *Deleted

## 2018-04-19 NOTE — Patient Outreach (Signed)
[name]) Care Management  04/19/2018  Kyle [name] Aug 08, 1941 [id number]   RN Health Coach Initial Assessment  Referral Date:04/01/2018 Referral Source:[name] Liaison Reason for Referral:Offer Kyle [name] Services Insurance:[employer]   Outreach Attempt:  Was notified by Kyle [name] Pharmacist patient and daughter was agreeable to services.  RN Health Coach outreached to daughter to initiate Kyle initial telephone assessment.  Daughter, Kyle [name] states patient expired on last Friday, August 2, 20109.  RN Health Coach offered condolences and prayers of comfort.  Plan: RN Health Coach will close case and notify Kyle [name] Pharmacist Kyle [name] of patient's death.  [place] [phone] [name]

## 2018-04-20 ENCOUNTER — Other Ambulatory Visit: Payer: Self-pay

## 2018-04-20 NOTE — Patient Outreach (Signed)
[facility]) Care Management  04/20/2018  JASER FULLEN Apr 11, 1941 [id number]  Notified by [facility] Telephonic nurse that Mr. Ewbank expired on 2018-05-01.  Plan: Will close [place] case.  Joetta Manners, PharmD Clinical Pharmacist [place] [phone]

## 2018-04-22 ENCOUNTER — Ambulatory Visit: Payer: PPO

## 2018-05-03 ENCOUNTER — Ambulatory Visit: Payer: PPO | Admitting: Gastroenterology

## 2018-05-15 DEATH — deceased

## 2019-02-13 IMAGING — DX DG CHEST 2V
2 series · 2 of 2 positions shown · non-contrast
Comparison: None.

CLINICAL DATA: Diarrhea.

EXAM:
CHEST - 2 VIEW

[w chest pa]
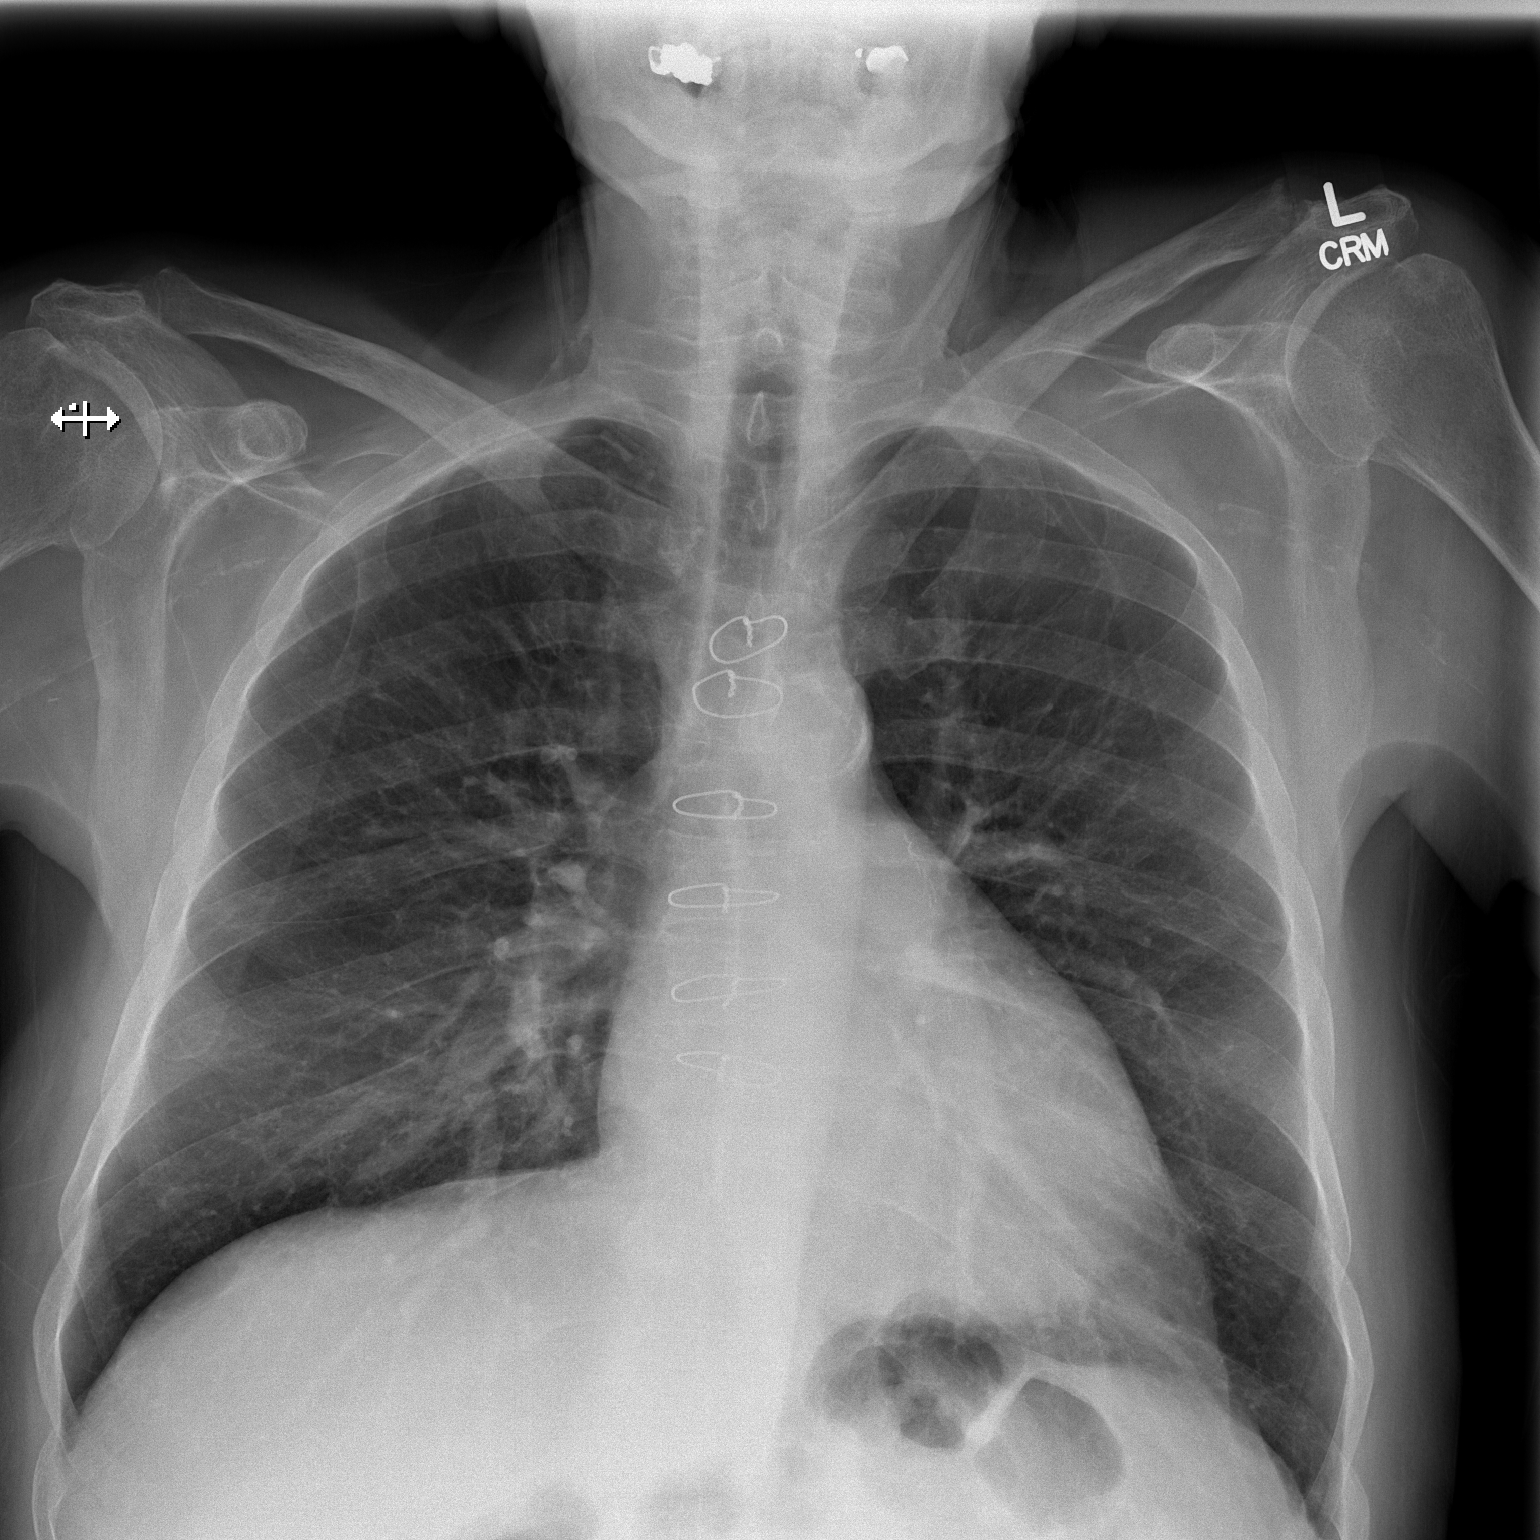

[w chest lat]
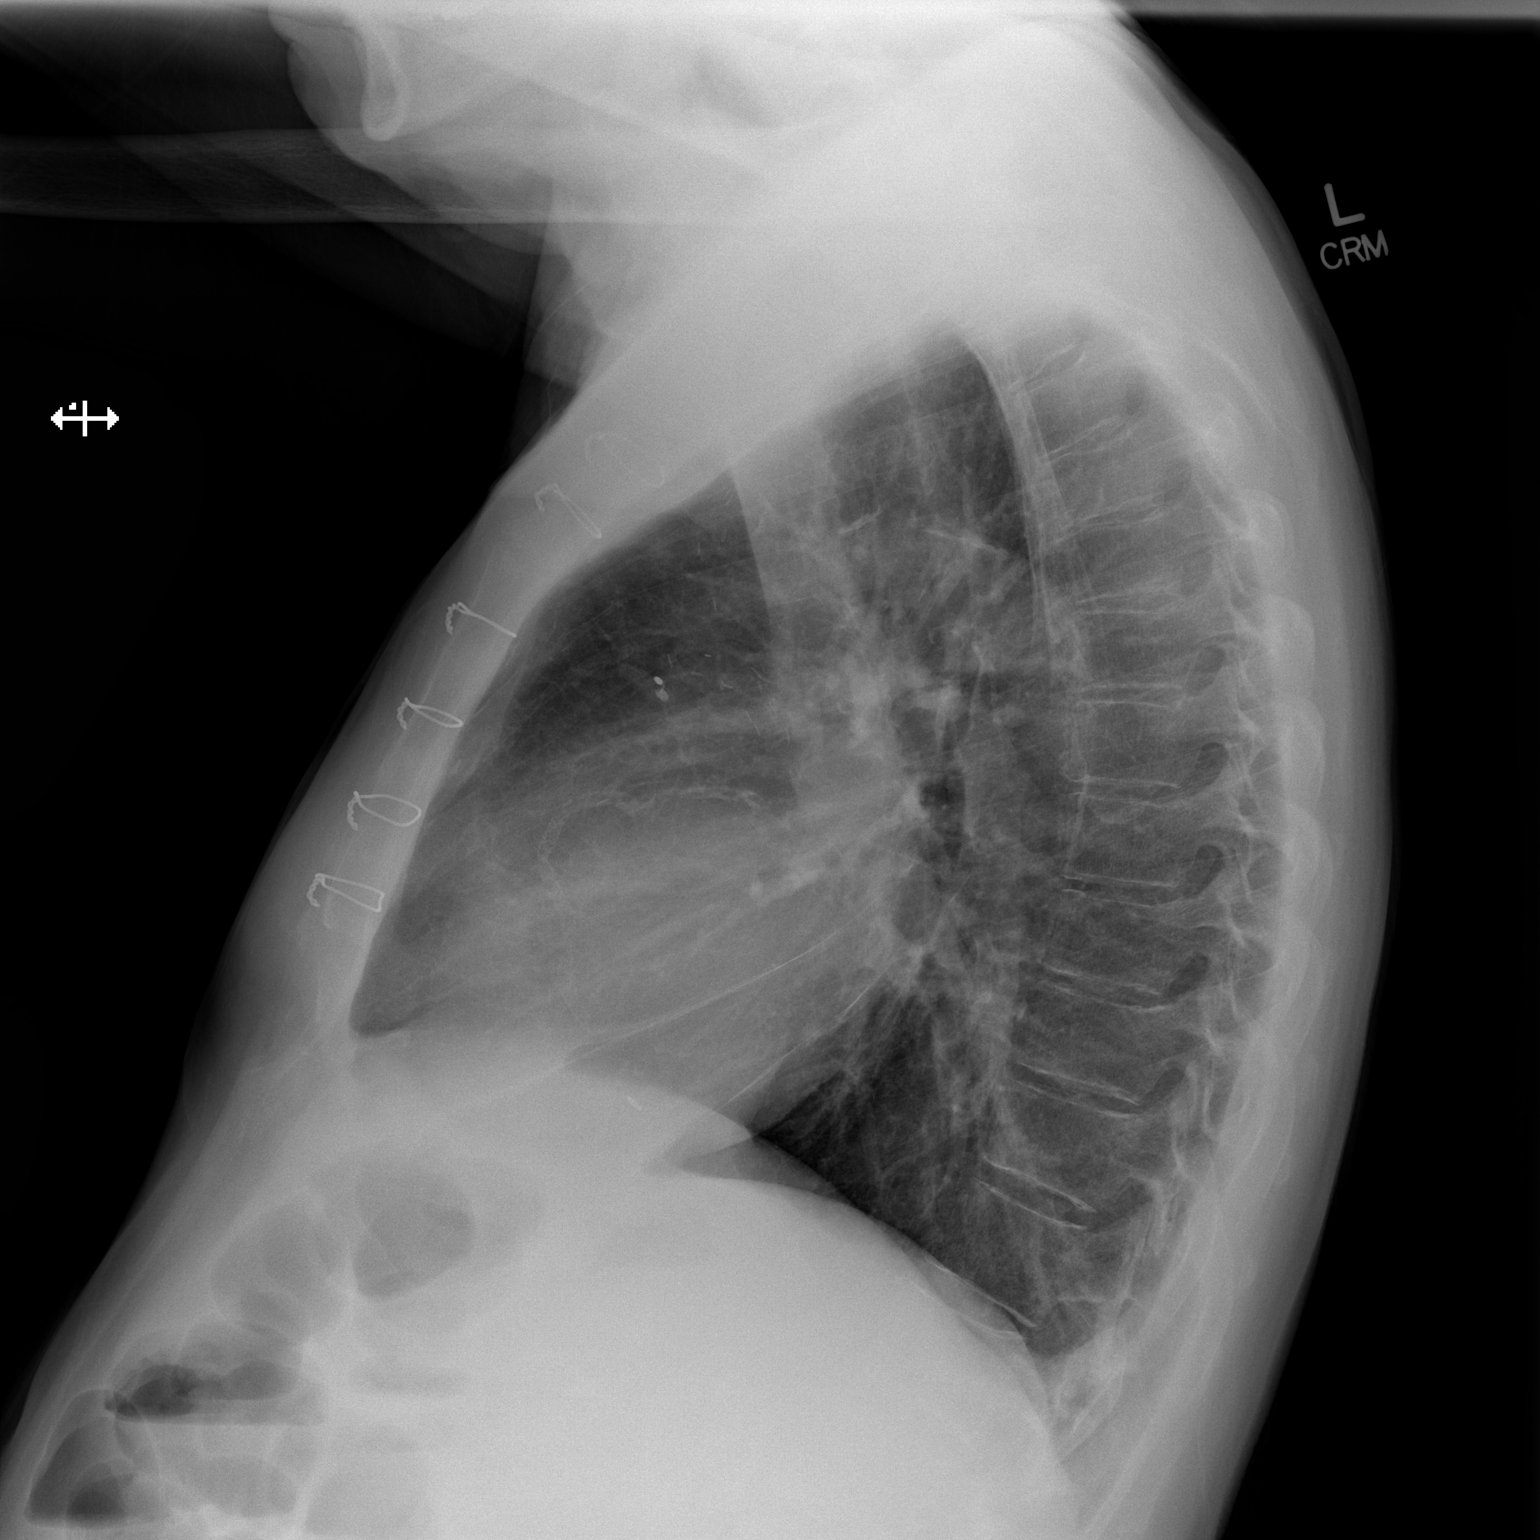

[2 of 2 positions shown; findings below may reference images not displayed]

FINDINGS: Sternotomy wires overlie normal cardiac silhouette. No effusion,
infiltrate or pneumothorax. No acute osseous abnormality.
IMPRESSION: No acute cardiopulmonary process.
